# Patient Record
Sex: Female | Born: 1955
Health system: Southern US, Community
[De-identification: ages and names within clinical notes are randomized; demographics above are authoritative.]

## PROBLEM LIST (undated history)

## (undated) DIAGNOSIS — T4145XA Adverse effect of unspecified anesthetic, initial encounter: Secondary | ICD-10-CM

## (undated) DIAGNOSIS — E559 Vitamin D deficiency, unspecified: Secondary | ICD-10-CM

## (undated) DIAGNOSIS — Z9889 Other specified postprocedural states: Secondary | ICD-10-CM

## (undated) DIAGNOSIS — M1990 Unspecified osteoarthritis, unspecified site: Secondary | ICD-10-CM

## (undated) DIAGNOSIS — T8859XA Other complications of anesthesia, initial encounter: Secondary | ICD-10-CM

## (undated) DIAGNOSIS — R112 Nausea with vomiting, unspecified: Secondary | ICD-10-CM

## (undated) DIAGNOSIS — K219 Gastro-esophageal reflux disease without esophagitis: Secondary | ICD-10-CM

## (undated) DIAGNOSIS — D649 Anemia, unspecified: Secondary | ICD-10-CM

## (undated) DIAGNOSIS — B009 Herpesviral infection, unspecified: Secondary | ICD-10-CM

## (undated) DIAGNOSIS — I10 Essential (primary) hypertension: Secondary | ICD-10-CM

## (undated) HISTORY — PX: OVARY SURGERY: SHX727

## (undated) HISTORY — DX: Vitamin D deficiency, unspecified: E55.9

## (undated) HISTORY — PX: ABDOMINAL HYSTERECTOMY: SHX81

## (undated) HISTORY — PX: EYE SURGERY: SHX253

## (undated) HISTORY — PX: REFRACTIVE SURGERY: SHX103

## (undated) HISTORY — DX: Herpesviral infection, unspecified: B00.9

## (undated) HISTORY — DX: Anemia, unspecified: D64.9

---

## 1988-10-16 DIAGNOSIS — E039 Hypothyroidism, unspecified: Secondary | ICD-10-CM

## 1988-10-16 HISTORY — DX: Hypothyroidism, unspecified: E03.9

## 1998-09-23 ENCOUNTER — Other Ambulatory Visit: Admission: RE | Admit: 1998-09-23 | Discharge: 1998-09-23 | Payer: Self-pay | Admitting: Gynecology

## 1998-10-05 ENCOUNTER — Ambulatory Visit (HOSPITAL_COMMUNITY): Admission: RE | Admit: 1998-10-05 | Discharge: 1998-10-05 | Payer: Self-pay | Admitting: *Deleted

## 1998-10-26 ENCOUNTER — Ambulatory Visit (HOSPITAL_COMMUNITY): Admission: RE | Admit: 1998-10-26 | Discharge: 1998-10-26 | Payer: Self-pay | Admitting: *Deleted

## 1999-05-25 ENCOUNTER — Ambulatory Visit (HOSPITAL_COMMUNITY): Admission: RE | Admit: 1999-05-25 | Discharge: 1999-05-25 | Payer: Self-pay | Admitting: *Deleted

## 1999-06-08 ENCOUNTER — Other Ambulatory Visit: Admission: RE | Admit: 1999-06-08 | Discharge: 1999-06-08 | Payer: Self-pay | Admitting: Gynecology

## 1999-12-09 ENCOUNTER — Ambulatory Visit (HOSPITAL_COMMUNITY): Admission: RE | Admit: 1999-12-09 | Discharge: 1999-12-09 | Payer: Self-pay | Admitting: Gynecology

## 1999-12-09 ENCOUNTER — Encounter: Payer: Self-pay | Admitting: Gynecology

## 2000-01-24 ENCOUNTER — Other Ambulatory Visit: Admission: RE | Admit: 2000-01-24 | Discharge: 2000-01-24 | Payer: Self-pay | Admitting: Gynecology

## 2000-02-15 ENCOUNTER — Ambulatory Visit (HOSPITAL_COMMUNITY): Admission: RE | Admit: 2000-02-15 | Discharge: 2000-02-15 | Payer: Self-pay | Admitting: Gynecology

## 2000-02-15 ENCOUNTER — Encounter (INDEPENDENT_AMBULATORY_CARE_PROVIDER_SITE_OTHER): Payer: Self-pay

## 2000-12-12 ENCOUNTER — Ambulatory Visit (HOSPITAL_COMMUNITY): Admission: RE | Admit: 2000-12-12 | Discharge: 2000-12-12 | Payer: Self-pay | Admitting: Obstetrics and Gynecology

## 2000-12-12 ENCOUNTER — Encounter: Payer: Self-pay | Admitting: Obstetrics and Gynecology

## 2001-04-15 ENCOUNTER — Other Ambulatory Visit: Admission: RE | Admit: 2001-04-15 | Discharge: 2001-04-15 | Payer: Self-pay | Admitting: Obstetrics and Gynecology

## 2001-12-17 ENCOUNTER — Ambulatory Visit (HOSPITAL_COMMUNITY): Admission: RE | Admit: 2001-12-17 | Discharge: 2001-12-17 | Payer: Self-pay | Admitting: Obstetrics and Gynecology

## 2001-12-17 ENCOUNTER — Encounter: Payer: Self-pay | Admitting: Obstetrics and Gynecology

## 2002-04-15 ENCOUNTER — Other Ambulatory Visit: Admission: RE | Admit: 2002-04-15 | Discharge: 2002-04-15 | Payer: Self-pay | Admitting: Obstetrics and Gynecology

## 2002-09-08 ENCOUNTER — Ambulatory Visit (HOSPITAL_COMMUNITY): Admission: RE | Admit: 2002-09-08 | Discharge: 2002-09-08 | Payer: Self-pay | Admitting: Internal Medicine

## 2002-12-24 ENCOUNTER — Encounter: Payer: Self-pay | Admitting: Obstetrics and Gynecology

## 2002-12-24 ENCOUNTER — Ambulatory Visit (HOSPITAL_COMMUNITY): Admission: RE | Admit: 2002-12-24 | Discharge: 2002-12-24 | Payer: Self-pay | Admitting: Obstetrics and Gynecology

## 2003-06-16 ENCOUNTER — Other Ambulatory Visit: Admission: RE | Admit: 2003-06-16 | Discharge: 2003-06-16 | Payer: Self-pay | Admitting: Obstetrics and Gynecology

## 2003-11-05 ENCOUNTER — Ambulatory Visit (HOSPITAL_COMMUNITY): Admission: RE | Admit: 2003-11-05 | Discharge: 2003-11-05 | Payer: Self-pay

## 2003-12-25 ENCOUNTER — Ambulatory Visit (HOSPITAL_COMMUNITY): Admission: RE | Admit: 2003-12-25 | Discharge: 2003-12-25 | Payer: Self-pay | Admitting: Obstetrics and Gynecology

## 2004-01-26 ENCOUNTER — Encounter (INDEPENDENT_AMBULATORY_CARE_PROVIDER_SITE_OTHER): Payer: Self-pay | Admitting: *Deleted

## 2004-01-26 ENCOUNTER — Observation Stay (HOSPITAL_COMMUNITY): Admission: RE | Admit: 2004-01-26 | Discharge: 2004-01-27 | Payer: Self-pay | Admitting: Obstetrics and Gynecology

## 2004-09-21 ENCOUNTER — Encounter: Payer: Self-pay | Admitting: Orthopedic Surgery

## 2004-09-24 ENCOUNTER — Ambulatory Visit (HOSPITAL_COMMUNITY): Admission: RE | Admit: 2004-09-24 | Discharge: 2004-09-24 | Payer: Self-pay | Admitting: Orthopedic Surgery

## 2004-12-02 ENCOUNTER — Ambulatory Visit: Payer: Self-pay

## 2004-12-06 ENCOUNTER — Ambulatory Visit: Payer: Self-pay | Admitting: Cardiology

## 2004-12-19 ENCOUNTER — Ambulatory Visit: Payer: Self-pay | Admitting: Cardiology

## 2004-12-29 ENCOUNTER — Ambulatory Visit (HOSPITAL_COMMUNITY): Admission: RE | Admit: 2004-12-29 | Discharge: 2004-12-29 | Payer: Self-pay | Admitting: Obstetrics and Gynecology

## 2004-12-30 ENCOUNTER — Ambulatory Visit: Payer: Self-pay | Admitting: Cardiology

## 2005-01-09 ENCOUNTER — Encounter: Admission: RE | Admit: 2005-01-09 | Discharge: 2005-01-09 | Payer: Self-pay | Admitting: Obstetrics and Gynecology

## 2005-03-21 ENCOUNTER — Ambulatory Visit: Payer: Self-pay | Admitting: Cardiology

## 2006-01-29 ENCOUNTER — Ambulatory Visit (HOSPITAL_COMMUNITY): Admission: RE | Admit: 2006-01-29 | Discharge: 2006-01-29 | Payer: Self-pay | Admitting: Obstetrics and Gynecology

## 2006-02-12 ENCOUNTER — Ambulatory Visit (HOSPITAL_COMMUNITY): Admission: RE | Admit: 2006-02-12 | Discharge: 2006-02-12 | Payer: Self-pay | Admitting: Gastroenterology

## 2006-02-12 LAB — HM COLONOSCOPY

## 2006-12-07 ENCOUNTER — Encounter: Admission: RE | Admit: 2006-12-07 | Discharge: 2006-12-07 | Payer: Self-pay | Admitting: Family Medicine

## 2007-02-15 ENCOUNTER — Ambulatory Visit (HOSPITAL_COMMUNITY): Admission: RE | Admit: 2007-02-15 | Discharge: 2007-02-15 | Payer: Self-pay | Admitting: Obstetrics and Gynecology

## 2008-02-18 ENCOUNTER — Ambulatory Visit (HOSPITAL_COMMUNITY): Admission: RE | Admit: 2008-02-18 | Discharge: 2008-02-18 | Payer: Self-pay | Admitting: Obstetrics and Gynecology

## 2009-12-28 ENCOUNTER — Ambulatory Visit (HOSPITAL_COMMUNITY): Admission: RE | Admit: 2009-12-28 | Discharge: 2009-12-28 | Payer: Self-pay | Admitting: Gastroenterology

## 2009-12-28 LAB — HM COLONOSCOPY

## 2011-01-02 ENCOUNTER — Inpatient Hospital Stay (INDEPENDENT_AMBULATORY_CARE_PROVIDER_SITE_OTHER)
Admission: RE | Admit: 2011-01-02 | Discharge: 2011-01-02 | Disposition: A | Discharge status: Home / Self Care | Payer: Commercial Managed Care - PPO | Source: Ambulatory Visit | Attending: Emergency Medicine | Admitting: Emergency Medicine

## 2011-01-02 DIAGNOSIS — J40 Bronchitis, not specified as acute or chronic: Secondary | ICD-10-CM

## 2011-01-02 DIAGNOSIS — I10 Essential (primary) hypertension: Secondary | ICD-10-CM

## 2011-01-02 DIAGNOSIS — K5289 Other specified noninfective gastroenteritis and colitis: Secondary | ICD-10-CM

## 2011-03-03 NOTE — Op Note (Signed)
Robin Greene, Robin Greene                            ACCOUNT NO.:  [id number]   MEDICAL RECORD NO.:  [id number]                   PATIENT TYPE:  OBV   LOCATION:  9399                                 FACILITY:  [facility]   PHYSICIAN:  Guy Sandifer. Arleta Creek, M.D.           DATE OF BIRTH:  1956-03-10   DATE OF PROCEDURE:  01/26/2004  DATE OF DISCHARGE:                                 OPERATIVE REPORT   PREOPERATIVE DIAGNOSIS:  Stress urinary incontinence.   POSTOPERATIVE DIAGNOSIS:  Stress urinary incontinence.   PROCEDURES:  1. Pelvilace midurethral sling.  2. Cystoscopy.   SURGEON:  Guy Sandifer. Henderson Cloud, M.D.   ASSISTANT:  Juluis Mire, M.D.   ESTIMATED BLOOD LOSS:  Less than 50 mL.   SPECIMENS:  None.   INDICATIONS AND CONSENT:  This patient is a 55 year old white female with  stress urinary incontinence.  Pelvilace midurethral sling has been discussed  with the patient preoperatively.  The potential risks and complications are  reviewed, including but not limited to infection, organ damage, bleeding  requiring transfusion of blood products with possible transfusion reaction,  HIV and hepatitis acquisition, DVT, PE, pneumonia, recurrent stress  incontinence, prolonged postoperative catheterization and/or prolonged  intermittent self-catheterization, and other causes of incontinence.  All  questions have been answered and consent is signed on the chart.   PROCEDURE:  The patient is in the operating room under general anesthesia  via endotracheal intubation, having just undergone laparoscopically-assisted  vaginal hysterectomy with bilateral salpingo-oophorectomy with Dr. Richardean Chimera.  He completed his case.  The patient is in the dorsal lithotomy  position.  Weighted speculum is placed.  The anterior vaginal mucosa is  grasped under the course of the midurethra, which is injected with 0.5%  lidocaine with epinephrine at 1:200,000 concentration.  The vaginal mucosa  is then incised under  the course of the urethra and dissection is carried  out bilaterally to the point of the urogenital diaphragm and the inferior  pubic ramus bilaterally.  The suprapubic midline is marked with a marking  pen and two incisions are made just above the pubis, one fingerbreadth  lateral to the midline bilaterally.  Then using the Bard needle, the  anterior fascia is penetrated about 1 cm superior to the pubic ramus.  The  needle is then rotated at a 90 degree angle and the back of the pubic ramus  is gently palpated with the tip of the needle while pushing the needle  downward toward the floor until the inferior aspect of the pubic ramus is  located and the needle is then passed under the ramus.  Using the vaginal  finger to guide the needle tip, it is exited through the urogenital  diaphragm lateral to the urethra.  It should be noted that a Foley catheter  is in the bladder prior to placement of the needle as to drain the bladder  and  also to mark the course of the urethra.  A similar procedure is carried  out on the right side.  Cystoscopy is then carried out using the 70 degree  angled cystoscope.  A 360 degree inspection reveals no evidence of bladder  perforation.  There are no foreign bodies in the bladder and a puff is seen  from the ureteral orifices bilaterally.  The cystoscope is removed and the  Foley catheter is replaced.  The Pelvilace sling is then placed on the  needles bilaterally and withdrawn through the suprapubic incisions.  The  sling is placed so that a Kelly clamp can be easily placed under the sling  and rotated perpendicular to the floor without undue tension on the sling,  and a small amount of space is noted between the urethra and the sling.  Excess Pelvilace is then trimmed above the level of the skin bilaterally.  The Foley catheter is again removed.  The cystoscope is placed.  A small  suprapubic midline stab incision is made and a Bonnano catheter is placed   under direct visualization without difficulty.  The stylet is removed and  good positioning of the suprapubic is noted.  The bladder is drained with a  Foley while the suprapubic is stitched in place with a permanent suture.  The vaginal incision is closed in a running locking fashion with 2-0 Rapide  suture.  Dermabond is used to close the suprapubic incision.  All counts  correct.  The patient is stable and taken to the recovery room in stable  condition.                                               Guy Sandifer Arleta Creek, M.D.    JET/MEDQ  D:  01/26/2004  T:  01/26/2004  Job:  [id number]

## 2011-03-03 NOTE — Op Note (Signed)
NAMEZILLA, SHARTZER NO.:  [id number]   MEDICAL RECORD NO.:  [id number]          PATIENT TYPE:  OIB   LOCATION:  2899                         FACILITY:  [facility]   PHYSICIAN:  Dionne Ano. Gramig III, M.D.DATE OF BIRTH:  1956-05-19   DATE OF PROCEDURE:  09/24/2004  DATE OF DISCHARGE:  09/24/2004                                 OPERATIVE REPORT   PREOPERATIVE DIAGNOSIS:  Chronic stenosing tenosynovitis, left first dorsal  compartment.   POSTOPERATIVE DIAGNOSIS:  Chronic stenosing tenosynovitis, left first dorsal  compartment.   OPERATION PERFORMED:  1.  Left first dorsal compartment release.  2.  Abductor pollicis longus and extensor pollicis brevis tenosynovectomy.   SURGEON:  Dionne Ano. Amanda Pea, M.D.   ASSISTANT:  None.   ANESTHESIA:  Local with intravenous sedation.   COMPLICATIONS:  None.   TOURNIQUET TIME:  Less than 30 minutes.   INDICATIONS FOR PROCEDURE:  Ms. Yetta Barre is a very pleasant female who presents  with the above mentioned diagnosis.  I have counseled her in regard to the  risks and benefits of surgery including risks of bleeding, infection,  anesthesia, damage to normal structures and failure of surgery to accomplish  its intended goals of relieving symptoms and restoring function.  She has  failed conservative management in the form of injections, splinting, etc.  and desires to proceed.   DESCRIPTION OF PROCEDURE:  The patient was seen by myself and anesthesia,  taken to the operative suite, underwent a smooth induction of intravenous  sedation and following this, a field block was placed.  Once this was done,  the patient then underwent a thorough prep and drape a second time with  Betadine scrub and paint.  Preoperative Ancef was given.  The arm was  elevated.  Tourniquet was insufflated to 250 mmHg.  An incision was made  less than an inch about the area proximal to the radial styloid dorsal  radially.  Dissection was carried down.  The  superficial radial nerve was  protected and the first dorsal compartment was released off of the dorsal  edge to prevent volar subluxation.  She had a separate compartment for the  abductor pollicis longus tendon and this was released as well decompressing  all of the tendons.  Following this, I then performed a tenosynovectomy  meticulously of the extensor pollicis brevis and the abductor pollicis  longus tendon.  She had marked tenosynovitis which was removed to my  satisfaction.  Following this, I then placed the arm through a range of  motion.  She looked excellent.  There was no locking, popping, catching or  volar subluxation of the tendons. I irrigated copiously with the tourniquet  down, obtained, hemostasis and closed the wound with a subcuticular Prolene  suture.  Steri-Strips were applied and a sterile dressing and thumb spica  split were placed.  The patient tolerated the procedure well.  She  was transferred to recovery room in stable condition.  She will be  discharged home on Vicodin and Robaxin and return to see me in six days and  notify me  if any problems occur.  It has been a pleasure to participate in  the patient's care and we look forward to participating in her operative  recovery.       WMG/MEDQ  D:  09/24/2004  T:  09/25/2004  Job:  [id number]

## 2011-03-03 NOTE — Op Note (Signed)
Robin Greene, Robin Greene                            ACCOUNT NO.:  [id number]   MEDICAL RECORD NO.:  [id number]                   PATIENT TYPE:  AMB   LOCATION:  SDC                                  FACILITY:  [facility]   PHYSICIAN:  Juluis Mire, M.D.                DATE OF BIRTH:  1956-09-19   DATE OF PROCEDURE:  11/05/2003  DATE OF DISCHARGE:                                 OPERATIVE REPORT   PREOPERATIVE DIAGNOSIS:  History of complex endometrial hyperplasia with  abnormal bleeding.   POSTOPERATIVE DIAGNOSIS:  History of complex endometrial hyperplasia with  abnormal bleeding, with evidence of extreme cervical stenosis.   OPERATIVE PROCEDURES:  Cervical dilatation with attempted hysteroscopy.   ANESTHESIA:  General.   ESTIMATED BLOOD LOSS:  Minimal.   PACKS AND DRAINS:  None.   INTRAOPERATIVE BLOOD REPLACED:  None.   COMPLICATIONS:  None.   INDICATIONS:  Dictated in history and physical.   The procedure is as follows:  The patient was taken to the OR and placed in  the supine position. After a satisfactory level of general endotracheal  anesthesia obtained, the patient was placed in the dorsal lithotomy position  using the Allen stirrups.  The perineum and vagina were cleansed out with  Betadine and draped in a sterile field.  A speculum was placed in the  vaginal vault. the cervix was grasped with a single-tooth tenaculum.  A  paracervical block was instituted using 1% Xylocaine.  Attempt at sounding  the uterus was unsuccessful due to the stenotic cervix.  We therefore  proceeded to dilate the ectocervical opening and insert a diagnostic  hysteroscope, trying to identify the cavity.  We did palpate the uterus.  It  was mid- to anterior in position.  Despite the hysteroscopic evaluation, we  could not find a passage.  We attempted probing with the smaller dilators  and the sound, eventually identified what was felt to be a passage, but  repeat hysteroscopy revealed a false  passage.  We were unable to enter the  intrauterine cavity safely.  At this point in time we discontinued the  procedure.  The speculum and single-tooth tenaculum were removed.  The  patient was taken out of the dorsal lithotomy position, once alert and  extubated transferred to the recovery room in good condition.  Sponge,  instrument, and needle count reported as correct by the circulating nurse.                                               Juluis Mire, M.D.    JSM/MEDQ  D:  11/05/2003  T:  11/05/2003  Job:  [id number]

## 2011-03-03 NOTE — Discharge Summary (Signed)
Robin Greene, Robin Greene                            ACCOUNT NO.:  [id number]   MEDICAL RECORD NO.:  [id number]                   PATIENT TYPE:  OBV   LOCATION:  9316                                 FACILITY:  [facility]   PHYSICIAN:  Juluis Mire, M.D.                DATE OF BIRTH:  August 30, 1956   DATE OF ADMISSION:  01/26/2004  DATE OF DISCHARGE:                                 DISCHARGE SUMMARY   ADMITTING DIAGNOSES:  1. History of complex endometrial hyperplasia.  2. Abnormal uterine bleeding.  3. Cervical stenosis.  4. Stress urinary incontinence.   POSTOPERATIVE DIAGNOSES:  1. History of complex endometrial hyperplasia.  2. Abnormal uterine bleeding.  3. Cervical stenosis.  4. Stress urinary incontinence.   OPERATIVE PROCEDURE:  1. Laparoscopic-assisted vaginal hysterectomy with bilateral salpingo-     oophorectomy.  2. Midurethral sling.   For her complete history and physical please see dictated note.   COURSE IN THE HOSPITAL:  The patient underwent above-noted surgery,  postoperatively did well.  Postoperative hemoglobin was 12.5.  Discharged  home on postoperative day  #1.  At that time we had done clamping of the suprapubic.  She voided well  with residuals of approximately 100 mL.  The suprapubic was removed.  She  was tolerating a regular diet and ambulating without difficulty, no active  vaginal bleeding.  The abdominal exam was benign and she was afebrile with  stable vital signs.   COMPLICATIONS:  None encountered during her stay in the hospital.  The  patient was discharged home in stable condition.   DISPOSITION:  The patient to watch for signs of infection, nausea and  vomiting, increasing abdominal pain, or active vaginal bleeding.  She is  instructed to call if she has difficulty voiding.  Discharged home on Tylox  as needed for pain.  She is to avoid heavy lifting, vaginal entrance, or  driving a car.  Reassess in the office in 1 week.                           Juluis Mire, M.D.    JSM/MEDQ  D:  01/27/2004  T:  01/27/2004  Job:  [id number]

## 2011-03-03 NOTE — Op Note (Signed)
NAMELEXIA, VANDEVENDER NO.:  [id number]   MEDICAL RECORD NO.:  [id number]          PATIENT TYPE:  AMB   LOCATION:  ENDO                         FACILITY:  [facility]   PHYSICIAN:  Bernette Redbird, M.D.   DATE OF BIRTH:  02/21/1956   DATE OF PROCEDURE:  02/12/2006  DATE OF DISCHARGE:                                 OPERATIVE REPORT   SURGEON:  Bernette Redbird, M.D.   PROCEDURE:  Upper endoscopy with Savary dilatation of the esophagus under  fluoroscopy.   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   FINDINGS:  Esophageal ring above 5 cm hiatal hernia, dilated to 18 mm.   DESCRIPTION OF PROCEDURE:  The nature, purpose, risks of the procedure have  been discussed with the patient who provided written consent.  Sedation was  Fentanyl 100 mcg and Versed 8 mg IV without arrhythmias or desaturation.  The Olympus small-caliber adult video endoscope was passed under direct  vision.  The vocal cords appeared normal.  The esophagus was readily  entered.  The esophageal mucosa was entirely normal, specifically without  evidence of reflux esophagitis, free reflux, Barrett's esophagus, varices,  infection or neoplasia.  At the squamocolumnar junction was a widely patent  esophageal mucosal ring which offered no resistance to passage of the scope.  The ring was at 35 cm and the diaphragmatic hiatus was at 40 cm, showing a 5-  cm hiatal hernia.  The stomach was normal without evidence of gastritis,  erosions, ulcers, polyps or masses; including a retroflexed view of the  cardia and the pylorus, duodenal bulb, and second duodenum looked normal.   Savary dilatation was then performed in the standard fashion.  The spring  tip guidewire was passed through the scope into the antrum of the stomach  and the scope was removed and in an exchange fashion, after which  fluoroscopy was used to confirm appropriate positioning of the wire.   A  single passage was made with an 18 mm Savary dilator, encountering mild  resistance at the level of the ring.  Fluoroscopy was used to confirm  passage; otherwise a portion of the dilator below the level of the  diaphragm.  The patient was then re-endoscoped under direct vision which  showed some fresh hemorrhage right at the ring, consistent with a fracture,  although there was no evidence of any severe mucosal disruption,  perforation, or active bleeding or undue trauma to the esophagus or  hypopharynx during removal the scope.  The patient tolerated the procedure  well and there no apparent complications.   IMPRESSION:  Esophageal ring dilated to 18 mm as described above, for  alleviation of dysphagia symptoms (530.3, 787.2).   PLAN:  Clinical followup of dysphagia symptoms.  Proceed to screening  colonoscopy.           ______________________________  Bernette Redbird, M.D.     RB/MEDQ  D:  02/12/2006  T:  02/12/2006  Job:  [id number]   cc:   Juluis Mire, M.D.  Fax: [phone]  Lavonda Jumbo, M.D.  Fax: [phone]

## 2011-03-03 NOTE — H&P (Signed)
NAME:  Robin Greene, Robin Greene                            ACCOUNT NO.:  [id number]   MEDICAL RECORD NO.:  [id number]                   PATIENT TYPE:  AMB   LOCATION:  SDC                                  FACILITY:  [facility]   PHYSICIAN:  Juluis Mire, M.D.                DATE OF BIRTH:  [birth year]   DATE OF ADMISSION:  11/05/2003  DATE OF DISCHARGE:                                HISTORY & PHYSICAL   HISTORY OF PRESENT ILLNESS:  The patient is a 55 year old gravida 2, para 0,  abortus 2 female who presents for a hysteroscopy.   In relation to the present admission, the patient has a history of  anovulatory cycling.  She had to discontinue birth control pills due to her  hypertension.  She had had a previous hysteroscopic evaluation by Dr. Dimas Aguas  C. Mezer.  They did find focal complex hyperplasia without atypia.  Subsequent ultrasound in 2001 had revealed minimal endometrial stripe and  thickness.  Due to a fibroid in the lower cervical area, endometrial  samplings in the office have been difficult and we have been unable to  perform that.  We went ahead and repeated a saline infusion ultrasound; she  did have a thickened endometrium.  Because of the cervical issues, we were  unable to pass a catheter and the patient now presents for a hysteroscopic  evaluation.   ALLERGIES:  In terms of allergies, she is allergic to ASPIRIN.   MEDICATIONS:  Medications include:  1. Synthroid 125 mcg each day.  2. Zyrtec 10 mg daily.  3. Lipitor 10 mg daily.   PAST MEDICAL HISTORY:  Past medical history does reveal a history of mild  hypertension.  She also has hyperlipidemia for which she is on medication,  as noted, otherwise, usual childhood diseases without any significant  sequelae.   PREVIOUS SURGICAL HISTORY:  In 1990, she had a miscarriage with D&C, in  1994,  she had a miscarriage with D&C and in 2001, she had a hysteroscopy  with D&C.   FAMILY HISTORY:  There is a history of diabetes in maternal  and paternal  grandparents.  Father has a history of hypertension.   SOCIAL HISTORY:  No tobacco or alcohol use.   REVIEW OF SYSTEMS:  Review of systems is noncontributory.   PHYSICAL EXAMINATION:  VITAL SIGNS:  The patient is afebrile with stable  vital signs.  HEENT:  The patient is normocephalic.  Pupils are equal, round and reactive  to light and accommodation.  Extraocular movements were intact.  Sclerae and  conjunctivae were clear.  Oropharynx was clear.  NECK:  Neck without thyromegaly.  BREASTS:  No discrete masses.  LUNGS:  Lungs clear.  CARDIOVASCULAR SYSTEM:  Regular rate with no murmurs or gallops.  ABDOMEN:  Abdominal exam is benign with no mass, organomegaly or tenderness.  PELVIC:  Normal external genitalia.  Vaginal mucosa is  clear.  Cervix  unremarkable.  Uterus normal size, shape and contour.  Adnexa free of mass  or tenderness.  EXTREMITIES:  Trace edema.  NEUROLOGIC:  Exam is grossly within normal limits.   BASIC IMPRESSION:  History of complex hyperplasia and anovulatory cycle,  rule out endometrial pathology.   PLAN:  The patient will undergo hysteroscopic evaluation of the endometrium  and biopsy.  The risks of surgery have been discussed including the risks of  infection, the risk of hemorrhage that could necessitate transfusion, the  risk of injury to adjacent organs including bladder or bowel that could  require further exploratory surgery, risks of deep venous thrombosis and  pulmonary embolus; patient expressed understanding of indications and risks.                                               Juluis Mire, M.D.    JSM/MEDQ  D:  11/05/2003  T:  11/05/2003  Job:  [id number]

## 2011-03-03 NOTE — Op Note (Signed)
Robin Greene, Robin Greene                            ACCOUNT NO.:  [id number]   MEDICAL RECORD NO.:  [id number]                   PATIENT TYPE:  OBV   LOCATION:  9316                                 FACILITY:  [facility]   PHYSICIAN:  Juluis Mire, M.D.                DATE OF BIRTH:  November 07, 1955   DATE OF PROCEDURE:  01/26/2004  DATE OF DISCHARGE:                                 OPERATIVE REPORT   PREOPERATIVE DIAGNOSES:  1. Abnormal uterine bleeding.  2. Complex cystic hyperplasia.  3. Cervical stenosis with inability to sample the uterine lining.  4. Stress urinary incontinence.   OPERATIVE PROCEDURE:  Laparoscopically-assisted vaginal hysterectomy with  bilateral salpingo-oophorectomy .   SURGEON:  Juluis Mire, M.D.   ASSISTANT:  Guy Sandifer. Henderson Cloud, M.D.   ANESTHESIA:  General endotracheal.   ESTIMATED BLOOD LOSS:  300-400 mL.   PACKS AND DRAINS:  None.   INTRAOPERATIVE BLOOD REPLACED:  None.   COMPLICATIONS:  None.   INDICATIONS:  Dictated in the history and physical.   The procedure was as follows:  The patient was taken to the OR and placed in  the supine position.  After a satisfactory level of general endotracheal  anesthesia obtained, the patient was placed in the dorsal lithotomy position  using Allen stirrups.  The abdomen, perineum, and vagina were prepped out  with Betadine.  The bladder was emptied by in-and-out catheterization.  A  Hulka tenaculum was put in place.  The patient then draped out for a  laparoscopy.  A subumbilical incision made with a knife.  The fascia was  identified and entered sharply.  The incision in the fascia extended  laterally.  The rectus muscles were separated in the midline.  The  peritoneum was entered sharply.  Palpation revealed no adhesions around the  umbilical opening.  The open laparoscopic trocar was put in place and  secured.  The laparoscope was introduced.  There was no evidence of injury  to adjacent organs.  The appendix was  normal.  Upper abdomen, including  liver and tip of the gallbladder, were normal.  Five millimeter trocars put  in place under direct visualization.  The uterus was elevated.  She did have  a small subserosal fibroid.  Tubes and ovaries were otherwise unremarkable  with no active pelvic processes noticed.  She did have one little implant of  endometriosis below the right uterosacral ligament.  First the right ovary  and tube were elevated.  The ovarian vasculature was identified.  The ureter  was identified and well below the ovarian vasculature.  Using the Gyrus  bipolar, the ovarian vasculature was cauterized and incised.  The mesenteric  attachments of the ovary were cauterized and incised.  The right round  ligament was cauterized and incised and part of the right broad ligament was  cauterized and incised.  We had good freeing of the  right side of the  uterus.  We then went to the left side, where the left tube and ovary were  elevated.  Again the ureter was identified.  The left ovarian vasculature  was isolated above the ureter, cauterized, and incised.  The mesenteric  attachments of the ovary were cauterized and incised.  The left round  ligament was cauterized and incised.  The upper part of the broad ligament  was cauterized and incised.  We had good separation bilaterally with no  active bleeding.   The abdomen was deflated of its carbon dioxide.  The laparoscope was  removed.  The patient's legs were repositioned.  The Hulka tenaculum was  then removed.  A weighted speculum was placed in the vaginal vault.  The  cervix was grasped with a Christella Hartigan tenaculum.  The cul-de-sac was entered  sharply.  Both uterosacral ligaments were clamped, cut, and suture ligated  with 0 Vicryl.  The reflection of the vaginal mucosa anteriorly was incised  using the bipolar.  The bladder was dissected superiorly.  Paracervical  tissue was clamped, cut, and suture ligated with 0 Vicryl.  The   vesicouterine space was identified and entered sharply.  The retractor was  put in place to retract the bladder superiorly.  Using the clamp, cut, and  tie technique with suture ligature of 0 Vicryl, the parametrium was serially  separated from the sides of the uterus.  The uterus was then flipped and  remaining pedicles were clamped and cut and the uterus, tubes, and ovaries  were passed off the operative field.  The held pedicles were secured with  free ties of 0 Vicryl.  We had some fairly active bleeding coming from the  left side of the vaginal cuff.  The vessel was isolated and brought under  control with two figure-of-eights of 0 Vicryl.  Similar bleeding was noted  on the posterior cuff, brought under control with a figure-of-eight of 0  Vicryl.  With this we had good hemostasis.  The vaginal mucosa was  reapproximated in the midline with interrupted figure-of-eights of 0 Vicryl.  We had good approximation.  All held sutures were cut.  The bladder was  emptied by straight drainage with retrieval of an adequate amount of clear  urine.   A sponge on a sponge stick was placed in the vaginal vault.  The patient's  legs were repositioned.  The abdomen was reinflated with carbon dioxide.  Visualization revealed excellent hemostasis.  There was no bleeding at the  cuff or either ovarian pedicle.  We irrigated the pelvis, irrigation  removed.  The abdomen was deflated of its carbon dioxide, all trocars  removed.  The subumbilical fascia was closed with two figure-of-eights of 0  Vicryl, skin with interrupted subcuticulars of 4-0 Vicryl.  The suprapubic  incision was closed with Dermabond.  Sponge, instrument, and needle count  reported as correct by the circulating nurse x2.  At this point in time Dr.  Henderson Cloud came in to complete the midurethral sling.                                               Juluis Mire, M.D.   JSM/MEDQ  D:  01/26/2004  T:  01/26/2004  Job:  [id number]

## 2011-03-03 NOTE — Op Note (Signed)
NAMEKAYSEA, RAYA NO.:  [id number]   MEDICAL RECORD NO.:  [id number]          PATIENT TYPE:  AMB   LOCATION:  ENDO                         FACILITY:  [facility]   PHYSICIAN:  Bernette Redbird, M.D.   DATE OF BIRTH:  [birth year]   DATE OF PROCEDURE:  02/12/2006  DATE OF DISCHARGE:                                 OPERATIVE REPORT   PROCEDURE:  Colonoscopy.   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   FINDINGS:  Normal exam to the terminal ileum.   PROCEDURE:  The nature, purpose, and risks of the procedure had been  discussed with the patient who provided written consent.  Sedation for this  procedure and the upper endoscopy which preceded it totaled fentanyl 125 mcg  and Versed 10 mg IV without arrhythmias or desaturation.  The Olympus adult  adjustable tension video colonoscope was advanced without significant  difficulty to the terminal ileum and pullback was then performed.  There was  some adherent stool in the proximal colon but this was able to be rinsed off  with complete cleaning of the mucosa so it is felt that, essentially, all  areas were well seen.   This was a normal examination.  No polyps, cancer, colitis, vascular  malformations, or diverticulosis were noted and retroflexion of the rectum;  and reinspection of the rectum were normal.  No biopsies were obtained.  The  patient tolerated the procedure well and there no apparent complications.   IMPRESSION:  Family history of colon polyps, without worrisome findings on  current examination.   PLAN:  Repeat colonoscopy in 5 years.           ______________________________  Bernette Redbird, M.D.     RB/MEDQ  D:  02/12/2006  T:  02/12/2006  Job:  [id number]   cc:   Juluis Mire, M.D.  Fax: [phone]   Lavonda Jumbo, M.D.  Fax: [phone]

## 2011-03-03 NOTE — H&P (Signed)
NAME:  Robin Greene, Robin Greene                            ACCOUNT NO.:  [id number]   MEDICAL RECORD NO.:  [id number]                   PATIENT TYPE:  OBV   LOCATION:  NA                                   FACILITY:  [facility]   PHYSICIAN:  Juluis Mire, M.D.                DATE OF BIRTH:  09/20/1956   DATE OF ADMISSION:  01/26/2004  DATE OF DISCHARGE:                                HISTORY & PHYSICAL   Patient is a 55 year old gravida 2, para 0, abortus 2 female presents for  laparoscopic assisted vaginal hysterectomy with bilateral salpingo-  oophorectomy as well as midurethral sling.   IN RELATION TO PRESENT ADMISSION:  Patient has a long-standing history of  anovulatory cycling.  She had discontinued birth control pills due to  hypertension.  She had her previous hysteroscopic evaluation by Dr. Chevis Pretty.  They did find complex hyperplasia but no atypia.  Subsequent ultrasound 2001  had revealed minimal endometrial stripe and thickness.  Due to a fibroid in  the lower cervical area endometrial sampling in the office has been  difficult.  We did do a saline infusion ultrasound that revealed thickened  endometrium.  Subsequently we had attempted hysteroscopy on November 05, 2003.  Despite numerous attempts we could not obtain cervical dilatation and  passage of the hysteroscope.  In view of her past history of complex  hyperplasia and inability to sample endometrium at the present time the  patient now presents for laparoscopic assisted vaginal hysterectomy with  bilateral salpingo-oophorectomy.   The patient also has urinary incontinence.  Workup in the office was  consistent with stress urinary incontinence.  She is going to have a  midurethral sling performed by Dr. Henderson Cloud.   ALLERGIES:  She is allergic to ASPIRIN.   MEDICATIONS:  1. Synthroid 125 mcg each day.  2. Zyrtec 10 mg a day.  3. Lipitor 10 mg daily.   PAST MEDICAL HISTORY:  Does reveal a history of mild hypertension.  She has  also hyperlipidemia for which she is on medication.  Also hypothyroidism.   PAST SURGICAL HISTORY:  In 1990 she had a miscarriage with D&C, in 1994 she  had a miscarriage with D&C, in 2001 she had a hysteroscopy and D&C and as  noted earlier this year attempted hysteroscopy.   FAMILY HISTORY:  She has a family history of a diabetes in maternal and  paternal grandparents.  Father has a history of hypertension.   SOCIAL HISTORY:  No tobacco or alcohol use.   REVIEW OF SYSTEMS:  Noncontributory.   PHYSICAL EXAMINATION:  VITALS:  Patient is afebrile with stable vital signs.  HEENT:  Patient normocephalic; pupils equal, round, and reactive to light  and accommodation; extraocular movements are intact; sclerae and  conjunctivae clear; oropharynx clear.  NECK:  Without thyromegaly.  BREASTS:  No discrete masses.  LUNGS:  Clear.  CARDIOVASCULAR SYSTEM:  Regular  rhythm and rate.  No murmurs or gallop.  ABDOMEN:  Benign.  No masses, organomegaly, or tenderness.  PELVIC:  Normal external genitalia; vaginal mucosa is clear; cervix  unremarkable; uterus feels to be of normal size, shape, and contour; adnexa  free of masses or tenderness; rectovaginal exam is clear.  EXTREMITIES:  Trace edema.  NEUROLOGIC:  Grossly within normal limits.   IMPRESSION:  1. Anovulatory bleeding with a history of complex hyperplasia with     associated cervical stenosis and unable to sample endometrium.     Associated continued abnormal bleeding.  2. Stress urinary incontinence.   PLAN:  The patient will undergo laparoscopic assisted vaginal hysterectomy  with bilateral salpingo-oophorectomy and midurethral sling.  The risks of  surgery have been discussed including the risk of infection.  The risk of  hemorrhage that could require transfusion.  The risk of AIDS or hepatitis.  Risk of injury to adjacent organs including bladder, bowel, ureters that  could require further exploratory surgery.  Risk of deep venous  thrombosis  and pulmonary embolus.  We are going to remove the ovaries, potential need  for hormone replacement therapy and its risks and benefits have been  discussed.  In terms of the midurethral sling success rates of 85-90% have  been discussed.  Possible need for long-term catheterizations has been  discussed.  There may be the need for taking down the sling somewhat to  provide ability to void.  Risks of sling include also possible bladder  perforation with associated fistula formation.                                               Juluis Mire, M.D.    JSM/MEDQ  D:  01/26/2004  T:  01/26/2004  Job:  [id number]

## 2011-03-16 ENCOUNTER — Other Ambulatory Visit (HOSPITAL_COMMUNITY): Payer: Self-pay | Admitting: Obstetrics and Gynecology

## 2011-03-16 DIAGNOSIS — Z1231 Encounter for screening mammogram for malignant neoplasm of breast: Secondary | ICD-10-CM

## 2011-03-23 ENCOUNTER — Encounter: Payer: 59 | Attending: Internal Medicine | Admitting: Dietician

## 2011-03-23 DIAGNOSIS — E119 Type 2 diabetes mellitus without complications: Secondary | ICD-10-CM | POA: Insufficient documentation

## 2011-03-23 DIAGNOSIS — Z713 Dietary counseling and surveillance: Secondary | ICD-10-CM | POA: Insufficient documentation

## 2011-03-23 DIAGNOSIS — I10 Essential (primary) hypertension: Secondary | ICD-10-CM | POA: Insufficient documentation

## 2011-03-23 DIAGNOSIS — E039 Hypothyroidism, unspecified: Secondary | ICD-10-CM | POA: Insufficient documentation

## 2011-03-27 ENCOUNTER — Ambulatory Visit (HOSPITAL_COMMUNITY)
Admission: RE | Admit: 2011-03-27 | Discharge: 2011-03-27 | Disposition: A | Discharge status: Home / Self Care | Payer: Commercial Managed Care - PPO | Source: Ambulatory Visit | Attending: Obstetrics and Gynecology | Admitting: Obstetrics and Gynecology

## 2011-03-27 DIAGNOSIS — Z1231 Encounter for screening mammogram for malignant neoplasm of breast: Secondary | ICD-10-CM | POA: Insufficient documentation

## 2011-12-03 ENCOUNTER — Emergency Department (HOSPITAL_COMMUNITY)
Admission: EM | Admit: 2011-12-03 | Discharge: 2011-12-03 | Disposition: A | Discharge status: Home / Self Care | Payer: 59 | Source: Home / Self Care | Attending: Family Medicine | Admitting: Family Medicine

## 2011-12-03 ENCOUNTER — Encounter (HOSPITAL_COMMUNITY): Payer: Self-pay

## 2011-12-03 DIAGNOSIS — R197 Diarrhea, unspecified: Secondary | ICD-10-CM

## 2011-12-03 HISTORY — DX: Essential (primary) hypertension: I10

## 2011-12-03 LAB — POCT I-STAT, CHEM 8
BUN: 15 mg/dL (ref 6–23)
HCT: 48 % — ABNORMAL HIGH (ref 36.0–46.0)
Potassium: 4.6 mEq/L (ref 3.5–5.1)
Sodium: 137 mEq/L (ref 135–145)
TCO2: 22 mmol/L (ref 0–100)

## 2011-12-03 MED ORDER — LOPERAMIDE HCL 2 MG PO CAPS: 2.0000 mg | ORAL_CAPSULE | Freq: Four times a day (QID) | ORAL | Status: AC | PRN

## 2011-12-03 MED ORDER — ONDANSETRON HCL 4 MG PO TABS: 4.0000 mg | ORAL_TABLET | Freq: Three times a day (TID) | ORAL | Status: AC | PRN

## 2011-12-03 MED ORDER — CIPROFLOXACIN HCL 500 MG PO TABS: 500.0000 mg | ORAL_TABLET | Freq: Two times a day (BID) | ORAL | Status: AC

## 2011-12-03 NOTE — Discharge Instructions (Signed)
Keep well hydrated using hydration salts mixed with water.  Take the prescribed medications as instructed. Advance your diet as tolerated as per instructions in handout Check your blood sugar daily. Can also take over-the-counter Prilosec 20 mg daily to protect her stomach until you get back to normal diet  Return to the emergency department if new symptoms like fever or worsening diarrhea associated with vomiting, abdominal pain or not keeping fluids down.

## 2011-12-03 NOTE — ED Notes (Signed)
5 day hx of diarrhea.  Right before she has diarrhea, pt. will have severe abdominal cramps.  Some nausea. Has 58 year old grandson that has had same symtoms.  Pt. Stated that when she burps it smells very bad.

## 2011-12-04 NOTE — ED Provider Notes (Signed)
History     CSN: [id number]  Arrival date & time 12/03/11  1054   First MD Initiated Contact with Patient 12/03/11 1221      Chief Complaint  Patient presents with  . Diarrhea    5 day hx of diarrhea.  Right before she has diarrhea, pt. will have severe abdominal cramps.  Some nausea. Has 58 year old grandson that has had same symtoms.      (Consider location/radiation/quality/duration/timing/severity/associated sxs/prior treatment) HPI Comments: 56 y/o female with h/o DM here c/o liquid diarrhea for 5 days about 10 per day liquid with no blood or pus, associated with cramps just before the bowel movements. Denies fever or rash, has felt nausea but no vomiting. 3 y/o grandson with similar symptoms. Had cramps in legs yesterday. Decreased solid intake drinking fluid well. No abdominal pain here. No dizziness.      Past Medical History  Diagnosis Date  . Hypertension   . Diabetes mellitus     Past Surgical History  Procedure Date  . Ovary surgery   . Abdominal hysterectomy     History reviewed. No pertinent family history.  History  Substance Use Topics  . Smoking status: Never Smoker   . Smokeless tobacco: Never Used  . Alcohol Use: Yes     occational wine    OB History    Grav Para Term Preterm Abortions TAB SAB Ect Mult Living                  Review of Systems  Constitutional: Negative for fever and chills.  HENT: Positive for congestion.   Respiratory: Positive for cough. Negative for chest tightness and shortness of breath.   Cardiovascular: Negative for chest pain, palpitations and leg swelling.  Gastrointestinal: Positive for vomiting and diarrhea. Negative for abdominal pain, constipation, blood in stool and anal bleeding.  Genitourinary: Negative for dysuria, frequency, hematuria, flank pain, vaginal bleeding, vaginal discharge and pelvic pain.  Musculoskeletal: Negative for arthralgias.  Skin: Negative for rash.  Neurological: Negative for dizziness  and headaches.    Allergies  Review of patient's allergies indicates no known allergies.  Home Medications   Current Outpatient Rx  Name Route Sig Dispense Refill  . ATORVASTATIN CALCIUM 20 MG PO TABS Oral Take 20 mg by mouth daily.    . OMEGA-3 FATTY ACIDS 1000 MG PO CAPS Oral Take 2 g by mouth daily.    Marland Kitchen GLUCOSE BLOOD VI STRP Subcutaneous Inject 1 each into the skin as needed. Use as instructed    . LEVOTHYROXINE SODIUM 200 MCG PO TABS Oral Take 200 mcg by mouth daily.    Marland Kitchen LISINOPRIL-HYDROCHLOROTHIAZIDE 20-25 MG PO TABS Oral Take 1 tablet by mouth daily.    Marland Kitchen METFORMIN HCL 500 MG PO TABS Oral Take 500 mg by mouth 2 (two) times daily with a meal.    . CIPROFLOXACIN HCL 500 MG PO TABS Oral Take 1 tablet (500 mg total) by mouth every 12 (twelve) hours. 14 tablet 0  . LOPERAMIDE HCL 2 MG PO CAPS Oral Take 1 capsule (2 mg total) by mouth 4 (four) times daily as needed for diarrhea or loose stools. 12 capsule 0  . ONDANSETRON HCL 4 MG PO TABS Oral Take 1 tablet (4 mg total) by mouth every 8 (eight) hours as needed for nausea. 10 tablet 0    BP 157/92  Pulse 105  Temp(Src) 97.6 F (36.4 C) (Oral)  Resp 18  SpO2 98%  Physical Exam  Nursing note  and vitals reviewed. Constitutional: She is oriented to person, place, and time. She appears well-developed and well-nourished. No distress.  HENT:  Head: Normocephalic and atraumatic.  Mouth/Throat: Oropharynx is clear and moist. No oropharyngeal exudate.       Dry lips  Eyes: Conjunctivae and EOM are normal. Pupils are equal, round, and reactive to light. No scleral icterus.  Neck: Neck supple.  Cardiovascular: Normal heart sounds and intact distal pulses.   Pulmonary/Chest: Breath sounds normal.  Abdominal: Soft. Bowel sounds are normal. She exhibits no distension and no mass. There is no rebound and no guarding.       diffused tenderness with palpation.   Lymphadenopathy:    She has no cervical adenopathy.  Neurological: She is alert  and oriented to person, place, and time.  Skin: Skin is warm. No rash noted.    ED Course  Procedures (including critical care time)  Labs Reviewed  POCT I-STAT, CHEM 8 - Abnormal; Notable for the following:    Glucose, Bld 162 (*)    Hemoglobin 16.3 (*)    HCT 48.0 (*)    All other components within normal limits  LAB REPORT - SCANNED   No results found.   1. Diarrhea       MDM  Diabetic with secretory type diarrhea for 5 days. Risk for dehydration. Likely viral although decided to treat with cipro, loperamide, ondansetron and encouraged hydration.        Sharin Grave, MD 12/05/11 1429

## 2012-04-04 ENCOUNTER — Other Ambulatory Visit (HOSPITAL_COMMUNITY): Payer: Self-pay | Admitting: Obstetrics and Gynecology

## 2012-04-04 DIAGNOSIS — Z1231 Encounter for screening mammogram for malignant neoplasm of breast: Secondary | ICD-10-CM

## 2012-04-29 ENCOUNTER — Ambulatory Visit (HOSPITAL_COMMUNITY)
Admission: RE | Admit: 2012-04-29 | Discharge: 2012-04-29 | Disposition: A | Discharge status: Home / Self Care | Payer: 59 | Source: Ambulatory Visit | Attending: Obstetrics and Gynecology | Admitting: Obstetrics and Gynecology

## 2012-04-29 DIAGNOSIS — Z1231 Encounter for screening mammogram for malignant neoplasm of breast: Secondary | ICD-10-CM | POA: Insufficient documentation

## 2012-11-22 ENCOUNTER — Ambulatory Visit (INDEPENDENT_AMBULATORY_CARE_PROVIDER_SITE_OTHER): Payer: Self-pay | Admitting: Family Medicine

## 2012-11-22 DIAGNOSIS — E119 Type 2 diabetes mellitus without complications: Secondary | ICD-10-CM

## 2012-11-22 NOTE — Progress Notes (Signed)
Patient presents today for 3 month DM follow-up as part of the employer-sponsored Link to Wellness program. Medications and glucose readings have been reviewed. I have also discussed with patient lifestyle interventions such as healthy eating choices and physical activity. Details of this visit can be found in Care Tracker documenting program available through [facility] ([facility]). Patient has set a series of personal goals and will follow-up in 3 months for further review of DM.  

## 2012-12-09 DIAGNOSIS — E119 Type 2 diabetes mellitus without complications: Secondary | ICD-10-CM | POA: Insufficient documentation

## 2012-12-09 NOTE — Progress Notes (Signed)
ATTENDING PHYSICIAN NOTE: I have reviewed the chart and agree with the plan as detailed above. Prestyn Stanco MD Pager [phone]  

## 2013-02-07 ENCOUNTER — Ambulatory Visit (INDEPENDENT_AMBULATORY_CARE_PROVIDER_SITE_OTHER): Payer: Self-pay | Admitting: Family Medicine

## 2013-02-07 VITALS — BP 151/86 | HR 79 | Ht 65.0 in | Wt 193.0 lb

## 2013-02-07 DIAGNOSIS — E1169 Type 2 diabetes mellitus with other specified complication: Secondary | ICD-10-CM | POA: Insufficient documentation

## 2013-02-07 DIAGNOSIS — E119 Type 2 diabetes mellitus without complications: Secondary | ICD-10-CM
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
## 2013-02-17 NOTE — Progress Notes (Signed)
Patient ID: Robin Greene, female   DOB: 26-Aug-1956, 57 y.o.   MRN: [id number] ATTENDING PHYSICIAN NOTE: I have reviewed the chart and agree with the plan as detailed above. Denny Levy MD Pager [phone]

## 2013-04-30 ENCOUNTER — Other Ambulatory Visit (HOSPITAL_COMMUNITY): Payer: Self-pay | Admitting: Obstetrics and Gynecology

## 2013-04-30 DIAGNOSIS — Z1231 Encounter for screening mammogram for malignant neoplasm of breast: Secondary | ICD-10-CM

## 2013-05-06 ENCOUNTER — Ambulatory Visit (HOSPITAL_COMMUNITY)
Admission: RE | Admit: 2013-05-06 | Discharge: 2013-05-06 | Disposition: A | Discharge status: Home / Self Care | Payer: 59 | Source: Ambulatory Visit | Attending: Obstetrics and Gynecology | Admitting: Obstetrics and Gynecology

## 2013-05-06 DIAGNOSIS — Z1231 Encounter for screening mammogram for malignant neoplasm of breast: Secondary | ICD-10-CM | POA: Insufficient documentation

## 2013-06-06 ENCOUNTER — Ambulatory Visit (INDEPENDENT_AMBULATORY_CARE_PROVIDER_SITE_OTHER): Payer: Self-pay | Admitting: Family Medicine

## 2013-06-06 VITALS — BP 130/76 | HR 83 | Ht 65.0 in | Wt 190.8 lb

## 2013-06-06 DIAGNOSIS — E119 Type 2 diabetes mellitus without complications: Secondary | ICD-10-CM

## 2013-06-06 NOTE — Progress Notes (Signed)
Subjective:  Patient presents today for 3 month diabetes follow-up as part of the employer-sponsored Link to Wellness program. Current diabetes regimen includes Lantus 40 units once daily, metformin 1000 mg ER twice daily, Victoza 1.8 mg SQ once daily. Patient also continues on daily ACEi, and statin. Most recent MD follow-up was in June with Dr. Sharl Ma. Patient has a pending appt for with Dr. Sharl Ma in 2 weeks. No med changes or major health changes at this time.   Eye appointment September 8th pending.    Disease Assessments:  Diabetes:  Type of Diabetes: Type 2; Year of diagnosis 2008; Sees Diabetes provider 4 or more times per year; Diabetes Education [employer] DM classes; MD managing Diabetes Talmage Coin; checks feet daily; uses glucometer; takes medications as prescribed; does not take an aspirin a day; hypoglycemia frequency never; checks blood glucose once daily;   Lowest CBG 106; Highest CBG 179; 7 day CBG average 127; 14 day CBG average 132; 30 day CBG average 147;   Other Diabetes History: Patient has increased and is now checking once daily fasting in the morning.   She is on a titration for Lantus- increasing 2 units every 2 day for FBG>120. For the past 10 days she has leveled off at 40 units of Lantus. FBG have been around 130-140 for the past week. She has an appointment to see Dr. Sharl Ma in 2 weeks. She asked me if I thought she needed to continue increasing her Lantus. I told her that based on her FBG she is approaching her goal of FBG<120 but isn't quite there yet. I told her that when she has her A1C checked in 2 weeks she will have a better picture on whether or not she needs to be more aggressive with Lantus dosing. For the time being she agreed to go to up 42 units Lantus until she goes back to Dr. Sharl Ma.     Physical Activity-  Walking on the track at her church (8 laps = 1 mile). She states that she is walking 1.5-2 miles. She is spending 30-40 minutes to walk. She walks every day that  it is not raining- she is getting at least 4-5 days a week, sometimes on the weekend.   Nutrition-   She is on weight watchers and currently has 26 points daily. She states that she is staying within her weekly points. She reports that she has given up bread. She estimates she has lost about 5 pounds since she started.   She reports that she isn't as hungry as she used to be. But she does get grumpy when it's time to eat.    Bone Density: 05/06/2013    Dilated Eye Exam: 05/10/2012  Flexible Sigmoidoscopy: 12/08/2010  Last mammography: 05/06/2013  Last Endocrinologist Visit 03/31/2013       Vital Signs:  06/06/2013 9:30 AM (EST) Blood Pressure 130 / 76 mm/HgBMI 31.3; Height 5 ft 5.5 in; Pulse Rate 83 bpm; Weight 190.8 lbs    Testing:  Blood Sugar Tests:  Hemoglobin A1c: 7.8    Assessment/Plan: Patient is a 57 year old female with DM2. Most recent A1C was elevated at 7.8%. Because of her pending appointment with Dr. Sharl Ma I will defer A1C testing to his office. CBG readings seem to be improved since last visit and addition of Lantus in June. Patient has also managed to lose 3 pounds since her last appointment with me. I congratulated patient on her success and told her that especially with starting insulin it  was a great accomplishment that she could lose weight.   Patient attributes weight loss to increased physical activity (she is now walking almost daily for 1-2 miles at a local track). She has also started weight watchers and is sticking to her daily point total. She has limited carbohydrate intake.   Lantus dose may need to be increased, depending on A1C results in 2 weeks time. Fasting BG still are slightly elevated and are running between 130-140.   Follow up with patient in 3 months.   .    Goals for Weight Loss-  1. Continue following weight watchers to aid in weight loss.  2. Continue physical activity.   Next appointment to see me is Friday November 21st at 8:30 AM.

## 2013-09-05 ENCOUNTER — Ambulatory Visit (INDEPENDENT_AMBULATORY_CARE_PROVIDER_SITE_OTHER): Payer: Self-pay | Admitting: Family Medicine

## 2013-09-05 VITALS — BP 134/70 | HR 80 | Ht 65.5 in | Wt 193.2 lb

## 2013-09-05 DIAGNOSIS — E119 Type 2 diabetes mellitus without complications: Secondary | ICD-10-CM

## 2013-09-05 NOTE — Progress Notes (Signed)
Subjective:  Patient presents today for 3 month diabetes follow-up as part of the employer-sponsored Link to Wellness program. Current diabetes regimen includes Lantus 48 units once daily, Victoza 1.8 mg SQ once daily and metformin 1000 mg BID. Patient also continues on daily ACEi, and statin.   She has a pending appointment with Dr. Daune Perch office in January. She was supposed to see Dr. Sharl Ma in the fall but her appointment was cancelled when he office closed and moved into another [place] office.  She reports things have been going so-so. She states that she needs to make changes in her house because her husband also has diabetes. He has a CDL license and if he can't get his A1C less than 8% by his next appointment in 6 weeks he will lose his CDL license.    Disease Assessments:  Diabetes:  Type of Diabetes: Type 2; Year of diagnosis 2008; Sees Diabetes provider 4 or more times per year; Diabetes Education [employer] DM classes; MD managing Diabetes Talmage Coin; checks feet daily; uses glucometer; takes medications as prescribed; does not take an aspirin a day; hypoglycemia frequency never; checks blood glucose once daily; Lowest CBG 106; Highest CBG 179; 7 day CBG average 127; 14 day CBG average 132; 30 day CBG average 147; Other Diabetes History: Patient states that she is checking her CBG once daily fasting in the morning.   She is still somewhat in the middle of a Lantus titration. She recently 3 weeks ago increased her dose to Lantus to 48 units once daily.   She denies hypoglycemia.     Physical Activity-  She states that since it has gotten colder she hasn't been walking. She states that she wants to move the treadmill into the den so that she can watch TV while she is walking.   Nutrition-   She reports that she is still enrolled in Weight Watchers but hasn't gone to the meetings. She is getting a new phone and she thinks that she will be able to use the online version. It seems likes she is  still somewhat following the diet plan but isn't very diligent with tracking things.    Dilated Eye Exam: 07/16/2013  Flexible Sigmoidoscopy: 12/08/2010  Flu vaccine: 06/16/2013  Last mammography: 05/06/2013  Other Preventive Care Notes:  Dental Visit- July 2014  Last Endocrinologist Visit 03/31/2013       Overall Health Assessments:  Vision:  Dilated Eye Exam: 07/16/2013      Vital Signs:  09/05/2013 11:28 AM (EST) Blood Pressure 134 / 70 mm/HgBMI 31.7; Height 5 ft 5.5 in; Pulse Rate 80 bpm; Weight 193.2 lbs    Testing:  Blood Sugar Tests:  Hemoglobin A1c: 6.8 resulted on 09/05/2013    Objective:  Musculoskeletal: feet and toes normal.    Assessment/Plan: Patient is a 57 year old female with DM2. A1C today is 6.8% and is meeting goal of less than 7%. Patient has been following a Lantus titration and she is currently taking 48 units once daily. She is also taking Victoza 1.8 mg SQ daily and Metformin 1000 mg BID. She reports that she is checking CBG once daily, but when I downloaded patient's meter she is only checking 2-3 times a week. I encouraged her to check daily so that she can continue Lantus titration if it is needed.   Patient reports that she has slackened off on following the weight watchers plan and also hasn't been as diligent with physical activity. We discussed ways for her to  make using weight watchers easier. She is getting a new phone in the next couple of weeks and I talked to her about using the weight watchers app and the weight watchers kitchen app. She also states that she has a friend that is going to sign up for weight watchers with her and this will help her to be motivated.    Patient has an appointment pending with Dr. Sharl Ma in January. I will fax results to his office. Follow up with patient in 3 months..    Goals for Next Visit-  1. Weight Watchers- be more diligent with tracking your food. Goal points- 26. Once you get your new phone, download the  weight watchers app. Also look for the weight watchers kitchen app.  2. Get your husband to move the treadmill into the den so that you can watch TV while you are walking. Aim for 3 days a week for 30 minutes.  3. Check your blood sugar every morning.   Next appointment to see me is Friday February 27th at 8:30 AM.

## 2013-10-27 NOTE — Progress Notes (Signed)
Patient ID: Robin Greene, female   DOB: [birth year], 58 y.o.   MRN: [id number] ATTENDING PHYSICIAN NOTE: I have reviewed the chart and agree with the plan as detailed above. Dorcas Mcmurray MD Pager [phone]

## 2013-10-27 NOTE — Progress Notes (Signed)
Patient ID: Robin Greene, female   DOB: 11/01/1955, 57 y.o.   MRN: [id number] ATTENDING PHYSICIAN NOTE: I have reviewed the chart and agree with the plan as detailed above. Tondra Reierson MD Pager [phone]  

## 2013-11-29 ENCOUNTER — Emergency Department (HOSPITAL_COMMUNITY)
Admission: EM | Admit: 2013-11-29 | Discharge: 2013-11-29 | Disposition: A | Discharge status: Home / Self Care | Payer: 59 | Source: Home / Self Care | Attending: Family Medicine | Admitting: Family Medicine

## 2013-11-29 ENCOUNTER — Encounter (HOSPITAL_COMMUNITY): Payer: Self-pay | Admitting: Emergency Medicine

## 2013-11-29 DIAGNOSIS — J069 Acute upper respiratory infection, unspecified: Secondary | ICD-10-CM

## 2013-11-29 MED ORDER — MINOCYCLINE HCL 100 MG PO CAPS: 100.0000 mg | ORAL_CAPSULE | Freq: Two times a day (BID) | ORAL | Status: DC

## 2013-11-29 MED ORDER — IPRATROPIUM BROMIDE 0.06 % NA SOLN: 2.0000 | Freq: Four times a day (QID) | NASAL | Status: DC

## 2013-11-29 NOTE — ED Provider Notes (Signed)
CSN: [id number]     Arrival date & time 11/29/13  1802 History   First MD Initiated Contact with Patient 11/29/13 1817     Chief Complaint  Patient presents with  . URI     (Consider location/radiation/quality/duration/timing/severity/associated sxs/prior Treatment) Patient is a 58 y.o. female presenting with URI. The history is provided by the patient.  URI Presenting symptoms: congestion, cough, ear pain, facial pain and rhinorrhea   Presenting symptoms: no fever   Severity:  Moderate Onset quality:  Gradual Duration:  5 days Progression:  Unchanged Chronicity:  New Risk factors: recent travel and sick contacts     Past Medical History  Diagnosis Date  . Hypertension   . Diabetes mellitus    Past Surgical History  Procedure Laterality Date  . Ovary surgery    . Abdominal hysterectomy     History reviewed. No pertinent family history. History  Substance Use Topics  . Smoking status: Never Smoker   . Smokeless tobacco: Never Used  . Alcohol Use: Yes     Comment: occational wine   OB History   Grav Para Term Preterm Abortions TAB SAB Ect Mult Living                 Review of Systems  Constitutional: Negative.  Negative for fever.  HENT: Positive for congestion, ear pain, postnasal drip and rhinorrhea.   Respiratory: Positive for cough. Negative for shortness of breath.       Allergies  Review of patient's allergies indicates no known allergies.  Home Medications   Current Outpatient Rx  Name  Route  Sig  Dispense  Refill  . ALPRAZolam (XANAX) 0.25 MG tablet   Oral   Take 0.25 mg by mouth 3 (three) times daily as needed.         . cetirizine (ZYRTEC) 10 MG tablet   Oral   Take 10 mg by mouth daily.         . cholecalciferol (VITAMIN D) 1000 UNITS tablet   Oral   Take 1,000 Units by mouth daily.         . clobetasol cream (TEMOVATE) 0.05 %   Topical   Apply 1 application topically daily.         Marland Kitchen estradiol (ESTRACE) 0.1 MG/GM vaginal  cream   Vaginal   Place 1-2 g vaginally 2 (two) times a week.         . ferrous sulfate 325 (65 FE) MG tablet   Oral   Take 325 mg by mouth every other day.         Marland Kitchen glucose blood test strip   Subcutaneous   Inject 1 each into the skin as needed. Use as instructed         . insulin glargine (LANTUS) 100 UNIT/ML injection   Subcutaneous   Inject 40 Units into the skin daily.         Marland Kitchen ipratropium (ATROVENT) 0.06 % nasal spray   Each Nare   Place 2 sprays into both nostrils 4 (four) times daily.   15 mL   1   . KRILL OIL 1000 MG CAPS   Oral   Take 1,000 mg by mouth daily.         Marland Kitchen levothyroxine (SYNTHROID, LEVOTHROID) 175 MCG tablet   Oral   Take 175 mcg by mouth daily.         . Liraglutide (VICTOZA) 18 MG/3ML SOLN injection   Subcutaneous   Inject 1.8 mg  into the skin daily.         Marland Kitchen lisinopril-hydrochlorothiazide (PRINZIDE,ZESTORETIC) 20-12.5 MG per tablet   Oral   Take 2 tablets by mouth daily.         . metFORMIN (GLUCOPHAGE) 1000 MG tablet   Oral   Take 1,000 mg by mouth 2 (two) times daily with a meal.         . metroNIDAZOLE (METROGEL) 1 % gel   Topical   Apply 1 application topically daily.         . minocycline (MINOCIN,DYNACIN) 100 MG capsule   Oral   Take 1 capsule (100 mg total) by mouth 2 (two) times daily.   20 capsule   0   . Probiotic Product (PROBIOTIC DAILY) CAPS   Oral   Take 1 tablet by mouth daily.         . simvastatin (ZOCOR) 20 MG tablet   Oral   Take 20 mg by mouth every evening.         . valACYclovir (VALTREX) 1000 MG tablet   Oral   Take 500 mg by mouth 2 (two) times daily. 1/2 tablet twice daily for 5 days as needed for flares          BP 153/82  Pulse 95  Temp(Src) 98.5 F (36.9 C) (Oral)  Resp 20  SpO2 100% Physical Exam  Nursing note and vitals reviewed. Constitutional: She is oriented to person, place, and time. She appears well-developed and well-nourished. No distress.  HENT:   Right Ear: Ear canal normal. Tympanic membrane is retracted. Tympanic membrane mobility is abnormal. A middle ear effusion is present.  Left Ear: Ear canal normal. Tympanic membrane is retracted. Tympanic membrane mobility is abnormal. A middle ear effusion is present.  Mouth/Throat: Oropharynx is clear and moist.  Neck: Normal range of motion. Neck supple.  Lymphadenopathy:    She has no cervical adenopathy.  Neurological: She is alert and oriented to person, place, and time.  Skin: Skin is warm and dry.    ED Course  Procedures (including critical care time) Labs Review Labs Reviewed - No data to display Imaging Review No results found.    MDM   Final diagnoses:  URI (upper respiratory infection)       Billy Fischer, MD 11/29/13 [phone]

## 2013-11-29 NOTE — Discharge Instructions (Signed)
Drink plenty of fluids as discussed, use medicine as prescribed, and mucinex or delsym for cough. Return or see your doctor if further problems °

## 2013-11-29 NOTE — ED Notes (Signed)
C/o has not felt well since her flight to a training seminar a few days ago. C/o yellow secretions, congestion, ear pain

## 2013-12-26 ENCOUNTER — Ambulatory Visit (INDEPENDENT_AMBULATORY_CARE_PROVIDER_SITE_OTHER): Payer: Self-pay | Admitting: Family Medicine

## 2013-12-26 VITALS — BP 141/84 | HR 86 | Ht 65.5 in | Wt 197.4 lb

## 2013-12-26 DIAGNOSIS — E119 Type 2 diabetes mellitus without complications: Secondary | ICD-10-CM

## 2013-12-26 NOTE — Progress Notes (Signed)
Subjective:  Patient presents today for 3 month diabetes follow-up as part of the employer-sponsored Link to Wellness program. Current diabetes regimen includes Lantus 50 units once daily in the morning, metformin 1000 mg BID. Patient also continues on daily ACEi, and statin.   She has stopped taking krill oil and has returned to taking fish oil. Simvastatin dose was also increased to 40 mg once daily. Patient states that her last lipid panel showed that her LDL had gone up and HDL went down. She does not have a copy of those results.   She states that since she was travelling to [place] for [employer] training her schedule has been off. She gained 7 pounds while she was traveling because she wasn't able to exercise and she was not on her normal eating schedule. She states that since she has been home she has started back on her routine of eating and exercising and has lost 5 of those pounds.   Last visit with Dr. Buddy Duty was in February. patient requested that we check A1C today.    Disease Assessments:  Diabetes:  Type of Diabetes: Type 2; Year of diagnosis 2008; Sees Diabetes provider 4 or more times per year; Diabetes Education NDC DM classes; MD managing Diabetes Robin Greene; checks feet daily; uses glucometer; takes medications as prescribed; does not take an aspirin a day; checks blood glucose once daily; What is target A1c? %; Highest CBG 156; Lowest CBG 99; hypoglycemia frequency never; Other Diabetes History: Patient states that she is checking her CBG once daily fasting in the morning. She did not bring her meter with her to this appointment. She wasn't checking her CBG when she was in [place] for training, but has restarted checking since she has been home.   Per patient report fasting CBG are running between 100-150s. Highs/Lows are patient reported.   Lantus titration stopped at 50 units once daily in the morning.  A1C today was 7.3%.        Physical Activity-  She states that she is  using the treadmill again. This past week she was on the treadmill 3 times last week for about 20 minutes. She, her husband and step grandson are having a competition to see who can lose the most weight and get on the treadmill first.   Nutrition-   She states that she is going to go back onto Weight Watchers. She tried just using the online version but states that she wasn't accountable for things and didn't do well. She is going to start going to the meetings again.       Hemoglobin A1c: 12/26/2013 via POCT 7.3  Bone Density: 05/06/2013    Dilated Eye Exam: 07/16/2013  Flexible Sigmoidoscopy: 12/08/2010  Flu vaccine: 06/16/2013  Last mammography: 05/06/2013  Other Preventive Care Notes:  Dental Visit- July 2014  Last Endocrinologist Visit 03/31/2013       Vital Signs:  12/26/2013 9:04 AM (EST) Blood Pressure 141 / 84 mm/HgBMI 32.3; Height 5 ft 5.5 in; Pulse Rate 86 bpm; Weight 197.4 lbs    Testing:  Blood Sugar Tests:  Hemoglobin A1c: 7.3 via POCT resulted on 12/26/2013    Assessment/Plan: Patient is a 58 year old female with DM2. A1C today was 7.3% and is slightly above goal of less than 7%. This has increased from her last visit with me when A1C was 6.8%. This is likely secondary to weight gain, cessation of physical activity and patient stopping weight watchers. Weight is about 5 pounds increased since  her last visit with me. Patient continues to take Lantus 50 units once daily and BID metformin.   We discussed today getting back into her previous routines of physical activity and healthy eating habits. She states that she wants to restart weight watchers and she knows how to sign up for the program. I encouraged her to do it today and start attending the meetings this week. She also has started this past week walking on the treadmill and is looking forward to walking outside as the weather improves.   I will fax A1C results to Dr. Cindra Eves office. I will follow up with patient  when I return from maternity leave..    Goals for Next Visit-  1. Start attending the [employer] meetings again. You can go tomorrow :-)  2. Physical Activity- goal of 5 days a week. Ideas- treadmill, walking outside, using the employee gym.  3. Make it to 1 class over at the gym off of [facility].   Next appointment to see me is Friday July 31st at 9 AM.

## 2014-04-08 ENCOUNTER — Other Ambulatory Visit (HOSPITAL_COMMUNITY): Payer: Self-pay | Admitting: Obstetrics and Gynecology

## 2014-04-08 DIAGNOSIS — Z1231 Encounter for screening mammogram for malignant neoplasm of breast: Secondary | ICD-10-CM

## 2014-05-07 ENCOUNTER — Ambulatory Visit (HOSPITAL_COMMUNITY)
Admission: RE | Admit: 2014-05-07 | Discharge: 2014-05-07 | Disposition: A | Discharge status: Home / Self Care | Payer: 59 | Source: Ambulatory Visit | Attending: Obstetrics and Gynecology | Admitting: Obstetrics and Gynecology

## 2014-05-07 DIAGNOSIS — Z1231 Encounter for screening mammogram for malignant neoplasm of breast: Secondary | ICD-10-CM | POA: Insufficient documentation

## 2014-05-11 ENCOUNTER — Other Ambulatory Visit: Payer: Self-pay | Admitting: Obstetrics and Gynecology

## 2014-05-11 DIAGNOSIS — R928 Other abnormal and inconclusive findings on diagnostic imaging of breast: Secondary | ICD-10-CM

## 2014-05-15 ENCOUNTER — Ambulatory Visit
Admission: RE | Admit: 2014-05-15 | Discharge: 2014-05-15 | Disposition: A | Discharge status: Home / Self Care | Payer: 59 | Source: Ambulatory Visit | Attending: Obstetrics and Gynecology | Admitting: Obstetrics and Gynecology

## 2014-05-15 DIAGNOSIS — R928 Other abnormal and inconclusive findings on diagnostic imaging of breast: Secondary | ICD-10-CM

## 2014-06-05 ENCOUNTER — Ambulatory Visit (INDEPENDENT_AMBULATORY_CARE_PROVIDER_SITE_OTHER): Payer: Self-pay | Admitting: Family Medicine

## 2014-06-05 VITALS — BP 135/79 | HR 79 | Ht 65.5 in | Wt 188.4 lb

## 2014-06-05 DIAGNOSIS — E119 Type 2 diabetes mellitus without complications: Secondary | ICD-10-CM

## 2014-06-05 NOTE — Progress Notes (Signed)
Subjective:  Patient presents today for 3 month diabetes follow-up as part of the employer-sponsored Link to Wellness program. Current diabetes regimen includes Lanuts 36 units SQ once daily, metformin 1000 mg BID, Victoza 1.8 mg SQ once daily. Patient also continues on daily ACE-i. No major health changes at this time. Simvastatin dose was increased to 40 mg daily earlier this year. She experienced severe muscle pain when the dosage was increased. So she stopped taking simvastatin. Livalo was prescribed and she has picked it up but has never taken it. She wanted to wait to take the Livalo until all of her muscle and joint pain symptoms were resolved. She is willing to start taking it now. She has lost 9 pounds since her last appointment. She started Weight Watchers in July and is attending the meetings and tracking her food on paper. Last appointment with Buddy Duty was 3 weeks ago and A1C was 6.9%. Lantus dose is down to 36 units once daily in the morning. Also started Contrave. She thinks this has really helped her to lose weight.   Disease Assessments:  Diabetes: Type of Diabetes: Type 2; Year of diagnosis 2008; Sees Diabetes provider 4 or more times per year; Diabetes Education [employer]DC DM classes; MD managing Diabetes Delrae Rend; checks feet daily; uses glucometer; takes medications as prescribed; does not take an aspirin a day; checks blood glucose once daily; What is target A1c? %; hypoglycemia frequency never;   Highest CBG 171; Lowest CBG 86; 7 day CBG average 113; 14 day CBG average 119; 30 day CBG average 112; Other Diabetes History:  She is checking her blood sugar once daily in the morning. She sometimes will check it again in the evening. She has the One Touch Mini Meter. CBGs are averaging 100-130s in the morning. For the past several days she has been less than 100. If this continues, advised patient to drop Lantus by 2-4 units in the morning. Since she started weight loss she has dropped Lantus  dose from 50 units once daily down to 36 units once daily. Patient denies hypoglycemia.          Tobacco Assessment: Smoking Status: Never smoker; Last Reviewed: 12/26/2013   Social History:  Exercise habits: 90 minutes; Caffeine use: tea; No drug use; Medication adherence adherent; Patient can afford medications; Patient knows the purpose/use of medications; Pharmacy assist consult was not needed; Pharmacist consult was not done;   Exercise adherence 3-4 days a week; Alcohol use: 1 - 2 drinks per week; Wine; 90 minutes of exercise per week; Diet adherence Greater than 75% of the time.    Physical Activity- She is using the treadmill three times a week for 30 minutes each time. Nothing in addition. She is trying to get 10,000 steps in each day.  Nutrition-  She is back on Weight Watchers and tracking her food daily. She has 26 daily points in addition to 49 weekly points. She states that she has been doing very well with it and has lost 9 pounds. She is sticking to her points every day.     Vital Signs:  06/05/2014 11:29 AM (EST)Blood Pressure 135 / 79 mm/HgBMI 30.9; Height 5 ft 5.5 in; Pulse Rate 79 bpm; Weight 188.4 lbs   Testing:  Blood Sugar Tests: Hemoglobin A1c: 6.9 via dr. Buddy Duty  resulted on 05/15/2014    Assessment/Plan:  Patient is a 58year old female with DM2. Most recent A1C was 6.9% and is meeting goal of less than 7%. She has made  several changes to her lifestyle lately. She restarted weight watchers and has decreased her overall portion sizes. She is also eating more vegetables. Since her last visit with me in March she has lost 9 pounds. She continues to exercise and is getting 90 minutes of physical activity each week. I encouraged her to increase her treadmill use to 4 times a week for 120 minutes each week. I congratulated patient on weight loss and encouraged her to continue making healthy eating choices. Follow up with patient in 3 months.      Goals for Next  Visit- 1. Start taking Livalo. 2. Increase physical activity to four days a week. Try adding Thursday or Saturday as your extra day on the treadmill. 3. Continue following the weight watchers plan and attending meetings. Keep planning ahead for parties/special events and snacking. Next appointment to see me is Friday December 4th at 8:30 AM.

## 2014-06-15 ENCOUNTER — Encounter: Payer: Self-pay | Admitting: Family Medicine

## 2014-06-15 NOTE — Progress Notes (Signed)
Patient ID: Robin Greene, female   DOB: 10/10/1956, 58 y.o.   MRN: [id number] Reviewed: Agree with the management and documentation of our [facility] pharmacologist.

## 2014-08-21 ENCOUNTER — Encounter: Payer: Self-pay | Admitting: Family Medicine

## 2014-08-21 NOTE — Progress Notes (Signed)
Patient ID: Robin Greene, female   DOB: 11/21/1955, 58 y.o.   MRN: [id number] Reviewed: Agree with the documentation and management of our [place] Pharmacologist. 

## 2014-09-25 ENCOUNTER — Ambulatory Visit (INDEPENDENT_AMBULATORY_CARE_PROVIDER_SITE_OTHER): Payer: Self-pay | Admitting: Family Medicine

## 2014-09-25 VITALS — BP 108/70 | Wt 174.4 lb

## 2014-09-25 DIAGNOSIS — E119 Type 2 diabetes mellitus without complications: Secondary | ICD-10-CM

## 2014-09-25 NOTE — Assessment & Plan Note (Signed)
Subjective:  Patient presents today for 3 month diabetes follow-up as part of the employer-sponsored Link to Wellness program. Current diabetes regimen includes Lantus 10 units once daily, metformin 1000 mg BID and Victoza 1.8 mg SQ daily. Patient also continues on daily ACEi, and statin.  She reports that things have been going well since she saw me last. She has lost 16 pounds since her last visit with me. Lantus dose has decreased from 36 units to 10 units once daily. She is now on the Contrave three times daily, but she thinks that she will come off of it next month. She thinks that she is eating less than she was before.  Last visit with Dr. Buddy Duty was in August. She thinks that her A1C was around 7. She has an appointment pending with him next month.   Disease Assessments:  Diabetes: Type of Diabetes: Type 2; Year of diagnosis 2008; Sees Diabetes provider 4 or more times per year; Diabetes Education [employer] DM classes; MD managing Diabetes Delrae Rend; checks feet daily; uses glucometer; takes medications as prescribed; does not take an aspirin a day; checks blood glucose once daily; What is target A1c? %; hypoglycemia frequency never;  Highest CBG 132; Lowest CBG 89;   Other Diabetes History:  She is checking blood sugar once daily fasting. Sometimes she is also checking fasting. Patient forgot to bring meter in today. highs/lows are patient reported.  She has been decreasing Lantus dose by 2 units every several days as she has lost weight.  She previously was using 36 units once daily at last visit and today she is down to 10 units of Lantus. In the last week CBG has been 89-112.  She denies hypoglycemia, but states that when CBG is in the 80s she gets grumpy and feels like she needs to eat.  A1C today was 6.1%.          Tobacco Assessment: Smoking Status: Never smoker; Last Reviewed: 09/25/2014   Social History:  Exercise habits: 90 minutes; Caffeine use: tea; No drug use; Medication  adherence adherent; Patient can afford medications; Patient knows the purpose/use of medications; Pharmacy assist consult was not needed; Pharmacist consult was not done; Exercise adherence 3-4 days a week; Alcohol use: 1 - 2 drinks per week; Wine; Diet adherence Greater than 75% of the time; 120 minutes of exercise per week.  Occupation: Lawyer- [employer]  Physical Activity- She is using the treadmill for a mile and a half 5 days a week. This takes about 20-25 minutes. She is also using the ab cruncher daily.  Nutrition-  She still uses weight watchers and is tracking food daily.                             Overall Health Assessments:  Vision:  Dilated Eye Exam: 10/16/2014   Vital Signs:  09/25/2014 11:35 AM (EST)Blood Pressure 108 / 70 mm/HgBMI 28.6; Height 5 ft 5.5 in; Weight 174.4 lbs   Testing:  Blood Sugar Tests: Hemoglobin A1c: 6.1 Via POCT resulted on 09/25/2014   Care Planning:  Learning Preference Assessment:  Learner: Patient  Readiness to Learn Barriers: None  Teaching Method: Explanation  Evaluation of Learning: Can function independently and verbalize knowledge  Readiness to Change:  How important is your health to you? 10  How confident are you in working to improve your health? 10  How ready are you to change to improve your health? 10  Total Score: 10  Care Plan:  09/25/2014 11:35 AM (EST) (1)  Problem: BMI >25  Role: Clinical Pharmacist  Long Term Goal Continue following weight watchers plan and physical activity 5 days a week.  Date Started: 09/25/2014   Teaching Goals:  Assessment/Plan: Patient is a 58year old female with DM2. A1C today was 6.1% and is meeting goal of less than 7%. This is also an improvement over her last A1C of 6.9%. Patient denies hypoglycemia. She has been slowly decreasing Lantus dose over the past several months as she continues to lose weight. I recommended to patient that she continue to decrease Lantus dose and  that eventually she may not need the Lantus at all.  Patient continues to make healthy eating choices. She is following Weight Watchers and has lost 14 pounds since her last appointment with me. She has increased physical activity and is now walking on the treadmill 5 days a week. I congratulated patient on her success and encouraged her to continue. I will fax A1C results to Dr. Cindra Eves office. Follow up with patient in 3 months.  Goals for Next Visit- 1. Continue working to decrease Lantus dose the way you have been - 2 units every 5-7 days. Continue checking blood sugars daily in the morning. 2. Weight loss- continue physical activity. Continue following your points for Weight Watchers. Next Appointment to see me is Friday March 18th at 8:30 AM.

## 2014-10-29 NOTE — Progress Notes (Signed)
Patient ID: Robin Greene, female   DOB: December 18, 1955, 59 y.o.   MRN: [id number] Reviewed: Agree with the documentation and management of our [place].

## 2014-12-22 ENCOUNTER — Other Ambulatory Visit: Payer: Self-pay | Admitting: Obstetrics and Gynecology

## 2014-12-22 DIAGNOSIS — R921 Mammographic calcification found on diagnostic imaging of breast: Secondary | ICD-10-CM

## 2014-12-30 ENCOUNTER — Ambulatory Visit
Admission: RE | Admit: 2014-12-30 | Discharge: 2014-12-30 | Disposition: A | Discharge status: Home / Self Care | Payer: 59 | Source: Ambulatory Visit | Attending: Obstetrics and Gynecology | Admitting: Obstetrics and Gynecology

## 2014-12-30 DIAGNOSIS — R921 Mammographic calcification found on diagnostic imaging of breast: Secondary | ICD-10-CM

## 2015-01-15 ENCOUNTER — Ambulatory Visit (INDEPENDENT_AMBULATORY_CARE_PROVIDER_SITE_OTHER): Payer: Self-pay | Admitting: Family Medicine

## 2015-01-15 ENCOUNTER — Ambulatory Visit: Payer: 59 | Admitting: Pharmacist

## 2015-01-15 ENCOUNTER — Encounter: Payer: Self-pay | Admitting: Pharmacist

## 2015-01-15 VITALS — BP 126/70 | Ht 65.0 in | Wt 177.2 lb

## 2015-01-15 DIAGNOSIS — E119 Type 2 diabetes mellitus without complications: Secondary | ICD-10-CM

## 2015-01-15 NOTE — Progress Notes (Signed)
Subjective:  Patient presents today for 3 month diabetes follow-up as part of the employer-sponsored Link to Wellness program.  Current diabetes regimen includes Victoza 1.8 mg SQ once daily & metformin 1000 mg BID.   Patient also continues on daily ACE Inhibitor and statin.  Most recent MD follow-up was in February.  Patient has a pending appt for July with Dr. Buddy Duty.  No major health changes at this time.   Last visit with Dr. Buddy Duty was in February. A1C at that time was 6.7%. She stopped taking Lantus in January because of hypoglycemia. She had decreased her dose down to 10 units daily and was having lows in the 60-70s. Since d/c Lantus she is no longer having hpypoglycemia.   Medication changes- Lantus was d/c a few months ago;  synthroid dose dropped from 175 mcg to 150 mcg and then this week to 137 mcg;  lisinopril hctz was decreased to 20/12.5 mg once daily; contrave ER was discontinued in January because patient was having issues with hand shaking (difficult to discern if this was because of medication or if Synthroid dose was too high);  patient no longer taking Estrace cream pending ongoing Josph Macho vaginal procedure.   Assessment/Plan:  Patient is a 59  yo female with DM 2. Most recent A1C was   6.7% which is at goal/exceeding goal of less than 7%. Weight is increased by 3 pounds from last visit with me.   Lifestyle improvements:  Physical Activity-  She is walking a few times a week for 30 minutes. She states that she could be doing more.   Nutrition- Since stopping Contrave patient states that she is snacking more often. Sometimes she can eat another meal after dinner. Sometimes it is celery and carrots, other times it is weight watchers chips. She is still attending weight watchers meetings. She has been struggling with the new Weight Watchers smart points system.    Follow up with me in 3 months.    Bracey Plan        Office Visit from 01/15/2015 in [place] Problem One  BMI>25   Care Plan for Problem One  Active   Interventions for Problem One Long Term Goal  Encouraged patient to continue with Weight Watchers   [facility] Long Term Goal (31-90 days)  More strictly adhere to weight watchers plan (attending meetings, tracking points every day)   [facility] Long Term Goal Start Date  01/15/15   Care Plan Problem Two  Physical Activity   Care Plan for Problem Two  Active   [facility]N Long Term Goal (31-90) days  Increase physical activity to four days a week for 30 minutes.        Goals for Next Visit:  1. More strictly adhere to weight watchers plan (attending meetings, tracking points every day).  2. Increase physical activity to four days a week for 30 minutes.  3. Start taking Synthroid 137 mcg daily.     Next appointment to see me is: Friday July 29th at [place] D. Donneta Romberg, PharmD, BCPS, CDE Norm Parcel to [place] Coordinator [phone]

## 2015-01-15 NOTE — Patient Instructions (Signed)
Goals for Next Visit:  1. More strictly adhere to weight watchers plan (attending meetings, tracking points every day).  2. Increase physical activity to four days a week for 30 minutes.  3. Start taking Synthroid 137 mcg daily.     Next appointment to see me is: Friday July 29th at 11 AM.

## 2015-01-26 NOTE — Progress Notes (Signed)
Patient ID: Robin Greene, female   DOB: [birth year], 60 y.o.   MRN: [id number] ATTENDING PHYSICIAN NOTE: I have reviewed the chart and agree with the plan as detailed above. Dorcas Mcmurray MD Pager [phone]

## 2015-03-12 ENCOUNTER — Encounter: Payer: Self-pay | Admitting: Pharmacist

## 2015-05-14 ENCOUNTER — Ambulatory Visit: Payer: Self-pay | Admitting: Pharmacist

## 2015-05-14 ENCOUNTER — Ambulatory Visit (INDEPENDENT_AMBULATORY_CARE_PROVIDER_SITE_OTHER): Payer: Self-pay | Admitting: Family Medicine

## 2015-05-14 VITALS — BP 118/68 | Ht 65.0 in | Wt 182.2 lb

## 2015-05-14 DIAGNOSIS — E119 Type 2 diabetes mellitus without complications: Secondary | ICD-10-CM

## 2015-05-14 NOTE — Progress Notes (Signed)
Subjective:  Patient presents today for 3 month diabetes follow-up as part of the employer-sponsored Link to Wellness program.  Current diabetes regimen includes Victoza 1.8 mg SQ daily, metformin 1000 mg BID.  Patient also continues on daily ACE Inhibitor and statin.    Since her last appointment with me she was started on Cytomel 5 mg BID and Synthroid was decreased to 100 mg daily. She states that she thinks the dose is not where it needs to be- she is not shaking anymore and her hair is no longer falling out, but patient reports that she has a lot of energy during the day and is having trouble sleeping at night.   She has an pending appointment with Dr. Buddy Duty next week so I will defer A1C testing to that appointment. A1C in April was 6.7%.     Assessment/Plan:  Patient is a 59 y.o. female with DM 2. Most recent A1C was   6.7% which is at goal of less than 7%. However, based on patient's CBG averages from her meter I expect that A1C will be elevated above goal when it is checked next week with Dr. Buddy Duty.   Weight is increased from last visit with me by 4 pounds.   CBG Review: She is checking a few times a week, fasting. CBG averages are 177 for the past 30 days. Low/High- 119/201. Denies hyopglycemia.   Lifestyle improvements:  Physical Activity-  Patient has increased physical activity since her last visit with me. She is now walking 30 minutes during her work day (in 10 minute increments) five days a week. She is sometimes walking at home on the treadmill. Her husband also has diabetes and she wants to start walking with him on the weekends.    Nutrition-  Patient reports that eating habits have not changed. She is grilling more with the hotter weather; eating fresh vegetables.   She is still tracking food and is following her points for weight watchers. She states that she hasn't been as good about attending meetings but that she is still following her points for the most part.    Follow up with me in 3 months.    Goals for Next Visit:  1. Weight Loss- Increase physical activity to add weekends- walk for 20 minutes Saturday and Sunday. Continue walking during the workweek as well.  2. Continue following weight watchers and start attending meetings.  3. Log into the [employer] Website and start tracking your physical activity.     Next appointment to see me is: Friday October 28th at 8:30 AM.   Truett Mainland. Donneta Romberg, PharmD, BCPS, CDE Norm Parcel to [place] Coordinator [phone]

## 2015-05-24 ENCOUNTER — Other Ambulatory Visit: Payer: Self-pay | Admitting: Obstetrics and Gynecology

## 2015-05-24 DIAGNOSIS — R921 Mammographic calcification found on diagnostic imaging of breast: Secondary | ICD-10-CM

## 2015-05-31 NOTE — Progress Notes (Signed)
Patient ID: Robin Greene, female   DOB: 03-26-1956, 59 y.o.   MRN: [id number] ATTENDING PHYSICIAN NOTE: I have reviewed the chart and agree with the plan as detailed above. Dorcas Mcmurray MD Pager [phone]

## 2015-06-08 ENCOUNTER — Ambulatory Visit
Admission: RE | Admit: 2015-06-08 | Discharge: 2015-06-08 | Disposition: A | Discharge status: Home / Self Care | Payer: 59 | Source: Ambulatory Visit | Attending: Obstetrics and Gynecology | Admitting: Obstetrics and Gynecology

## 2015-06-08 DIAGNOSIS — R921 Mammographic calcification found on diagnostic imaging of breast: Secondary | ICD-10-CM

## 2015-08-13 ENCOUNTER — Ambulatory Visit (INDEPENDENT_AMBULATORY_CARE_PROVIDER_SITE_OTHER): Payer: Self-pay | Admitting: Family Medicine

## 2015-08-13 ENCOUNTER — Encounter: Payer: Self-pay | Admitting: Pharmacist

## 2015-08-13 VITALS — BP 144/82 | Ht 65.5 in | Wt 187.4 lb

## 2015-08-13 DIAGNOSIS — E119 Type 2 diabetes mellitus without complications: Secondary | ICD-10-CM

## 2015-08-13 NOTE — Progress Notes (Signed)
Subjective:  Patient presents today for 3 month diabetes follow-up as part of the employer-sponsored Link to Wellness program.  Current diabetes regimen includes Victoza 1.8 mg Sq daily, metformin 1000 mg BID. Patient also continues on daily ACE-I & statin.  No major health changes at this time.   Patient states that she has restarted the Contrave. She initially had issues with shaking, but then she dropped the dose and titrated up to target dose and she has been OK.   Last appointment with Dr. Buddy Duty was in July. A1C at that visit was 7.6%. At that visit Dr. Buddy Duty requested that she restart Contrave in order to lose weight. She has an appointment pending to see Buddy Duty in 3 weeks.   Assessment:  Diabetes: Most recent A1C was 7.6  % which is exceeding goal of less than 7%. Weight is increased from last visit with me.    CBG Review: Patient states she has been angry with her diabetes and hasn't been checking her blood sugar at all. She did not bring her meter with her.    Lifestyle improvements:  Physical Activity-  She is walking three times a day during the work day. She is getting closer to 10,000 steps.   She has a treadmill but hasn't been using it.    Nutrition-  No major changes. She is still paying for Weight Watchers but has not been tracking food or going to meetings.   Follow up with me in 3 months.    Plan/Goals for Next Visit:  1. Start checking your blood sugar again, once daily in the morning. Can check again after a meal.  2. Start going to the [employer] meeting again.  3. Start tracking your food intake in the weight watchers app.  4. Continue walking during the day.     Next appointment to see me is:    Jinny Blossom D. Donneta Romberg, PharmD, BCPS, CDE Norm Parcel to [place] Coordinator [phone]

## 2015-08-24 NOTE — Progress Notes (Signed)
I have reviewed this pharmacist's note and agree  

## 2015-10-19 MED FILL — SYNTHROID 100 MCG TABLET: 100 | 90 days supply | Qty: 90 | Fill #3

## 2015-10-19 MED FILL — LIOTHYRONINE SOD 5 MCG TAB: 5 | 90 days supply | Qty: 180 | Fill #0

## 2015-11-12 ENCOUNTER — Ambulatory Visit: Payer: 59 | Admitting: Pharmacist

## 2015-11-16 DIAGNOSIS — Z7984 Long term (current) use of oral hypoglycemic drugs: Secondary | ICD-10-CM | POA: Diagnosis not present

## 2015-11-16 DIAGNOSIS — E1165 Type 2 diabetes mellitus with hyperglycemia: Secondary | ICD-10-CM | POA: Diagnosis not present

## 2015-12-13 ENCOUNTER — Encounter: Payer: Self-pay | Admitting: Pharmacist

## 2015-12-13 ENCOUNTER — Ambulatory Visit (INDEPENDENT_AMBULATORY_CARE_PROVIDER_SITE_OTHER): Payer: Self-pay | Admitting: Family Medicine

## 2015-12-13 VITALS — BP 124/74 | Ht 65.5 in | Wt 185.0 lb

## 2015-12-13 DIAGNOSIS — E119 Type 2 diabetes mellitus without complications: Secondary | ICD-10-CM

## 2015-12-13 MED FILL — LIVALO 1 MG TABLET: 1 | 30 days supply | Qty: 30 | Fill #2

## 2015-12-13 NOTE — Progress Notes (Signed)
Subjective:  Patient presents today for 3 month diabetes follow-up as part of the employer-sponsored Link to Wellness program.  Current diabetes regimen includes...  Patient also continues on daily ASA, ACE Inhibitor and statin.  Most recent MD follow-up was in October with Dr. Buddy Duty. She had lab work done with Dr. Cindra Eves office in January and A1C was 9.1%. However she is not scheduled to see Dr. Buddy Duty until May, so no changes were made to diabetes regimen after the A1C was elevated.  No med changes or major health changes at this time.   Patient has been out of Contrave for 6 weeks because she is awaiting a prior authorization with her insurance company.   Patient admits that she has been under a considerable amount of stress lately. She thinks this may be why CBGs are elevated.   Assessment:  Diabetes: Most recent A1C was 9.1 % which is exceeding goal of less than 7%. Weight is stable from last visit with me.    CBG Review: Patient is checking 3-4 times a week, fasting in the morning.   Majority of fasting readings are in the mid 200s. CBG averages are significantly higher than at last visit. She does not have any reading in the past month less than 200 mg/dL.   Patient denies hypoglycemia.   Given the fact that patient has an elevated A1C and cannot get in to Dr. Feliciana Rossetti for 3 more months I will contact his office to see if patient can restart Lantus 10 units SQ once daily. Patient states she still has some in date pen insulin in her fridge from when she was taking Lantus last year.    Lifestyle improvements:  Physical Activity-  She is walking 20 minutes during that days that she works (2-10 minute walk breaks). In addition she is using the elliptical machine a few days a week for 10-15 minutes. She wants to increase exercise along with her husband so they can lose weight together.   Nutrition-  She states that she was not doing great with her diet while she was sick last month, but  lately she is doing better. She is avoiding concentrated sweets (an improvement for her). She thinks that she could be doing better with eating more vegetables and reducing portion sizes.   She is not doing weight watchers anymore. She states that she is thinking about restarting but is not sure.   Follow up with me in 3 months.    Plan/Goals for Next Visit:  1. Call Dr. Cindra Eves office and check on the prior authorization. See if you can get an earlier appointment than May.  2. Increase physical activity to 20 minutes 3 days a week. Get your husband to exercise with you so that you both have someone to be accountable to.  3. Increase vegetable intake and salad intake to 3-4 vegetable servings daily.   Next appointment to see me is: Friday April 28th at [place] D. Donneta Romberg, PharmD, BCPS, CDE Norm Parcel to [place] Coordinator [phone]

## 2015-12-26 NOTE — Progress Notes (Signed)
I have reviewed this pharmacist's note and agree  

## 2015-12-28 DIAGNOSIS — Z6830 Body mass index (BMI) 30.0-30.9, adult: Secondary | ICD-10-CM | POA: Diagnosis not present

## 2015-12-28 DIAGNOSIS — Z794 Long term (current) use of insulin: Secondary | ICD-10-CM | POA: Diagnosis not present

## 2015-12-28 DIAGNOSIS — E1165 Type 2 diabetes mellitus with hyperglycemia: Secondary | ICD-10-CM | POA: Diagnosis not present

## 2015-12-28 DIAGNOSIS — E039 Hypothyroidism, unspecified: Secondary | ICD-10-CM | POA: Diagnosis not present

## 2015-12-28 DIAGNOSIS — E669 Obesity, unspecified: Secondary | ICD-10-CM | POA: Diagnosis not present

## 2015-12-28 MED FILL — JARDIANCE 10 MG TABLET: 10 | 30 days supply | Qty: 30 | Fill #0

## 2015-12-28 MED FILL — LISINOPRIL 40 MG TABLET: 40 | 30 days supply | Qty: 30 | Fill #0

## 2016-01-05 MED FILL — metroNIDAZOLE 1 % GEL: 1 | 30 days supply | Qty: 60 | Fill #0

## 2016-01-17 MED FILL — LIOTHYRONINE SOD 5 MCG TAB: 5 | 90 days supply | Qty: 180 | Fill #0

## 2016-01-19 MED FILL — metFORMIN HCL 1000 MG TABS: 1000 | 90 days supply | Qty: 180 | Fill #0

## 2016-01-20 MED FILL — JARDIANCE 10 MG TABLET: 10 | 30 days supply | Qty: 30 | Fill #0

## 2016-01-20 MED FILL — LIVALO 1 MG TABLET: 1 | 30 days supply | Qty: 30 | Fill #0

## 2016-02-01 MED FILL — SYNTHROID 100 MCG TABLET: 100 | 90 days supply | Qty: 90 | Fill #0

## 2016-02-01 MED FILL — CLOBETASOL 0.05% CREAM: 0.05 | 30 days supply | Qty: 60 | Fill #1

## 2016-02-01 MED FILL — LISINOPRIL 40 MG TABLET: 40 | 90 days supply | Qty: 90 | Fill #1

## 2016-02-08 MED FILL — FLUCONAZOLE 150 MG TABLET: 150 | 1 days supply | Qty: 1 | Fill #0

## 2016-02-08 MED FILL — JARDIANCE 25 MG TABLET: 25 | 90 days supply | Qty: 90 | Fill #0

## 2016-02-08 MED FILL — CLOTRIMAZOLE-BETAMETHASONE: 1-0.05 | 20 days supply | Qty: 45 | Fill #0

## 2016-02-09 MED FILL — LIVALO 1 MG TABLET: 1 | 90 days supply | Qty: 90 | Fill #0

## 2016-02-11 ENCOUNTER — Other Ambulatory Visit: Payer: Self-pay | Admitting: Pharmacist

## 2016-02-11 VITALS — BP 148/80 | Ht 65.0 in | Wt 189.6 lb

## 2016-02-11 DIAGNOSIS — E1165 Type 2 diabetes mellitus with hyperglycemia: Secondary | ICD-10-CM

## 2016-02-11 DIAGNOSIS — Z794 Long term (current) use of insulin: Secondary | ICD-10-CM

## 2016-02-11 NOTE — Patient Outreach (Signed)
Subjective:  Patient presents today for 3 month diabetes follow-up as part of the employer-sponsored Link to Wellness program.  Current diabetes regimen includes Lantus 20 units SQ once daily, Jardiance 25 mg daily, metformin 1000 mg BID.  Patient also continues on daily ACE Inhibitor and statin.  Most recent MD follow-up was with Dr. Buddy Duty in January. A1C at that visit was 9.1% Patient has a pending appt for next month to see Dr. Buddy Duty. No major health changes at this time.   Medication changes- Dr. Buddy Duty d/c Victoza at last visit and started Jardiance 10 mg. This has been increased to 25 mg daily. Because of the change, lisinopril/HCTZ was changed to plain lisinopril 40 mg daily. She has also restarted Lantus 20 units SQ every morning.   Patient states that stopped HCTZ 2 weeks ago and this week she has been experiencing swelling in her feet and ankles. She has slight edema in her feet and ankles, minor pitting. Denies SOB. Patient states that she is going to call Dr. Cindra Eves office today to talk to him about the swelling.   Of note patient tried Invokana about 1-2 years ago and was unable to continue taking it because of recurrent yeast infections. She states that she has a yeast infection now and is taking fluconazole to treat it.   Clobetasol cream changed to clotrimazole/betamethasone cream BID.   Assessment:  Diabetes: Most recent A1C was 9.1  % which is exceeding goal of less than 7%. Weight is stable from last visit with me.    CBG Review: Patient states she is checking once daily fasting in the morning. CBG averages have improved since last visit by about 70 points compared to last visit. Fasting averages are still above where we would like them, but much improved. Patient is also checking more frequently- checking almost every day (previously she was checking a couple of times a week).   Denies hypoglycemia.   Lifestyle improvements:  Physical Activity-  She hasn't been using the  elliptical due to discomfort sitting on a bike seat. She is walking though. She is walking 3- 10 minute stretches at work 5 days a week. Encouraged patient to continue walking and to try and increase physical activity by using the elliptical machine. Also suggested to patient to start using hand weights while walking to try and improve muscle tone.    Nutrition-  Patient states that she has cut back on eating desserts. She also reports that she has cut back on carbohydrate intake. She has been trying to follow the weight watchers diet but isn't currently attending the meetings or strictly following the diet. Encouraged patient to restart going to the meetings (she is already paying for it) and to follow the eating plan.    Follow up with me in 3 months. I will send a fax to Dr. Cindra Eves office to let him know of the swelling.    Plan/Goals for Next Visit:  1. Call Dr. Cindra Eves office today about the swelling and see what he wants you to do in regards to your BP medication.  2. Continue walking the days that you work. Increased physical activity to 20 minutes on the elliptical machine twice a week. 3. Start attending weight watchers meetings again.    Next appointment to see me is: Friday August 11th at 3:30 PM.    Jinny Blossom D. Donneta Romberg, PharmD, BCPS, CDE Norm Parcel to [place] Coordinator [phone]

## 2016-02-14 MED FILL — HYDROCHLOROTHIAZIDE 25 MG T: 25 | 30 days supply | Qty: 30 | Fill #0

## 2016-02-22 DIAGNOSIS — E669 Obesity, unspecified: Secondary | ICD-10-CM | POA: Diagnosis not present

## 2016-02-22 DIAGNOSIS — Z6831 Body mass index (BMI) 31.0-31.9, adult: Secondary | ICD-10-CM | POA: Diagnosis not present

## 2016-02-22 DIAGNOSIS — E039 Hypothyroidism, unspecified: Secondary | ICD-10-CM | POA: Diagnosis not present

## 2016-02-22 DIAGNOSIS — Z794 Long term (current) use of insulin: Secondary | ICD-10-CM | POA: Diagnosis not present

## 2016-02-22 DIAGNOSIS — E1165 Type 2 diabetes mellitus with hyperglycemia: Secondary | ICD-10-CM | POA: Diagnosis not present

## 2016-02-24 MED FILL — LANTUS SOLOSTAR 100 UNITS/M: 100 | 90 days supply | Qty: 18 | Fill #0

## 2016-03-21 MED FILL — HYDROCHLOROTHIAZIDE 25 MG T: 25 | 30 days supply | Qty: 30 | Fill #1

## 2016-03-30 MED FILL — UNIFINE PENTIPS 32GX5/32: 32G X 4 MM | 30 days supply | Qty: 200 | Fill #0

## 2016-04-14 MED FILL — metFORMIN HCL 1000 MG TABS: 1000 | 90 days supply | Qty: 180 | Fill #1

## 2016-04-14 MED FILL — LIOTHYRONINE SOD 5 MCG TAB: 5 | 90 days supply | Qty: 180 | Fill #1

## 2016-04-25 MED FILL — SYNTHROID 100 MCG TABLET: 100 | 90 days supply | Qty: 90 | Fill #1

## 2016-04-25 MED FILL — HYDROCHLOROTHIAZIDE 25 MG T: 25 | 30 days supply | Qty: 30 | Fill #0

## 2016-04-28 MED FILL — valACYclovir HCL 1 GM TABS: 1 | 30 days supply | Qty: 30 | Fill #1

## 2016-05-04 MED FILL — LANTUS SOLOSTAR 100 UNITS/M: 100 | 90 days supply | Qty: 30 | Fill #0

## 2016-05-08 MED FILL — LISINOPRIL 40 MG TABLET: 40 | 90 days supply | Qty: 90 | Fill #2

## 2016-05-08 MED FILL — JARDIANCE 25 MG TABLET: 25 | 90 days supply | Qty: 90 | Fill #1

## 2016-05-17 MED FILL — CLOTRIMAZOLE-BETAMETHASONE: 1-0.05 | 20 days supply | Qty: 45 | Fill #0

## 2016-05-26 ENCOUNTER — Ambulatory Visit: Payer: Self-pay | Admitting: Pharmacist

## 2016-06-02 ENCOUNTER — Ambulatory Visit: Payer: Self-pay | Admitting: Pharmacist

## 2016-06-02 MED FILL — HYDROCHLOROTHIAZIDE 25 MG T: 25 | 30 days supply | Qty: 30 | Fill #1

## 2016-06-09 ENCOUNTER — Encounter: Payer: Self-pay | Admitting: Pharmacist

## 2016-06-09 ENCOUNTER — Other Ambulatory Visit: Payer: Self-pay | Admitting: Pharmacist

## 2016-06-09 VITALS — BP 138/72 | Wt 188.6 lb

## 2016-06-09 DIAGNOSIS — E1165 Type 2 diabetes mellitus with hyperglycemia: Secondary | ICD-10-CM

## 2016-06-09 DIAGNOSIS — Z85820 Personal history of malignant melanoma of skin: Secondary | ICD-10-CM | POA: Diagnosis not present

## 2016-06-09 DIAGNOSIS — L814 Other melanin hyperpigmentation: Secondary | ICD-10-CM | POA: Diagnosis not present

## 2016-06-09 DIAGNOSIS — Z794 Long term (current) use of insulin: Secondary | ICD-10-CM

## 2016-06-09 DIAGNOSIS — D235 Other benign neoplasm of skin of trunk: Secondary | ICD-10-CM | POA: Diagnosis not present

## 2016-06-09 MED FILL — CLOTRIMAZOLE-BETAMETHASONE: 1-0.05 | 15 days supply | Qty: 45 | Fill #0

## 2016-06-09 NOTE — Patient Outreach (Signed)
Subjective:  Patient presents today for 3 month diabetes follow-up as part of the employer-sponsored Link to Wellness program.  Current diabetes regimen includes metformin 1000 mg BID, Jardiance 25 units daily, Lantus 20 units SQ once daily. Patient also continues on daily ASA, ACE Inhibitor and statin.  Most recent MD follow-up was with Dr. Buddy Duty in May. Patient was supposed to see Dr. Buddy Duty this week but due to a death in the family patient had to reschedule. She has been rescheduled to October. Patient declined A1C test today. No med changes or major health changes at this time.   Patient's mother in law died a week ago today and she states that things have been very stressful lately.   Assessment:  Diabetes: Most recent A1C was 9.1  % which is exceeding goal of less than 7%. Weight is stable from last visit with me.    CBG Review: Patient did not bring meter with her to the appointment. She states that she is checking once daily in the morning and then sometimes again in the evening, 2 hours after dinner.   Patient recently changed administration times for Jardiance tablet- now taking at lunch and she reports that AM fasting CBGS have improved. They were running in the 190s and since the change she is is running in the 130-160s.   Denies low blood sugars.   Lifestyle improvements:  Physical Activity-  She is doing the elliptical three times a week for 20 minutes. She is trying to make sure she is exercising with her spouse.    Nutrition-  She states that she has been trying to eat proteins and salads. She reports no major eating changes. She was following weight watchers until the craziness of the last few weeks. She was attending meetings (one of her goals from last visit). She did think these meetings were helpful and would like to start attending again.    Follow up with me in 3 months.    Plan/Goals for Next Visit:  1. Increase physical activity. Aim for 25 minutes on the  elliptical machine three days a week. Try to incorporate some strength training exercises with hand weights and also with body weight training (squats).  2. Start attending weight watchers meetings again and start following the meal plan.     Next appointment to see me is: Friday February 2nd at 8:30 AM.    Truett Mainland. Donneta Romberg, PharmD, BCPS, CDE Norm Parcel to [place] Coordinator [phone]

## 2016-06-20 DIAGNOSIS — B373 Candidiasis of vulva and vagina: Secondary | ICD-10-CM | POA: Diagnosis not present

## 2016-06-20 DIAGNOSIS — Z6831 Body mass index (BMI) 31.0-31.9, adult: Secondary | ICD-10-CM | POA: Diagnosis not present

## 2016-06-20 DIAGNOSIS — Z01419 Encounter for gynecological examination (general) (routine) without abnormal findings: Secondary | ICD-10-CM | POA: Diagnosis not present

## 2016-06-20 MED FILL — TERCONAZOLE 0.4% VAG CREAM: 0.4 | 7 days supply | Qty: 45 | Fill #0

## 2016-07-05 MED FILL — HYDROCHLOROTHIAZIDE 25 MG T: 25 | 30 days supply | Qty: 30 | Fill #2

## 2016-07-12 MED FILL — TERCONAZOLE 0.4% VAG CREAM: 0.4 | 7 days supply | Qty: 45 | Fill #1

## 2016-07-12 MED FILL — LIOTHYRONINE SOD 5 MCG TAB: 5 | 90 days supply | Qty: 180 | Fill #2

## 2016-07-12 MED FILL — metFORMIN HCL 1000 MG TABS: 1000 | 90 days supply | Qty: 180 | Fill #2

## 2016-07-25 MED FILL — SYNTHROID 100 MCG TABLET: 100 | 90 days supply | Qty: 90 | Fill #2

## 2016-07-31 MED FILL — UNIFINE PENTIPS 32GX5/32": 32G X 4 MM | 30 days supply | Qty: 200 | Fill #1

## 2016-07-31 MED FILL — LANTUS SOLOSTAR 100 UNITS/M: 100 | 90 days supply | Qty: 30 | Fill #1

## 2016-07-31 MED FILL — JARDIANCE 25 MG TABLET: 25 | 90 days supply | Qty: 90 | Fill #2

## 2016-07-31 MED FILL — LISINOPRIL 40 MG TABLET: 40 | 90 days supply | Qty: 90 | Fill #3

## 2016-07-31 MED FILL — UNIFINE PENTIPS 32GX5/32: 32G X 4 MM | 30 days supply | Qty: 200 | Fill #1

## 2016-08-03 MED FILL — HYDROCHLOROTHIAZIDE 25 MG T: 25 | 30 days supply | Qty: 30 | Fill #0

## 2016-08-17 DIAGNOSIS — H6980 Other specified disorders of Eustachian tube, unspecified ear: Secondary | ICD-10-CM | POA: Diagnosis not present

## 2016-08-17 DIAGNOSIS — J069 Acute upper respiratory infection, unspecified: Secondary | ICD-10-CM | POA: Diagnosis not present

## 2016-08-18 DIAGNOSIS — Z794 Long term (current) use of insulin: Secondary | ICD-10-CM | POA: Diagnosis not present

## 2016-08-18 DIAGNOSIS — E1165 Type 2 diabetes mellitus with hyperglycemia: Secondary | ICD-10-CM | POA: Diagnosis not present

## 2016-08-18 DIAGNOSIS — E039 Hypothyroidism, unspecified: Secondary | ICD-10-CM | POA: Diagnosis not present

## 2016-08-18 DIAGNOSIS — E669 Obesity, unspecified: Secondary | ICD-10-CM | POA: Diagnosis not present

## 2016-08-18 DIAGNOSIS — B373 Candidiasis of vulva and vagina: Secondary | ICD-10-CM | POA: Diagnosis not present

## 2016-08-18 DIAGNOSIS — Z5181 Encounter for therapeutic drug level monitoring: Secondary | ICD-10-CM | POA: Diagnosis not present

## 2016-08-18 DIAGNOSIS — Z6831 Body mass index (BMI) 31.0-31.9, adult: Secondary | ICD-10-CM | POA: Diagnosis not present

## 2016-08-18 DIAGNOSIS — Z79899 Other long term (current) drug therapy: Secondary | ICD-10-CM | POA: Diagnosis not present

## 2016-08-18 LAB — BASIC METABOLIC PANEL
BUN: 18 mg/dL (ref 4–21)
Creatinine: 0.8 mg/dL (ref <=1.1)
GLUCOSE: 139 mg/dL
Potassium: 4.4 mmol/L (ref 3.4–5.3)
Sodium: 136 mmol/L — AB (ref 137–147)

## 2016-08-18 LAB — HEPATIC FUNCTION PANEL
ALT: 44 U/L — AB (ref 7–35)
AST: 32 U/L (ref 13–35)
Alkaline Phosphatase: 84 U/L (ref 25–125)
Bilirubin, Total: 0.6 mg/dL

## 2016-08-18 LAB — LIPID PANEL
Cholesterol: 278 mg/dL — AB (ref 0–200)
HDL: 52 mg/dL (ref 35–70)
LDL Cholesterol: 167 mg/dL
Triglycerides: 295 mg/dL — AB (ref 40–160)

## 2016-08-18 LAB — TSH: TSH: 14.47 u[IU]/mL — AB (ref <=5.90)

## 2016-08-18 LAB — MICROALBUMIN, URINE

## 2016-08-18 LAB — VITAMIN B12: VITAMIN B12: 429

## 2016-08-18 LAB — HEMOGLOBIN A1C: Hemoglobin A1C: 8.4

## 2016-08-22 MED FILL — SYNTHROID 112 MCG TABLET: 112 | 30 days supply | Qty: 30 | Fill #0

## 2016-08-22 MED FILL — LIVALO 2 MG TABLET: 2 | 90 days supply | Qty: 180 | Fill #0

## 2016-08-24 MED FILL — TERCONAZOLE 0.4% VAG CREAM: 0.4 | 7 days supply | Qty: 45 | Fill #2

## 2016-08-28 ENCOUNTER — Other Ambulatory Visit: Payer: Self-pay | Admitting: Obstetrics and Gynecology

## 2016-08-28 DIAGNOSIS — R921 Mammographic calcification found on diagnostic imaging of breast: Secondary | ICD-10-CM

## 2016-09-05 ENCOUNTER — Ambulatory Visit
Admission: RE | Admit: 2016-09-05 | Discharge: 2016-09-05 | Disposition: A | Discharge status: Home / Self Care | Payer: 59 | Source: Ambulatory Visit | Attending: Obstetrics and Gynecology | Admitting: Obstetrics and Gynecology

## 2016-09-05 DIAGNOSIS — R921 Mammographic calcification found on diagnostic imaging of breast: Secondary | ICD-10-CM

## 2016-09-06 MED FILL — HYDROCHLOROTHIAZIDE 25 MG T: 25 | 30 days supply | Qty: 30 | Fill #1

## 2016-09-06 MED FILL — ALPRAZolam 0.25 MG TABS: 0.25 | 10 days supply | Qty: 30 | Fill #0

## 2016-09-19 ENCOUNTER — Ambulatory Visit
Admission: RE | Admit: 2016-09-19 | Discharge: 2016-09-19 | Disposition: A | Discharge status: Home / Self Care | Payer: 59 | Source: Ambulatory Visit | Attending: Obstetrics and Gynecology | Admitting: Obstetrics and Gynecology

## 2016-09-19 DIAGNOSIS — R921 Mammographic calcification found on diagnostic imaging of breast: Secondary | ICD-10-CM | POA: Diagnosis not present

## 2016-09-25 MED FILL — SYNTHROID 112 MCG TABLET: 112 | 30 days supply | Qty: 30 | Fill #1

## 2016-09-25 MED FILL — MICROLET LANCETS: 50 days supply | Qty: 100 | Fill #0

## 2016-09-26 MED FILL — CONTOUR NEXT STRIPS: 50 days supply | Qty: 100 | Fill #0

## 2016-09-27 ENCOUNTER — Ambulatory Visit (INDEPENDENT_AMBULATORY_CARE_PROVIDER_SITE_OTHER): Payer: 59 | Admitting: Family Medicine

## 2016-09-27 ENCOUNTER — Encounter: Payer: Self-pay | Admitting: Family Medicine

## 2016-09-27 VITALS — BP 129/83 | HR 79 | Ht 65.5 in | Wt 191.7 lb

## 2016-09-27 DIAGNOSIS — I10 Essential (primary) hypertension: Secondary | ICD-10-CM

## 2016-09-27 DIAGNOSIS — E1169 Type 2 diabetes mellitus with other specified complication: Secondary | ICD-10-CM | POA: Insufficient documentation

## 2016-09-27 DIAGNOSIS — J3089 Other allergic rhinitis: Secondary | ICD-10-CM | POA: Insufficient documentation

## 2016-09-27 DIAGNOSIS — E118 Type 2 diabetes mellitus with unspecified complications: Secondary | ICD-10-CM | POA: Diagnosis not present

## 2016-09-27 DIAGNOSIS — E669 Obesity, unspecified: Secondary | ICD-10-CM

## 2016-09-27 DIAGNOSIS — E782 Mixed hyperlipidemia: Secondary | ICD-10-CM

## 2016-09-27 DIAGNOSIS — D649 Anemia, unspecified: Secondary | ICD-10-CM

## 2016-09-27 DIAGNOSIS — E559 Vitamin D deficiency, unspecified: Secondary | ICD-10-CM

## 2016-09-27 DIAGNOSIS — E785 Hyperlipidemia, unspecified: Secondary | ICD-10-CM

## 2016-09-27 DIAGNOSIS — E039 Hypothyroidism, unspecified: Secondary | ICD-10-CM | POA: Diagnosis not present

## 2016-09-27 DIAGNOSIS — I152 Hypertension secondary to endocrine disorders: Secondary | ICD-10-CM | POA: Insufficient documentation

## 2016-09-27 DIAGNOSIS — E663 Overweight: Secondary | ICD-10-CM | POA: Insufficient documentation

## 2016-09-27 DIAGNOSIS — B009 Herpesviral infection, unspecified: Secondary | ICD-10-CM | POA: Insufficient documentation

## 2016-09-27 DIAGNOSIS — E1159 Type 2 diabetes mellitus with other circulatory complications: Secondary | ICD-10-CM | POA: Insufficient documentation

## 2016-09-27 NOTE — Assessment & Plan Note (Addendum)
Is supposed to be on fe supp- not used regularly due to s-e constipation.  - We discussed preventative strategies - We'll check CBC near future.

## 2016-09-27 NOTE — Patient Instructions (Addendum)
Will loook at labs and let you know what else I recommend. F/up Mid-jan   Please realize, EXERCISE IS MEDICINE!  -  American Heart Association [facility]) guidelines for exercise : If you are in good health, without any medical conditions, you should engage in 150 minutes of moderate intensity aerobic activity per week.  This means you should be huffing and puffing throughout your workout.   Engaging in regular exercise will improve brain function and memory, as well as improve mood, boost immune system and help with weight management.  As well as the other, more well-known effects of exercise such as decreasing blood sugar levels, decreasing blood pressure,  and decreasing bad cholesterol levels/ increasing good cholesterol levels.     -  The AHA strongly endorses consumption of a diet that contains a variety of foods from all the food categories with an emphasis on fruits and vegetables; fat-free and low-fat dairy products; cereal and grain products; legumes and nuts; and fish, poultry, and/or extra lean meats.    Excessive food intake, especially of foods high in saturated and trans fats, sugar, and salt, should be avoided.    Adequate water intake of roughly 1/2 of your weight in pounds, should equal the ounces of water per day you should drink.  So for instance, if you're 200 pounds, that would be 100 ounces of water per day.         Mediterranean Diet  Why follow it? Research shows. . Those who follow the Mediterranean diet have a reduced risk of heart disease  . The diet is associated with a reduced incidence of Parkinson's and Alzheimer's diseases . People following the diet may have longer life expectancies and lower rates of chronic diseases  . The Dietary Guidelines for Americans recommends the Mediterranean diet as an eating plan to promote health and prevent disease  What Is the Mediterranean Diet?  . Healthy eating plan based on typical foods and recipes of Mediterranean-style  cooking . The diet is primarily a plant based diet; these foods should make up a majority of meals   Starches - Plant based foods should make up a majority of meals - They are an important sources of vitamins, minerals, energy, antioxidants, and fiber - Choose whole grains, foods high in fiber and minimally processed items  - Typical grain sources include wheat, oats, barley, corn, brown rice, bulgar, farro, millet, polenta, couscous  - Various types of beans include chickpeas, lentils, fava beans, black beans, white beans   Fruits  Veggies - Large quantities of antioxidant rich fruits & veggies; 6 or more servings  - Vegetables can be eaten raw or lightly drizzled with oil and cooked  - Vegetables common to the traditional Mediterranean Diet include: artichokes, arugula, beets, broccoli, brussel sprouts, cabbage, carrots, celery, collard greens, cucumbers, eggplant, kale, leeks, lemons, lettuce, mushrooms, okra, onions, peas, peppers, potatoes, pumpkin, radishes, rutabaga, shallots, spinach, sweet potatoes, turnips, zucchini - Fruits common to the Mediterranean Diet include: apples, apricots, avocados, cherries, clementines, dates, figs, grapefruits, grapes, melons, nectarines, oranges, peaches, pears, pomegranates, strawberries, tangerines  Fats - Replace butter and margarine with healthy oils, such as olive oil, canola oil, and tahini  - Limit nuts to no more than a handful a day  - Nuts include walnuts, almonds, pecans, pistachios, pine nuts  - Limit or avoid candied, honey roasted or heavily salted nuts - Olives are central to the Mediterranean diet - can be eaten whole or used in a variety of dishes  Meats Protein - Limiting red meat: no more than a few times a month - When eating red meat: choose lean cuts and keep the portion to the size of deck of cards - Eggs: approx. 0 to 4 times a week  - Fish and lean poultry: at least 2 a week  - Healthy protein sources include, chicken, [place],  lean beef, lamb - Increase intake of seafood such as tuna, salmon, trout, mackerel, shrimp, scallops - Avoid or limit high fat processed meats such as sausage and bacon  Dairy - Include moderate amounts of low fat dairy products  - Focus on healthy dairy such as fat free yogurt, skim milk, low or reduced fat cheese - Limit dairy products higher in fat such as whole or 2% milk, cheese, ice cream  Alcohol - Moderate amounts of red wine is ok  - No more than 5 oz daily for women (all ages) and men older than age 34  - No more than 10 oz of wine daily for men younger than 86  Other - Limit sweets and other desserts  - Use herbs and spices instead of salt to flavor foods  - Herbs and spices common to the traditional Mediterranean Diet include: basil, bay leaves, chives, cloves, cumin, fennel, garlic, lavender, marjoram, mint, oregano, parsley, pepper, rosemary, sage, savory, sumac, tarragon, thyme   It's not just a diet, it's a lifestyle:  . The Mediterranean diet includes lifestyle factors typical of those in the region  . Foods, drinks and meals are best eaten with others and savored . Daily physical activity is important for overall good health . This could be strenuous exercise like running and aerobics . This could also be more leisurely activities such as walking, housework, yard-work, or taking the stairs . Moderation is the key; a balanced and healthy diet accommodates most foods and drinks . Consider portion sizes and frequency of consumption of certain foods   Meal Ideas & Options:  . Breakfast:  o Whole wheat toast or whole wheat English muffins with peanut butter & hard boiled egg o Steel cut oats topped with apples & cinnamon and skim milk  o Fresh fruit: banana, strawberries, melon, berries, peaches  o Smoothies: strawberries, bananas, greek yogurt, peanut butter o Low fat greek yogurt with blueberries and granola  o Egg white omelet with spinach and mushrooms o Breakfast  couscous: whole wheat couscous, apricots, skim milk, cranberries  . Sandwiches:  o Hummus and grilled vegetables (peppers, zucchini, squash) on whole wheat bread   o Grilled chicken on whole wheat pita with lettuce, tomatoes, cucumbers or tzatziki  o Tuna salad on whole wheat bread: tuna salad made with greek yogurt, olives, red peppers, capers, green onions o Garlic rosemary lamb pita: lamb sauted with garlic, rosemary, salt & pepper; add lettuce, cucumber, greek yogurt to pita - flavor with lemon juice and black pepper  . Seafood:  o Mediterranean grilled salmon, seasoned with garlic, basil, parsley, lemon juice and black pepper o Shrimp, lemon, and spinach whole-grain pasta salad made with low fat greek yogurt  o Seared scallops with lemon orzo  o Seared tuna steaks seasoned salt, pepper, coriander topped with tomato mixture of olives, tomatoes, olive oil, minced garlic, parsley, green onions and cappers  . Meats:  o Herbed greek chicken salad with kalamata olives, cucumber, feta  o Red bell peppers stuffed with spinach, bulgur, lean ground beef (or lentils) & topped with feta   o Kebabs: skewers of chicken, tomatoes, onions,  zucchini, squash  o [place] burgers: made with red onions, mint, dill, lemon juice, feta cheese topped with roasted red peppers . Vegetarian o Cucumber salad: cucumbers, artichoke hearts, celery, red onion, feta cheese, tossed in olive oil & lemon juice  o Hummus and whole grain pita points with a greek salad (lettuce, tomato, feta, olives, cucumbers, red onion) o Lentil soup with celery, carrots made with vegetable broth, garlic, salt and pepper  o Tabouli salad: parsley, bulgur, mint, scallions, cucumbers, tomato, radishes, lemon juice, olive oil, salt and pepper.

## 2016-09-27 NOTE — Assessment & Plan Note (Addendum)
  FBS- running about 160's lately and I reviewed goal fasting blood sugar and two-hour postprandials with patient. Follow-up with Dr. Buddy Duty regarding possible changes in treatment. Reminded patient of yearly foot and eye exams-which she gets every January. - Prudent diet discussed

## 2016-09-27 NOTE — Assessment & Plan Note (Deleted)
Bone density--> 2016- WNl's- q 2 yrs.

## 2016-09-27 NOTE — Assessment & Plan Note (Addendum)
Patient understands blood pressure not at goal - Goal blood pressure less than 130/80 - Advised to monitor at home and if still remains above goal once no longer ill, return to clinic sooner than planned - Obtain CMP, TSH near future. - No red flag symptoms-= if any develop, to urgent care or ER.

## 2016-09-27 NOTE — Assessment & Plan Note (Addendum)
(  Dr Buddy Duty has her on Pitavastatin.  Can't take lipitor etc due to severe myalgias.) - Low saturated and transplant diet discussed with patient. - We'll check fasting lipid profile 6 months from when last checked.

## 2016-09-27 NOTE — Progress Notes (Signed)
New patient office visit note:  Impression and Recommendations:    1. Type 2 diabetes mellitus with complication, unspecified long term insulin use status ([place])   2. Essential hypertension   3. Mixed hyperlipidemia   4. Hypothyroidism, unspecified type   5. Vitamin D deficiency   6. Anemia, unspecified type   7. Obesity, Class I, BMI 30-34.9     Diabetes mellitus ([facility]) FBS- running about 160's lately and I reviewed goal fasting blood sugar and two-hour postprandials with patient. Follow-up with Dr. Buddy Duty regarding possible changes in treatment. Reminded patient of yearly foot and eye exams-which she gets every January. - Prudent diet discussed   Hypertension Patient understands blood pressure not at goal - Goal blood pressure less than 130/80 - Advised to monitor at home and if still remains above goal once no longer ill, return to clinic sooner than planned - Obtain CMP, TSH near future. - No red flag symptoms-= if any develop, to urgent care or ER.   Anemia Is supposed to be on fe supp- not used regularly due to s-e constipation.  - We discussed preventative strategies - We'll check CBC near future.   Mixed hyperlipidemia (Dr Buddy Duty has her on Pitavastatin.  Can't take lipitor etc due to severe myalgias.) - Low saturated and transplant diet discussed with patient. - We'll check fasting lipid profile 6 months from when last checked.   Vitamin D deficiency We'll check vitamin D levels with future labs.   Hypothyroidism We'll check TSH near future. - Managed by Dr. Buddy Duty of endocrinology along with her diabetes.   Obesity, Class I, BMI 30-34.9 Weight loss advised.  Discussed prudent diet and exercise  Handouts given   New Prescriptions   ALBUTEROL (PROVENTIL HFA;VENTOLIN HFA) 108 (90 BASE) MCG/ACT INHALER    Inhale 2 puffs into the lungs every 6 (six) hours as needed for wheezing.   AMOXICILLIN-CLAVULANATE (AUGMENTIN) 875-125 MG TABLET    Take 1  tablet by mouth 2 (two) times daily.   CHLORPHENIRAMINE-HYDROCODONE (TUSSIONEX) 10-8 MG/5ML SUER    Take 5 mLs by mouth every 12 (twelve) hours as needed for cough (cough, will cause drowsiness.).   PREDNISONE (DELTASONE) 20 MG TABLET    Take 3 tabs po * 2 days, then 2 tabs for 2 d, then 1 tab 2 d, then 1/2 tab 2 days.    Modified Medications   No medications on file    Discontinued Medications   ALPRAZOLAM (XANAX) 0.25 MG TABLET    Take 0.125 mg by mouth at bedtime as needed for anxiety. Reported on 02/11/2016   CHOLECALCIFEROL (VITAMIN D) 1000 UNITS TABLET    Take 1,000 Units by mouth daily.   CLOTRIMAZOLE-BETAMETHASONE (LOTRISONE) CREAM    Apply 1 application topically 2 (two) times daily.   CYANOCOBALAMIN (B-12) 5000 MCG SUBL    Place 1 tablet under the tongue daily.   EMPAGLIFLOZIN (JARDIANCE) 25 MG TABS TABLET    Take 25 mg by mouth daily.   LEVOTHYROXINE (SYNTHROID, LEVOTHROID) 100 MCG TABLET    Take 100 mcg by mouth daily before breakfast.   MELOXICAM (MOBIC) 7.5 MG TABLET    Take 7.5 mg by mouth daily as needed for pain.   SYNTHROID 112 MCG TABLET    Take 1 tablet by mouth daily.    Return in about 4 weeks (around 10/25/2016) for Interested in talking about diet and weight loss in the future..  The patient was counseled, risk factors were discussed, anticipatory  guidance given.  Gross side effects, risk and benefits, and alternatives of medications discussed with patient.  Patient is aware that all medications have potential side effects and we are unable to predict every side effect or drug-drug interaction that may occur.  Expresses verbal understanding and consents to current therapy plan and treatment regimen.  Please see AVS handed out to patient at the end of our visit for further patient instructions/ counseling done pertaining to today's office visit.    Note: This document was prepared using Dragon voice recognition software and may include unintentional dictation  errors.  ----------------------------------------------------------------------------------------------------------------------    Subjective:    Chief Complaint  Patient presents with  . Establish Care    HPI: Robin Greene is a pleasant 60 y.o. female who presents to [place] [facility] today to review their medical history with me and establish care.   I asked the patient to review their chronic problem list with me to ensure everything was updated and accurate.     Kelton Pillar prior PCP.    Patient is a Therapist, sports at [employer]. Originally from [place]. Married to her husband Elta Guadeloupe. No kids, 2 miscarriages.  No exercise, poor diet, not satisfied with weight.  Never smoked, drinks socially, no drugs. Never had an STD and is not interested in testing for it.  Lost 30lbs  1 yr ago using contrave -->  Interested in talking about diet and weight loss in the future.  Patient has had diabetes, hypertension and hyperlipidemia on meds for approximately 8 years now.   Not checking BS, BP's at home.  Tells me has been well controlled- but goes up as she gains weight which she has recently.  Dr. Buddy Duty of endocrinology in the past has treated her; and likely will continue to treat hypothyroidism and diabetes.   Patient Care Team    Relationship Specialty Notifications Start End  Mellody Dance, DO PCP - General Family Medicine  09/27/16   Joretta Bachelor, [facility] Care Management Pharmacist  01/15/15   Ronald Lobo, MD Consulting Physician Gastroenterology  09/27/16   Delrae Rend, MD Consulting Physician Endocrinology  09/27/16   Druscilla Brownie, MD Consulting Physician Dermatology  09/27/16   Arvella Nigh, MD Consulting Physician Obstetrics and Gynecology  09/27/16   Vevelyn Royals, MD Consulting Physician Ophthalmology  09/27/16      Wt Readings from Last 3 Encounters:  10/25/16 194 lb (88 kg)  10/18/16 196 lb 8 oz (89.1 kg)  09/27/16 191 lb  11.2 oz (87 kg)   BP Readings from Last 3 Encounters:  10/25/16 (!) 177/89  10/18/16 (!) 165/83  09/27/16 129/83   Pulse Readings from Last 3 Encounters:  10/25/16 82  10/18/16 85  09/27/16 79   BMI Readings from Last 3 Encounters:  10/25/16 31.79 kg/m  10/18/16 32.20 kg/m  09/27/16 31.42 kg/m   Lab Results  Component Value Date   HGBA1C 8.4 08/18/2016    Patient Active Problem List   Diagnosis Date Noted  . Hypertension 09/27/2016    Priority: High  . Mixed hyperlipidemia 09/27/2016    Priority: High  . Diabetes mellitus ([place]) 02/07/2013    Priority: High  . Hypothyroidism 09/27/2016    Priority: Medium  . Obesity, Class I, BMI 30-34.9 09/27/2016    Priority: Medium  . Vitamin D deficiency 09/27/2016    Priority: Low  . HSV-1 infection 09/27/2016    Priority: Low  . Anemia 09/27/2016    Priority:  Low  . Environmental and seasonal allergies 09/27/2016    Priority: Low  . Noncompliance with treatment regimen 10/18/2016     Past Medical History:  Diagnosis Date  . Anemia   . Diabetes mellitus   . HSV-1 infection   . Hypertension   . Vitamin D deficiency      Past Surgical History:  Procedure Laterality Date  . ABDOMINAL HYSTERECTOMY    . OVARY SURGERY    . REFRACTIVE SURGERY       Family History  Problem Relation Age of Onset  . Sjogren's syndrome Mother   . Hypertension Father   . Hyperlipidemia Father   . Healthy Sister      History  Drug Use No    History  Alcohol Use  . Yes    Comment: occasionally    History  Smoking Status  . Never Smoker  Smokeless Tobacco  . Never Used    Patient's Medications  New Prescriptions   ALBUTEROL (PROVENTIL HFA;VENTOLIN HFA) 108 (90 BASE) MCG/ACT INHALER    Inhale 2 puffs into the lungs every 6 (six) hours as needed for wheezing.   AMOXICILLIN-CLAVULANATE (AUGMENTIN) 875-125 MG TABLET    Take 1 tablet by mouth 2 (two) times daily.   CHLORPHENIRAMINE-HYDROCODONE (TUSSIONEX) 10-8 MG/5ML  SUER    Take 5 mLs by mouth every 12 (twelve) hours as needed for cough (cough, will cause drowsiness.).   PREDNISONE (DELTASONE) 20 MG TABLET    Take 3 tabs po * 2 days, then 2 tabs for 2 d, then 1 tab 2 d, then 1/2 tab 2 days.  Previous Medications   CETIRIZINE (ZYRTEC) 10 MG TABLET    Take 10 mg by mouth daily.   FERROUS SULFATE 325 (65 FE) MG TABLET    Take 325 mg by mouth every other day.   GLUCOSE BLOOD TEST STRIP    Inject 1 each into the skin as needed. Use as instructed   HYDROCHLOROTHIAZIDE (HYDRODIURIL) 25 MG TABLET    Take 1 tablet by mouth daily.   INSULIN GLARGINE (LANTUS) 100 UNIT/ML INJECTION    Inject 42 Units into the skin at bedtime.    LIOTHYRONINE (CYTOMEL) 5 MCG TABLET    Take 5 mcg by mouth 2 (two) times daily.   LISINOPRIL (PRINIVIL,ZESTRIL) 40 MG TABLET    Take 40 mg by mouth daily.   METFORMIN (GLUCOPHAGE) 1000 MG TABLET    Take 1,000 mg by mouth 2 (two) times daily with a meal.   METRONIDAZOLE (METROGEL) 1 % GEL    Apply 1 application topically daily as needed.    OMEGA-3 FATTY ACIDS (FISH OIL) 1000 MG CAPS    Take 1,000 mg by mouth 2 (two) times daily.   PITAVASTATIN CALCIUM (LIVALO) 1 MG TABS    Take 1 tablet by mouth daily.    PROBIOTIC PRODUCT (PROBIOTIC DAILY) CAPS    Take 1 tablet by mouth daily.   SYNTHROID 137 MCG TABLET    Take 1 tablet by mouth daily.   VALACYCLOVIR (VALTREX) 1000 MG TABLET    Take 500 mg by mouth 2 (two) times daily. 1/2 tablet twice daily for 5 days as needed for flares   VITAMIN D, CHOLECALCIFEROL, 1000 UNITS CAPS    Take 2,000 capsules by mouth daily.  Modified Medications   No medications on file  Discontinued Medications   ALPRAZOLAM (XANAX) 0.25 MG TABLET    Take 0.125 mg by mouth at bedtime as needed for anxiety. Reported on 02/11/2016  CHOLECALCIFEROL (VITAMIN D) 1000 UNITS TABLET    Take 1,000 Units by mouth daily.   CLOTRIMAZOLE-BETAMETHASONE (LOTRISONE) CREAM    Apply 1 application topically 2 (two) times daily.    CYANOCOBALAMIN (B-12) 5000 MCG SUBL    Place 1 tablet under the tongue daily.   EMPAGLIFLOZIN (JARDIANCE) 25 MG TABS TABLET    Take 25 mg by mouth daily.   LEVOTHYROXINE (SYNTHROID, LEVOTHROID) 100 MCG TABLET    Take 100 mcg by mouth daily before breakfast.   MELOXICAM (MOBIC) 7.5 MG TABLET    Take 7.5 mg by mouth daily as needed for pain.   SYNTHROID 112 MCG TABLET    Take 1 tablet by mouth daily.    Allergies: Asa [aspirin]; Claritin [loratadine]; Crestor [rosuvastatin calcium]; Invokana [canagliflozin]; Jardiance [empagliflozin]; Lipitor [atorvastatin]; and Simvastatin  Review of Systems  Constitutional: Negative for chills, diaphoresis, fever, malaise/fatigue and weight loss.  HENT: Negative for congestion, sore throat and tinnitus.   Eyes: Negative for blurred vision, double vision and photophobia.  Respiratory: Negative for cough and wheezing.   Cardiovascular: Negative for chest pain and palpitations.  Gastrointestinal: Negative for blood in stool, diarrhea, nausea and vomiting.  Genitourinary: Negative for dysuria, frequency and urgency.  Musculoskeletal: Negative for joint pain and myalgias.  Skin: Negative for itching and rash.  Neurological: Negative for dizziness, focal weakness, weakness and headaches.  Endo/Heme/Allergies: Positive for environmental allergies. Negative for polydipsia. Does not bruise/bleed easily.  Psychiatric/Behavioral: Negative for depression and memory loss. The patient is not nervous/anxious and does not have insomnia.      Objective:   Blood pressure 129/83, pulse 79, height 5' 5.5" (1.664 m), weight 191 lb 11.2 oz (87 kg). Body mass index is 31.42 kg/m. General: Well Developed, well nourished, and in no acute distress.  Neuro: Alert and oriented x3, extra-ocular muscles intact, sensation grossly intact.  HEENT: Normocephalic, atraumatic, pupils equal round reactive to light, neck supple;   Cardiac: Regular rate and rhythm, no murmurs rubs or  gallops.  Respiratory: Essentially clear to auscultation bilaterally,  Not using accessory muscles, speaking in full sentences.  Abdominal: Soft, not grossly distended  Musculoskeletal: Ambulates w/o diff, FROM * 4 ext.  Vasc: less 2 sec cap RF, warm and pink  Psych:  No HI/SI, judgement and insight good, Euthymic mood. Full Affect.

## 2016-09-28 DIAGNOSIS — E039 Hypothyroidism, unspecified: Secondary | ICD-10-CM | POA: Diagnosis not present

## 2016-09-29 MED FILL — SYNTHROID 137 MCG TABLET: 137 | 30 days supply | Qty: 30 | Fill #0

## 2016-10-11 MED FILL — LIOTHYRONINE SOD 5 MCG TAB: 5 | 90 days supply | Qty: 180 | Fill #3

## 2016-10-11 MED FILL — HYDROCHLOROTHIAZIDE 25 MG T: 25 | 30 days supply | Qty: 30 | Fill #2

## 2016-10-11 MED FILL — metFORMIN HCL 1000 MG TABS: 1000 | 90 days supply | Qty: 180 | Fill #3

## 2016-10-12 MED FILL — LANTUS SOLOSTAR 100 UNITS/M: 100 | 85 days supply | Qty: 36 | Fill #0

## 2016-10-18 ENCOUNTER — Ambulatory Visit (INDEPENDENT_AMBULATORY_CARE_PROVIDER_SITE_OTHER): Payer: 59 | Admitting: Family Medicine

## 2016-10-18 ENCOUNTER — Encounter: Payer: Self-pay | Admitting: Family Medicine

## 2016-10-18 VITALS — BP 165/83 | HR 85 | Temp 97.9°F | Ht 65.5 in | Wt 196.5 lb

## 2016-10-18 DIAGNOSIS — B3731 Acute candidiasis of vulva and vagina: Secondary | ICD-10-CM

## 2016-10-18 DIAGNOSIS — R062 Wheezing: Secondary | ICD-10-CM | POA: Diagnosis not present

## 2016-10-18 DIAGNOSIS — B373 Candidiasis of vulva and vagina: Secondary | ICD-10-CM

## 2016-10-18 DIAGNOSIS — Z91199 Patient's noncompliance with other medical treatment and regimen due to unspecified reason: Secondary | ICD-10-CM | POA: Insufficient documentation

## 2016-10-18 DIAGNOSIS — J069 Acute upper respiratory infection, unspecified: Secondary | ICD-10-CM | POA: Diagnosis not present

## 2016-10-18 DIAGNOSIS — J0120 Acute ethmoidal sinusitis, unspecified: Secondary | ICD-10-CM

## 2016-10-18 DIAGNOSIS — I10 Essential (primary) hypertension: Secondary | ICD-10-CM

## 2016-10-18 DIAGNOSIS — Z9119 Patient's noncompliance with other medical treatment and regimen: Secondary | ICD-10-CM

## 2016-10-18 MED ORDER — ALBUTEROL SULFATE HFA 108 (90 BASE) MCG/ACT IN AERS: 2.0000 | INHALATION_SPRAY | Freq: Four times a day (QID) | RESPIRATORY_TRACT | 1 refills | Status: DC | PRN

## 2016-10-18 MED ORDER — AMOXICILLIN-POT CLAVULANATE 875-125 MG PO TABS: 1.0000 | ORAL_TABLET | Freq: Two times a day (BID) | ORAL | 0 refills | Status: DC

## 2016-10-18 MED ORDER — FLUCONAZOLE 150 MG PO TABS: 150.0000 mg | ORAL_TABLET | Freq: Once | ORAL | 1 refills | Status: AC

## 2016-10-18 MED ORDER — METHYLPREDNISOLONE ACETATE 40 MG/ML IJ SUSP
40.0000 mg | Freq: Once | INTRAMUSCULAR | Status: AC
  Administered 2016-10-18: 40 mg via INTRAMUSCULAR

## 2016-10-18 MED ORDER — HYDROCOD POLST-CPM POLST ER 10-8 MG/5ML PO SUER: 5.0000 mL | Freq: Two times a day (BID) | ORAL | 0 refills | Status: DC | PRN

## 2016-10-18 MED ORDER — PREDNISONE 20 MG PO TABS: ORAL_TABLET | ORAL | 0 refills | Status: DC

## 2016-10-18 MED FILL — AMOX-CLAV 875-125 MG TABLET: 875-125 | 10 days supply | Qty: 20 | Fill #0

## 2016-10-18 MED FILL — VENTOLIN HFA 90 MCG INHALER: 108 (90 BAS | 25 days supply | Qty: 18 | Fill #0

## 2016-10-18 MED FILL — HYDROCODONE-CHLORPHENIRAM S: 10-8 | 20 days supply | Qty: 200 | Fill #0

## 2016-10-18 MED FILL — predniSONE 20 MG TABS: 20 | 8 days supply | Qty: 15 | Fill #0

## 2016-10-18 MED FILL — FLUCONAZOLE 150 MG TABLET: 150 | 7 days supply | Qty: 2 | Fill #0

## 2016-10-18 NOTE — Patient Instructions (Addendum)
Robin Greene please give her a note to be out of work for the rest of the weekend and can return on Monday as long as she is fever free for 24 hours and off antipyretics   Off work til Monday-  Please give work note

## 2016-10-18 NOTE — Progress Notes (Signed)
Acute Care Office visit  Assessment and plan:  1. Acute ethmoidal sinusitis, recurrence not specified   2. Candidiasis of vagina   3. URI with cough and congestion   4. Wheezing   5. Essential hypertension   6. Noncompliance with treatment regimen    URI with cough and congestion - Supportive care with over-the-counter medicines in addition to prescribed ones discussed with patient.  - Precautions with Tussionex discussed with patient.  Acute ethmoidal sinusitis - Use Augmentin if no improvement or worse over the next 3-5 days   Wheezing Albuterol when necessary-2 puffs as needed for shortness of breath or wheeze  Prednisone taper  Supportive care discussed with patient.  If develops any red flag symptoms which we discussed, call us or to urgent care as she will need a chest x-ray and possibly other testing.  Hypertension Patient understands blood pressure not at goal- likely secondary to over-the-counter decongestants-Sudafed she's been taking. - Goal blood pressure less than 130/80 - Advised to monitor at home and if still remains above goal once no longer ill, return to clinic sooner than planned - Recommend Coricidin HBP if she feels necessary - No red flag symptoms.  Candidiasis of vagina Prescription for Diflucan sent for patient per her request since she always gets vaginal yeast infections with antibiotic use.  Noncompliance with treatment regimen Patient going through a difficult personal time right now with recent deaths in her extended family.  - She declines any medications to help her through this emotional distress at this time.  Reminded patient of importance of compliance with her chronic medications.  Anticipatory guidance and routine counseling done re: condition, txmnt options and need for follow up. All questions of patient's were answered.  - Viral vs Allergic vs Bacterial causes for pt's symptoms reveiwed.    - Supportive care and  various OTC medications discussed in addition to any prescribed. - Call or RTC if new symptoms, or if no improvement or worse over next couple days.    New Prescriptions   ALBUTEROL (PROVENTIL HFA;VENTOLIN HFA) 108 (90 BASE) MCG/ACT INHALER    Inhale 2 puffs into the lungs every 6 (six) hours as needed for wheezing.   AMOXICILLIN-CLAVULANATE (AUGMENTIN) 875-125 MG TABLET    Take 1 tablet by mouth 2 (two) times daily.   CHLORPHENIRAMINE-HYDROCODONE (TUSSIONEX) 10-8 MG/5ML SUER    Take 5 mLs by mouth every 12 (twelve) hours as needed for cough (cough, will cause drowsiness.).   PREDNISONE (DELTASONE) 20 MG TABLET    Take 3 tabs po * 2 days, then 2 tabs for 2 d, then 1 tab 2 d, then 1/2 tab 2 days.   Gross side effects, risk and benefits, and alternatives of medications discussed with patient.  Patient is aware that all medications have potential side effects and we are unable to predict every sideeffect or drug-drug interaction that may occur.  Expresses verbal understanding and consents to current therapy plan and treatment regiment.  Return if symptoms worsen or fail to improve, for Also: f-up BPrecheck 1 mo -but pt prefers sooner- she will make f/up .  Please see AVS handed out to patient at the end of our visit for additional patient instructions/ counseling done pertaining to today's office visit.  Note: This document was prepared using Dragon voice recognition software and may include unintentional dictation errors.    Subjective:    Chief Complaint  Patient presents with  . URI    HPI:  Pt presents with URI  sx for 2 wks before thanksgiving- viral infection - Mucinex DM- little help.  Ever since dry hacking cough and pain in both ears- deep in there/ into throat. Stopped mucinex.    --->  Lost father in law x-mas ever and aunt.  Pt under a lot of stress.   Took a lot sudafed. Has not been real consistent with taking her medicines.  - felt tight in chest, maybe wheezing, a lot head  congestion, RN nose, L side dface hurts more than R.     Denies objective F/  + chills , No face pain, vomiting.     Sudafed and Mucinex DM taken anything for sx.    Overall getting worse.     Patient Active Problem List   Diagnosis Date Noted  . Hypertension 09/27/2016    Priority: High  . Mixed hyperlipidemia 09/27/2016    Priority: High  . Diabetes mellitus ([place]) 02/07/2013    Priority: High  . Hypothyroidism 09/27/2016    Priority: Medium  . Obesity, Class I, BMI 30-34.9 09/27/2016    Priority: Medium  . Vitamin D deficiency 09/27/2016    Priority: Low  . HSV-1 infection 09/27/2016    Priority: Low  . Anemia 09/27/2016    Priority: Low  . Environmental and seasonal allergies 09/27/2016    Priority: Low  . Noncompliance with treatment regimen 10/18/2016    Past medical history, Surgical history, Family history reviewed and noted below, Social history, Allergies, and Medications have been entered into the medical record, reviewed and changed as needed.   Allergies  Allergen Reactions  . Asa [Aspirin] Anaphylaxis  . Claritin [Loratadine] Other (See Comments)    Fainted twice   . Crestor [Rosuvastatin Calcium] Other (See Comments)    Abnormal LFTs  . Invokana [Canagliflozin] Other (See Comments)    Genital yeast infections  . Jardiance [Empagliflozin] Other (See Comments)    Genital yeast infection  . Lipitor [Atorvastatin] Other (See Comments)    Muscle aches   . Simvastatin Other (See Comments)    Muscle aches    Review of Systems  Constitutional: Positive for chills, diaphoresis, fever and malaise/fatigue.  HENT: Positive for congestion, ear pain, sinus pain and sore throat. Negative for ear discharge.   Eyes: Positive for pain. Negative for discharge.  Respiratory: Positive for cough, sputum production and wheezing.        Short of breath with tightness in chest  Cardiovascular: Negative for chest pain and palpitations.       On with breathing    Gastrointestinal: Positive for diarrhea and nausea. Negative for vomiting.  Genitourinary: Negative for dysuria, frequency and urgency.  Musculoskeletal: Positive for myalgias.       Generalized Achiness  Neurological: Positive for headaches. Negative for dizziness.  Endo/Heme/Allergies: Negative for environmental allergies.  Psychiatric/Behavioral: The patient has insomnia.        Due to coughing    Objective:   Blood pressure (!) 165/83, pulse 85, temperature 97.9 F (36.6 C), temperature source Oral, height 5' 5.5" (1.664 m), weight 196 lb 8 oz (89.1 kg). Body mass index is 32.2 kg/m. General: Well Developed, well nourished, appropriate for stated age.  Neuro: Alert and oriented x3, extra-ocular muscles intact, sensation grossly intact.  HEENT: Normocephalic, atraumatic, pupils equal round reactive to light, neck supple, no masses, no painful lymphadenopathy, TM's intact B/L, no acute findings. Nares- patent, clear d/c, OP- clear, mild erythema, + TTP sinuses- globally Skin: Warm and dry, no gross rash. Cardiac: RRR, S1  S2,  no murmurs rubs or gallops.  Respiratory: ECTA B/L and A/P- occasional scattered wheezes throughout-not localized, Not using accessory muscles, speaking in full sentences- unlabored. Vascular:  No gross lower ext edema, cap RF less 2 sec. Psych: No HI/SI, judgement and insight good, Euthymic mood. Full Affect.   Patient Care Team    Relationship Specialty Notifications Start End  Mellody Dance, DO PCP - General Family Medicine  09/27/16   Joretta Bachelor, [facility] Management Pharmacist  01/15/15   Ronald Lobo, MD Consulting Physician Gastroenterology  09/27/16   Delrae Rend, MD Consulting Physician Endocrinology  09/27/16   Druscilla Brownie, MD Consulting Physician Dermatology  09/27/16   Arvella Nigh, MD Consulting Physician Obstetrics and Gynecology  09/27/16   Vevelyn Royals, MD Consulting Physician Ophthalmology  09/27/16

## 2016-10-25 ENCOUNTER — Ambulatory Visit (INDEPENDENT_AMBULATORY_CARE_PROVIDER_SITE_OTHER): Payer: 59 | Admitting: Family Medicine

## 2016-10-25 ENCOUNTER — Encounter: Payer: Self-pay | Admitting: Family Medicine

## 2016-10-25 VITALS — BP 177/89 | HR 82 | Resp 18 | Wt 194.0 lb

## 2016-10-25 DIAGNOSIS — Z9119 Patient's noncompliance with other medical treatment and regimen: Secondary | ICD-10-CM

## 2016-10-25 DIAGNOSIS — E669 Obesity, unspecified: Secondary | ICD-10-CM

## 2016-10-25 DIAGNOSIS — Z91199 Patient's noncompliance with other medical treatment and regimen due to unspecified reason: Secondary | ICD-10-CM

## 2016-10-25 DIAGNOSIS — F430 Acute stress reaction: Secondary | ICD-10-CM | POA: Diagnosis not present

## 2016-10-25 DIAGNOSIS — I10 Essential (primary) hypertension: Secondary | ICD-10-CM

## 2016-10-25 NOTE — Progress Notes (Signed)
Impression and Recommendations:    1. Essential hypertension   2. Obesity, Class I, BMI 30-34.9   3. Acute reaction to situational stress   4. Noncompliance with treatment regimen     Hypertension - Blood pressure still elevated-more so than prior even, no red flag symptoms or symptoms at all - Noncompliance with medications is a issue- not taking as prescribed - Patient will take all meds as prescribed - DASH diet; weight loss; exercise - Home BP monitoring-keep a log and write it down. Bring in next office visit.  Obesity, Class I, BMI 30-34.9 Patient will consider Weight Watchers - I highly recommend it -Patient states that when her weight comes down all of her values improved. - Dietary modifications discussed with patient- says she knows what to do but just difficult to do. - Declines nutrition referral  Acute reaction to situational stress Encouraged patient to go to a counselor for support - Patient declines medications. - Follow up if she changes her mind, or emotionally she worsens  Noncompliance with treatment regimen Risks associated with noncompliant behavior with my treatment regimen or treatment plan reviewed with patient.   Pt denies mood disorder, denies significant barriers of education, money etc.  Explained many significant risks associated with these disease processes that if they remain poorly controlled, can even lead to death.    New Prescriptions   No medications on file    Modified Medications   No medications on file    Discontinued Medications   No medications on file    The patient was counseled, risk factors were discussed, anticipatory guidance given.  Gross side effects, risk and benefits, and alternatives of medications and treatment plan in general discussed with patient.  Patient is aware that all medications have potential side effects and we are unable to predict every side effect or drug-drug interaction that may occur.    Patient will call with any questions prior to using medication if they have concerns.  Expresses verbal understanding and consents to current therapy and treatment regimen.  No barriers to understanding were identified.  Red flag symptoms and signs discussed in detail.  Patient expressed understanding regarding what to do in case of emergency\urgent symptoms  Return in about 4 months (around 02/22/2017), or Patient will follow up sooner than planned if remains poorly controlled:  , for Hypertension follow up, weight loss follow up.  Please see AVS handed out to patient at the end of our visit for further patient instructions/ counseling done pertaining to today's office visit.    Note: This document was prepared using Dragon voice recognition software and may include unintentional dictation errors.   --------------------------------------------------------------------------------------------------------------------------------------------------------------------------------------------------------------------------------------------    Subjective:    CC:  Chief Complaint  Patient presents with  . Hypertension    HPI: Robin Greene is a 61 y.o. female who presents to [place] at [facility] today for follow-up of her elevated blood pressure, .  1) HTN: - Patient reports poor compliance with treatment regimen- has not been taking her medicines as prescribed. Has missed several days of hydrochlorothiazide and occasionally misses her lisinopril. - Denies medication S-E  - She denies new onset of: chest pain, exercise intolerance, shortness of breath, dizziness, visual changes, headache, lower extremity swelling or claudication.  - Patient does not want to change her medicines today Today their BP is BP: (!) 177/89   Last 3 blood pressure readings in our office are as follows: BP Readings from  Last 3 Encounters:  10/25/16 (!) 177/89  10/18/16 (!) 165/83  09/27/16 129/83      2) Obesity: - BMI above 30 - Patient admits to very poor diet since the holidays began back in November - No exercise.  Wt Readings from Last 3 Encounters:  10/25/16 194 lb (88 kg)  10/18/16 196 lb 8 oz (89.1 kg)  09/27/16 191 lb 11.2 oz (87 kg)    3) Emotional distress: - Patient has had difficult time dealing with her husband and her husband losing his father this Christmas, also patient lost her own aunt.  Not sleeping that great.  Has depressed mood but denies any suicidal homicidal ideations. Denies any major interference with her life. She does not have a counselor and does not want to get one. She declines need for medicines.    Pulse Readings from Last 3 Encounters:  10/25/16 82  10/18/16 85  09/27/16 79   BMI Readings from Last 3 Encounters:  10/25/16 31.79 kg/m  10/18/16 32.20 kg/m  09/27/16 31.42 kg/m     Patient Care Team    Relationship Specialty Notifications Start End  Mellody Dance, DO PCP - General Family Medicine  09/27/16   Joretta Bachelor, [facility] Care Management Pharmacist  01/15/15   Ronald Lobo, MD Consulting Physician Gastroenterology  09/27/16   Delrae Rend, MD Consulting Physician Endocrinology  09/27/16   Druscilla Brownie, MD Consulting Physician Dermatology  09/27/16   Arvella Nigh, MD Consulting Physician Obstetrics and Gynecology  09/27/16   Vevelyn Royals, MD Consulting Physician Ophthalmology  09/27/16     Patient Active Problem List   Diagnosis Date Noted  . Hypertension 09/27/2016    Priority: High  . Mixed hyperlipidemia 09/27/2016    Priority: High  . Diabetes mellitus ([place]) 02/07/2013    Priority: High  . Hypothyroidism 09/27/2016    Priority: Medium  . Obesity, Class I, BMI 30-34.9 09/27/2016    Priority: Medium  . Vitamin D deficiency 09/27/2016    Priority: Low  . HSV-1 infection 09/27/2016    Priority: Low  . Anemia 09/27/2016    Priority: Low  . Environmental and seasonal allergies  09/27/2016    Priority: Low  . Acute reaction to situational stress 10/30/2016  . Noncompliance with treatment regimen 10/18/2016    Past Medical history, Surgical history, Family history, Social history, Allergies and Medications have been entered into the medical record, reviewed and changed as needed.   Allergies:  Allergies  Allergen Reactions  . Asa [Aspirin] Anaphylaxis  . Claritin [Loratadine] Other (See Comments)    Fainted twice   . Crestor [Rosuvastatin Calcium] Other (See Comments)    Abnormal LFTs  . Invokana [Canagliflozin] Other (See Comments)    Genital yeast infections  . Jardiance [Empagliflozin] Other (See Comments)    Genital yeast infection  . Lipitor [Atorvastatin] Other (See Comments)    Muscle aches   . Simvastatin Other (See Comments)    Muscle aches    Review of Systems  Constitutional: Negative for diaphoresis and weight loss.  HENT: Negative for nosebleeds.   Eyes: Negative for blurred vision and double vision.  Respiratory: Negative for shortness of breath and wheezing.   Cardiovascular: Negative for chest pain, palpitations, orthopnea and claudication.  Gastrointestinal: Negative for diarrhea, nausea and vomiting.  Musculoskeletal: Negative for falls and myalgias.  Skin: Negative for rash.  Neurological: Negative for dizziness and focal weakness.  Endo/Heme/Allergies: Negative for polydipsia.  Psychiatric/Behavioral: Negative for memory loss.  Objective:   Blood pressure (!) 177/89, pulse 82, resp. rate 18, weight 194 lb (88 kg), SpO2 96 %. Body mass index is 31.79 kg/m. General: Well Developed, well nourished, appropriate for stated age.  Neuro: Alert and oriented x3, extra-ocular muscles intact, sensation grossly intact.  HEENT: Normocephalic, atraumatic, neck supple, no carotid bruits appreciated  Skin: no gross rash. Cardiac: RRR, S1 S2 Respiratory: ECTA B/L, Not using accessory muscles, speaking in full  sentences-unlabored. Vascular:  Ext warm, dry, pink; cap RF less 2 sec. Psych: No HI/SI, judgement and insight good, Euthymic mood. Full Affect.

## 2016-10-27 ENCOUNTER — Encounter: Payer: Self-pay | Admitting: Family Medicine

## 2016-10-30 DIAGNOSIS — F430 Acute stress reaction: Secondary | ICD-10-CM | POA: Insufficient documentation

## 2016-10-30 NOTE — Assessment & Plan Note (Signed)
Prescription for Diflucan sent for patient per her request since she always gets vaginal yeast infections with antibiotic use.

## 2016-10-30 NOTE — Assessment & Plan Note (Signed)
-   Use Augmentin if no improvement or worse over the next 3-5 days

## 2016-10-30 NOTE — Assessment & Plan Note (Signed)
We'll check vitamin D levels with future labs.

## 2016-10-30 NOTE — Assessment & Plan Note (Signed)
Patient understands blood pressure not at goal- likely secondary to over-the-counter decongestants-Sudafed she's been taking. - Goal blood pressure less than 130/80 - Advised to monitor at home and if still remains above goal once no longer ill, return to clinic sooner than planned - Recommend Coricidin HBP if she feels necessary - No red flag symptoms.

## 2016-10-30 NOTE — Assessment & Plan Note (Addendum)
Weight loss advised.  Discussed prudent diet and exercise  Handouts given

## 2016-10-30 NOTE — Assessment & Plan Note (Signed)
We'll check TSH near future. - Managed by Dr. Buddy Duty of endocrinology along with her diabetes.

## 2016-10-30 NOTE — Assessment & Plan Note (Addendum)
Patient will consider Weight Watchers - I highly recommend it -Patient states that when her weight comes down all of her values improved. - Dietary modifications discussed with patient- says she knows what to do but just difficult to do. - Declines nutrition referral

## 2016-10-30 NOTE — Assessment & Plan Note (Addendum)
-   Blood pressure still elevated-more so than prior even, no red flag symptoms or symptoms at all - Noncompliance with medications is a issue- not taking as prescribed - Patient will take all meds as prescribed - DASH diet; weight loss; exercise - Home BP monitoring-keep a log and write it down. Bring in next office visit.

## 2016-10-30 NOTE — Assessment & Plan Note (Addendum)
Albuterol when necessary-2 puffs as needed for shortness of breath or wheeze  Prednisone taper  Supportive care discussed with patient.  If develops any red flag symptoms which we discussed, call us or to urgent care as she will need a chest x-ray and possibly other testing.

## 2016-10-30 NOTE — Assessment & Plan Note (Signed)
Risks associated with noncompliant behavior with my treatment regimen or treatment plan reviewed with patient.   Pt denies mood disorder, denies significant barriers of education, money etc.  Explained many significant risks associated with these disease processes that if they remain poorly controlled, can even lead to death.

## 2016-10-30 NOTE — Assessment & Plan Note (Addendum)
-   Supportive care with over-the-counter medicines in addition to prescribed ones discussed with patient.  - Precautions with Tussionex discussed with patient.

## 2016-10-30 NOTE — Assessment & Plan Note (Signed)
Patient going through a difficult personal time right now with recent deaths in her extended family.  - She declines any medications to help her through this emotional distress at this time.  Reminded patient of importance of compliance with her chronic medications.

## 2016-10-30 NOTE — Assessment & Plan Note (Signed)
Encouraged patient to go to a counselor for support - Patient declines medications. - Follow up if she changes her mind, or emotionally she worsens

## 2016-10-30 NOTE — Assessment & Plan Note (Signed)
Albuterol when necessary-2 puffs as needed for shortness of breath or wheeze  Prednisone taper  Supportive care discussed with patient.  If develops any red flag symptoms which we discussed, call us or to urgent care.

## 2016-11-03 MED FILL — LISINOPRIL 40 MG TABLET: 40 | 60 days supply | Qty: 60 | Fill #4

## 2016-11-03 MED FILL — JARDIANCE 25 MG TABLET: 25 | 90 days supply | Qty: 90 | Fill #3

## 2016-11-03 MED FILL — SYNTHROID 137 MCG TABLET: 137 | 30 days supply | Qty: 30 | Fill #1

## 2016-11-17 ENCOUNTER — Ambulatory Visit: Payer: Self-pay | Admitting: Pharmacist

## 2016-11-24 DIAGNOSIS — E039 Hypothyroidism, unspecified: Secondary | ICD-10-CM | POA: Diagnosis not present

## 2016-11-29 ENCOUNTER — Other Ambulatory Visit: Payer: Self-pay | Admitting: Adult Health

## 2016-11-29 ENCOUNTER — Telehealth: Payer: Self-pay | Admitting: Family Medicine

## 2016-11-29 MED ORDER — AZITHROMYCIN 250 MG PO TABS: ORAL_TABLET | ORAL | 0 refills | Status: DC

## 2016-11-29 MED FILL — AZITHROMYCIN 250 MG TABLET: 250 | 5 days supply | Qty: 6 | Fill #0

## 2016-11-29 NOTE — Telephone Encounter (Signed)
Pt states that she was in the attic 5 days and has now developed a "bad cough" with white/yellow sputum production, is having some shortness of breath, and white/yellow nasal discharge.  Pt denies fever, chills, ear pain.  Pt states that she just remembered that she has an inhaler that she will start on.  She has tried OTC Mucinex.  Pt would like an RX for an antibiotic called in.  Please advise.

## 2016-11-29 NOTE — Telephone Encounter (Signed)
Patient saw Dr. Jenetta Downer last month for bronchitis and still is suffering from some symptoms (bad cough, raspiness) and wants to speak to someone clinical about what she should do, or if she can have something called in for it

## 2016-11-29 NOTE — Telephone Encounter (Signed)
Afternoon Kenney Houseman, I see where Ms. Grissett was seen/treated 10/18/2016- 6 weeks ago and given Augmentin, Prednisone, and ProAir. I would advise her to increase fluids/rest/vit c and continue with ProAir as directed. Will send Azithromycin Rx, take as directed. If sx's persist after ABX completed, then she needs to be seen in clinic. Thanks! Valetta Fuller

## 2016-11-29 NOTE — Progress Notes (Signed)
ABX called in for acute bronchitis. Continue Albuterol. Increase water/rest/vit c. If sx's persist after ABX then come into clinic to be seen.

## 2016-11-29 NOTE — Telephone Encounter (Signed)
Pt informed.  Pt expressed understanding and is agreeable.  T. Nelson, CMA  

## 2016-12-04 MED FILL — HYDROCHLOROTHIAZIDE 25 MG T: 25 | 30 days supply | Qty: 30 | Fill #0

## 2016-12-04 MED FILL — SYNTHROID 137 MCG TABLET: 137 | 30 days supply | Qty: 30 | Fill #2

## 2016-12-04 MED FILL — TERCONAZOLE 0.4% VAG CREAM: 0.4 | 7 days supply | Qty: 45 | Fill #3

## 2016-12-04 NOTE — Telephone Encounter (Signed)
Robin Greene called back and was concernered of heart racing after taking the Proventil. I did advise patient that this is a come side effect to this medication but should resolve within minutes. She states she is still sick with productive cough with white to clear sputum, chest tightness, wheezing, post nasal drip and some shortness of breath. She is slightly better. She has increased her fluids and started vitamin C. She is at work today. She had fever, chills and sweats which resolved on Sunday. There are open appointments on Wednesday. Please advise.

## 2016-12-04 NOTE — Telephone Encounter (Signed)
Afternoon Levada Dy, Please have her schedule an appt on Wed.  I agree to keep pushing fluids and Vit c. Thanks! Valetta Fuller

## 2016-12-04 NOTE — Telephone Encounter (Signed)
Patient advised and scheduled.  

## 2016-12-06 ENCOUNTER — Ambulatory Visit: Payer: 59 | Admitting: Adult Health

## 2016-12-29 MED FILL — SYNTHROID 137 MCG TABLET: 137 | 90 days supply | Qty: 90 | Fill #0

## 2016-12-29 MED FILL — LANTUS SOLOSTAR 100 UNITS/M: 100 | 87 days supply | Qty: 45 | Fill #0

## 2017-01-02 DIAGNOSIS — J342 Deviated nasal septum: Secondary | ICD-10-CM | POA: Diagnosis not present

## 2017-01-02 DIAGNOSIS — J343 Hypertrophy of nasal turbinates: Secondary | ICD-10-CM | POA: Diagnosis not present

## 2017-01-02 DIAGNOSIS — J310 Chronic rhinitis: Secondary | ICD-10-CM | POA: Diagnosis not present

## 2017-01-02 MED FILL — FLUTICASONE PROP 50 MCG SPR: 50 | 30 days supply | Qty: 16 | Fill #0

## 2017-01-02 MED FILL — predniSONE 10 MG TABS: 10 | 6 days supply | Qty: 21 | Fill #0

## 2017-01-03 ENCOUNTER — Telehealth: Payer: Self-pay | Admitting: Family Medicine

## 2017-01-03 MED FILL — HYDROCHLOROTHIAZIDE 25 MG T: 25 | 90 days supply | Qty: 90 | Fill #1

## 2017-01-03 MED FILL — LISINOPRIL 40 MG TABLET: 40 | 90 days supply | Qty: 90 | Fill #0

## 2017-01-03 NOTE — Telephone Encounter (Signed)
Called pt to inform her that we could not refill her prescription for lisinopril until she came in for an office visit. Dr. Raliegh Scarlet has not previously prescribed the medication before. Pt stated she was going to try and get her previous Doctor to refill it for her.

## 2017-01-03 NOTE — Telephone Encounter (Signed)
Pt called to request BP medication refill-----Lisinopril/ Prinivil, Zestril 40 MG tablets (taken daily)-- uses [facility] pharmacy/ she is a Furniture conservator/restorer. --glh

## 2017-01-04 ENCOUNTER — Encounter (INDEPENDENT_AMBULATORY_CARE_PROVIDER_SITE_OTHER): Payer: Self-pay | Admitting: Family Medicine

## 2017-01-08 DIAGNOSIS — H2513 Age-related nuclear cataract, bilateral: Secondary | ICD-10-CM | POA: Diagnosis not present

## 2017-01-08 DIAGNOSIS — B300 Keratoconjunctivitis due to adenovirus: Secondary | ICD-10-CM | POA: Diagnosis not present

## 2017-01-10 MED FILL — metFORMIN HCL 1000 MG TABS: 1000 | 90 days supply | Qty: 180 | Fill #0

## 2017-01-10 MED FILL — LIOTHYRONINE SODIUM 5 MCG T: 5 | 30 days supply | Qty: 60 | Fill #0

## 2017-01-23 DIAGNOSIS — Z794 Long term (current) use of insulin: Secondary | ICD-10-CM | POA: Diagnosis not present

## 2017-01-23 DIAGNOSIS — E039 Hypothyroidism, unspecified: Secondary | ICD-10-CM | POA: Diagnosis not present

## 2017-01-23 DIAGNOSIS — E669 Obesity, unspecified: Secondary | ICD-10-CM | POA: Diagnosis not present

## 2017-01-23 DIAGNOSIS — Z92241 Personal history of systemic steroid therapy: Secondary | ICD-10-CM | POA: Diagnosis not present

## 2017-01-23 DIAGNOSIS — Z5181 Encounter for therapeutic drug level monitoring: Secondary | ICD-10-CM | POA: Diagnosis not present

## 2017-01-23 DIAGNOSIS — E1165 Type 2 diabetes mellitus with hyperglycemia: Secondary | ICD-10-CM | POA: Diagnosis not present

## 2017-01-23 DIAGNOSIS — Z6831 Body mass index (BMI) 31.0-31.9, adult: Secondary | ICD-10-CM | POA: Diagnosis not present

## 2017-01-23 MED FILL — VICTOZA 18 MG/3 ML INJECT P: 18 | 90 days supply | Qty: 27 | Fill #0

## 2017-01-30 DIAGNOSIS — J310 Chronic rhinitis: Secondary | ICD-10-CM | POA: Diagnosis not present

## 2017-01-30 DIAGNOSIS — J343 Hypertrophy of nasal turbinates: Secondary | ICD-10-CM | POA: Diagnosis not present

## 2017-01-31 MED FILL — FLUTICASONE PROP 50 MCG SPR: 50 | 30 days supply | Qty: 16 | Fill #1

## 2017-01-31 MED FILL — IPRATROPIUM 0.06% SPRAY: 0.06 | 30 days supply | Qty: 15 | Fill #0

## 2017-02-01 ENCOUNTER — Encounter (INDEPENDENT_AMBULATORY_CARE_PROVIDER_SITE_OTHER): Payer: Self-pay | Admitting: Family Medicine

## 2017-02-15 MED FILL — LIOTHYRONINE SODIUM 5 MCG T: 5 | 30 days supply | Qty: 60 | Fill #0

## 2017-03-06 MED FILL — FLUTICASONE PROP 50 MCG SPR: 50 | 30 days supply | Qty: 16 | Fill #2

## 2017-03-06 MED FILL — UNIFINE PENTIPS 32GX5/32": 32G X 4 MM | 30 days supply | Qty: 200 | Fill #2

## 2017-03-06 MED FILL — TERCONAZOLE 0.4% VAG CREAM: 0.4 | 7 days supply | Qty: 45 | Fill #4

## 2017-03-06 MED FILL — UNIFINE PENTIPS 32GX5/32: 32G X 4 MM | 30 days supply | Qty: 200 | Fill #2

## 2017-03-07 DIAGNOSIS — Z6831 Body mass index (BMI) 31.0-31.9, adult: Secondary | ICD-10-CM | POA: Diagnosis not present

## 2017-03-07 DIAGNOSIS — Z92241 Personal history of systemic steroid therapy: Secondary | ICD-10-CM | POA: Diagnosis not present

## 2017-03-07 DIAGNOSIS — Z5181 Encounter for therapeutic drug level monitoring: Secondary | ICD-10-CM | POA: Diagnosis not present

## 2017-03-07 DIAGNOSIS — E669 Obesity, unspecified: Secondary | ICD-10-CM | POA: Diagnosis not present

## 2017-03-07 DIAGNOSIS — Z794 Long term (current) use of insulin: Secondary | ICD-10-CM | POA: Diagnosis not present

## 2017-03-07 DIAGNOSIS — E039 Hypothyroidism, unspecified: Secondary | ICD-10-CM | POA: Diagnosis not present

## 2017-03-07 DIAGNOSIS — E119 Type 2 diabetes mellitus without complications: Secondary | ICD-10-CM | POA: Diagnosis not present

## 2017-03-13 ENCOUNTER — Encounter: Payer: Self-pay | Admitting: Family Medicine

## 2017-03-13 ENCOUNTER — Ambulatory Visit (INDEPENDENT_AMBULATORY_CARE_PROVIDER_SITE_OTHER): Payer: 59 | Admitting: Family Medicine

## 2017-03-13 VITALS — BP 145/78 | HR 93 | Ht 65.5 in | Wt 183.0 lb

## 2017-03-13 DIAGNOSIS — I10 Essential (primary) hypertension: Secondary | ICD-10-CM | POA: Diagnosis not present

## 2017-03-13 DIAGNOSIS — K429 Umbilical hernia without obstruction or gangrene: Secondary | ICD-10-CM | POA: Diagnosis not present

## 2017-03-13 DIAGNOSIS — E119 Type 2 diabetes mellitus without complications: Secondary | ICD-10-CM

## 2017-03-13 DIAGNOSIS — E782 Mixed hyperlipidemia: Secondary | ICD-10-CM | POA: Diagnosis not present

## 2017-03-13 DIAGNOSIS — E669 Obesity, unspecified: Secondary | ICD-10-CM

## 2017-03-13 DIAGNOSIS — E039 Hypothyroidism, unspecified: Secondary | ICD-10-CM | POA: Diagnosis not present

## 2017-03-13 LAB — POCT GLYCOSYLATED HEMOGLOBIN (HGB A1C): Hemoglobin A1C: 7.9

## 2017-03-13 NOTE — Assessment & Plan Note (Signed)
Doing great-  Lost  13 lbs since beginning of the yr.  Great job- cont wt loss  D/c pt 131min/ wk and Lose It app.  Pays for [employer] but doesn't use it/ or go to meetings

## 2017-03-13 NOTE — Assessment & Plan Note (Addendum)
A1c 7.4 today.  - improved from prior which was 10 per pt 3 mo ago at [facility] Endo- Dr Buddy Duty.   Restarted victoza per Dr Kerr---> ENDO [facility]  Med mgt per them  - Encouraged prudent diet and exercise.  Wt loss discussed

## 2017-03-13 NOTE — Progress Notes (Signed)
Pt here for an acute care OV today   Impression and Recommendations:    1. Umbilical hernia without obstruction and without gangrene   2. Type 2 diabetes mellitus without complication, unspecified whether long term insulin use ([place])   3. Essential hypertension   4. Obesity, Class I, BMI 30-34.9   5. Mixed hyperlipidemia   6. Hypothyroidism, unspecified type      Diabetes mellitus ([place]) A1c 7.4 today.  - improved from prior which was 10 per pt 3 mo ago at [facility] Endo- Dr Buddy Duty.   Restarted victoza per Dr Kerr---> ENDO [facility].  Med mgt per them  - Encouraged prudent diet and exercise.  Wt loss discussed  Hypertension Asked patient to please check blood pressures at home and keep a log.  Recheck blood pressure today- little imrpved --> please cont wt loss, low salt/ dash diet and monitor at home  - cont meds for now--> but notified pt not at goal and she wishes to not make any changes today    Mixed hyperlipidemia On Livalo.   We'll monitor fasting lipid profile and CMP yearly.    Hypothyroidism Treatment per Endo. Dr Buddy Duty  Obesity, Class I, BMI 30-34.9 Doing great-  Lost  13 lbs since beginning of the yr.  Great job- cont wt loss  D/c pt 170min/ wk and Lose It app.  Pays for [employer] but doesn't use it/ or go to meetings    Umbilical hernia without obstruction and without gangrene Referral to Dr. Hassell Done per patient request for gen sx  Signs and symptoms of incarcerated hernia discussed with patient.  If she develops any of these this is a surgical emergency - pt understands.    The patient was counseled, risk factors were discussed, anticipatory guidance given.    Meds ordered this encounter  Medications  . liraglutide (VICTOZA) 18 MG/3ML SOPN    Sig: Inject 1.8 mg into the skin.     Discontinued Medications   AMOXICILLIN-CLAVULANATE (AUGMENTIN) 875-125 MG TABLET    Take 1 tablet by mouth 2 (two) times daily.   PREDNISONE (DELTASONE) 20 MG TABLET    Take  3 tabs po * 2 days, then 2 tabs for 2 d, then 1 tab 2 d, then 1/2 tab 2 days.      Orders Placed This Encounter  Procedures  . Ambulatory referral to General Surgery  . POCT glycosylated hemoglobin (Hb A1C)     Gross side effects, risk and benefits, and alternatives of medications and treatment plan in general discussed with patient.  Patient is aware that all medications have potential side effects and we are unable to predict every side effect or drug-drug interaction that may occur.   Patient will call with any questions prior to using medication if they have concerns.  Expresses verbal understanding and consents to current therapy and treatment regimen.  No barriers to understanding were identified.  Red flag symptoms and signs discussed in detail.  Patient expressed understanding regarding what to do in case of emergency\urgent symptoms  Please see AVS handed out to patient at the end of our visit for further patient instructions/ counseling done pertaining to today's office visit.   Return in about 4 months (around 07/14/2017).     Note: This document was prepared using Dragon voice recognition software and may include unintentional dictation errors.  Robin Greene 1:04 PM --------------------------------------------------------------------------------------------------------------------------------------------------------------------------------------------------------------------------------------------    Subjective:    CC:  Chief Complaint  Patient presents with  . Inguinal Hernia  HPI: Robin Greene is a 61 y.o. female who presents to [place] at [facility] today for issues as discussed below.   Dr Buddy Duty office--> Bp  112/ 81 recently.    A1c- 10.0 3 mo ago at Endo doc's office.  Started on victoza 3 mo ago and doing well.    FBS 80's- 160 or so.   No highs/ lows and feeling good.    Hasn't had repeat A1c yet.    Lost 13 pounds in past sev  months- eating smaller protions and better choices. - just started moving more.    Prefers to see dr Hassell Done - gen sx. And complaints of abdominal\periumbilical hernia.  This came on a couple years ago after an episode where she fell into a fence.  The symptoms got much worse with lifting heavy Christmas boxes up and down from the attic this holiday season.    -Symptoms have been good  since but it does protrude and hurts to push it back in at times.  No new GI sx at all.      Wt Readings from Last 3 Encounters:  03/13/17 183 lb (83 kg)  10/25/16 194 lb (88 kg)  10/18/16 196 lb 8 oz (89.1 kg)   BP Readings from Last 3 Encounters:  03/13/17 (!) 145/78  10/25/16 (!) 177/89  10/18/16 (!) 165/83   BMI Readings from Last 3 Encounters:  03/13/17 29.99 kg/m  10/25/16 31.79 kg/m  10/18/16 32.20 kg/m     Patient Care Team    Relationship Specialty Notifications Start End  Mellody Dance, DO PCP - General Family Medicine  09/27/16   Joretta Bachelor, [facility] Care Management Pharmacist  01/15/15   Ronald Lobo, MD Consulting Physician Gastroenterology  09/27/16   Delrae Rend, MD Consulting Physician Endocrinology  09/27/16   Druscilla Brownie, MD Consulting Physician Dermatology  09/27/16   Arvella Nigh, MD Consulting Physician Obstetrics and Gynecology  09/27/16   Vevelyn Royals, MD Consulting Physician Ophthalmology  09/27/16      Patient Active Problem List   Diagnosis Date Noted  . Hypertension 09/27/2016    Priority: High  . Mixed hyperlipidemia 09/27/2016    Priority: High  . Diabetes mellitus ([place]) 02/07/2013    Priority: High  . Hypothyroidism 09/27/2016    Priority: Medium  . Obesity, Class I, BMI 30-34.9 09/27/2016    Priority: Medium  . Vitamin D deficiency 09/27/2016    Priority: Low  . HSV-1 infection 09/27/2016    Priority: Low  . Anemia 09/27/2016    Priority: Low  . Environmental and seasonal allergies 09/27/2016     Priority: Low  . Umbilical hernia without obstruction and without gangrene 03/13/2017  . Acute reaction to situational stress 10/30/2016  . Noncompliance with treatment regimen 10/18/2016    Past Medical history, Surgical history, Family history, Social history, Allergies and Medications have been entered into the medical record, reviewed and changed as needed.    Current Meds  Medication Sig  . albuterol (PROVENTIL HFA;VENTOLIN HFA) 108 (90 Base) MCG/ACT inhaler Inhale 2 puffs into the lungs every 6 (six) hours as needed for wheezing.  Marland Kitchen azithromycin (ZITHROMAX) 250 MG tablet 2 tablets by mouth day 1, then 1 tablet by mouth days 2-5.  Marland Kitchen cetirizine (ZYRTEC) 10 MG tablet Take 10 mg by mouth daily.  . chlorpheniramine-HYDROcodone (TUSSIONEX) 10-8 MG/5ML SUER Take 5 mLs by mouth every 12 (twelve) hours as needed for cough (cough, will cause drowsiness.).  Marland Kitchen  ferrous sulfate 325 (65 FE) MG tablet Take 325 mg by mouth every other day.  Marland Kitchen glucose blood test strip Inject 1 each into the skin as needed. Use as instructed  . hydrochlorothiazide (HYDRODIURIL) 25 MG tablet Take 1 tablet by mouth daily.  . insulin glargine (LANTUS) 100 UNIT/ML injection Inject 42 Units into the skin at bedtime.   Marland Kitchen liothyronine (CYTOMEL) 5 MCG tablet Take 5 mcg by mouth 2 (two) times daily.  Marland Kitchen liraglutide (VICTOZA) 18 MG/3ML SOPN Inject 1.8 mg into the skin.  Marland Kitchen lisinopril (PRINIVIL,ZESTRIL) 40 MG tablet Take 40 mg by mouth daily.  . metFORMIN (GLUCOPHAGE) 1000 MG tablet Take 1,000 mg by mouth 2 (two) times daily with a meal.  . metroNIDAZOLE (METROGEL) 1 % gel Apply 1 application topically daily as needed.   . Omega-3 Fatty Acids (FISH OIL) 1000 MG CAPS Take 1,000 mg by mouth 2 (two) times daily.  . Pitavastatin Calcium (LIVALO) 1 MG TABS Take 1 tablet by mouth daily.   . Probiotic Product (PROBIOTIC DAILY) CAPS Take 1 tablet by mouth daily.  Marland Kitchen SYNTHROID 137 MCG tablet Take 1 tablet by mouth daily.  . valACYclovir  (VALTREX) 1000 MG tablet Take 500 mg by mouth 2 (two) times daily. 1/2 tablet twice daily for 5 days as needed for flares  . Vitamin D, Cholecalciferol, 1000 units CAPS Take 2,000 capsules by mouth daily.  . [DISCONTINUED] amoxicillin-clavulanate (AUGMENTIN) 875-125 MG tablet Take 1 tablet by mouth 2 (two) times daily.  . [DISCONTINUED] predniSONE (DELTASONE) 20 MG tablet Take 3 tabs po * 2 days, then 2 tabs for 2 d, then 1 tab 2 d, then 1/2 tab 2 days.    Allergies:  Allergies  Allergen Reactions  . Asa [Aspirin] Anaphylaxis  . Claritin [Loratadine] Other (See Comments)    Fainted twice   . Crestor [Rosuvastatin Calcium] Other (See Comments)    Abnormal LFTs  . Invokana [Canagliflozin] Other (See Comments)    Genital yeast infections  . Jardiance [Empagliflozin] Other (See Comments)    Genital yeast infection  . Lipitor [Atorvastatin] Other (See Comments)    Muscle aches   . Simvastatin Other (See Comments)    Muscle aches     Review of Systems: General:   Denies fever, chills, unexplained weight loss.  Optho/Auditory:   Denies visual changes, blurred vision/LOV Respiratory:   Denies wheeze, DOE more than baseline levels.  Cardiovascular:   Denies chest pain, palpitations, new onset peripheral edema  Gastrointestinal:   Denies nausea, vomiting, diarrhea, abd pain.  Genitourinary: Denies dysuria, freq/ urgency, flank pain or discharge from genitals.  Endocrine:     Denies hot or cold intolerance, polyuria, polydipsia. Musculoskeletal:   Denies unexplained myalgias, joint swelling, unexplained arthralgias, gait problems.  Skin:  Denies new onset rash, suspicious lesions Neurological:     Denies dizziness, unexplained weakness, numbness  Psychiatric/Behavioral:   Denies mood changes, suicidal or homicidal ideations, hallucinations    Objective:   Blood pressure (!) 145/78, pulse 93, height 5' 5.5" (1.664 m), weight 183 lb (83 kg), SpO2 98 %. Body mass index is 29.99  kg/m. General:  Well Developed, well nourished, appropriate for stated age.  Neuro:  Alert and oriented,  extra-ocular muscles intact  HEENT:  Normocephalic, atraumatic, neck supple Skin:  no gross rash, warm, pink. Cardiac:  RRR, S1 S2 Respiratory:  ECTA B/L and A/P, Not using accessory muscles, speaking in full sentences- unlabored. ABD;  Small periumbilical reducible hernia approx 2cm diam superior aspect umbilicus,  No G/R/R,  BS * 4 Vascular:  Ext warm, no cyanosis apprec.; cap RF less 2 sec. Psych:  No HI/SI, judgement and insight good, Euthymic mood. Full Affect.

## 2017-03-13 NOTE — Assessment & Plan Note (Addendum)
Asked patient to please check blood pressures at home and keep a log.  Recheck blood pressure today- little imrpved --> please cont wt loss, low salt/ dash diet and monitor at home  - cont meds for now--> but notified pt not at goal and she wishes to not make any changes today

## 2017-03-13 NOTE — Assessment & Plan Note (Signed)
On Livalo.   We'll monitor fasting lipid profile and CMP yearly.

## 2017-03-13 NOTE — Assessment & Plan Note (Signed)
Referral to Dr. Hassell Done per patient request for gen sx  Signs and symptoms of incarcerated hernia discussed with patient.  If she develops any of these this is a surgical emergency - pt understands.

## 2017-03-13 NOTE — Patient Instructions (Addendum)
I put in referral to Dr. Hassell Done, please call the office and speak with Dorothea Ogle if you do not hear from them over the next 1-2 weeks.     Please realize, EXERCISE IS MEDICINE!  -  American Heart Association [facility]) guidelines for exercise : If you are in good health, without any medical conditions, you should engage in 150 minutes of moderate intensity aerobic activity per week.  This means you should be huffing and puffing throughout your workout.   Engaging in regular exercise will improve brain function and memory, as well as improve mood, boost immune system and help with weight management.  As well as the other, more well-known effects of exercise such as decreasing blood sugar levels, decreasing blood pressure,  and decreasing bad cholesterol levels/ increasing good cholesterol levels.     -  The AHA strongly endorses consumption of a diet that contains a variety of foods from all the food categories with an emphasis on fruits and vegetables; fat-free and low-fat dairy products; cereal and grain products; legumes and nuts; and fish, poultry, and/or extra lean meats.    Excessive food intake, especially of foods high in saturated and trans fats, sugar, and salt, should be avoided.    Adequate water intake of roughly 1/2 of your weight in pounds, should equal the ounces of water per day you should drink.  So for instance, if you're 200 pounds, that would be 100 ounces of water per day.    Guidelines for Losing Weight   We want weight loss that will last so you should lose 1-2 pounds a week.  THAT IS IT! Please pick THREE things a month to change. Once it is a habit check off the item. Then pick another three items off the list to become habits.  If you are already doing a habit on the list GREAT!  Cross that item off!  Don't drink your calories. Ie, alcohol, soda, fruit juice, and sweet tea.   Drink more water. Drink a glass when you feel hungry or before each meal.   Eat breakfast - Complex  carb and protein (likeDannon light and fit yogurt, oatmeal, fruit, eggs, [place] bacon).  Measure your cereal.  Eat no more than one cup a day. (ie Kashi)  Eat an apple a day.  Add a vegetable a day.  Try a new vegetable a month.  Use Pam! Stop using oil or butter to cook.  Don't finish your plate or use smaller plates.  Share your dessert.  Eat sugar free Jello for dessert or frozen grapes.  Don't eat 2-3 hours before bed.  Switch to whole wheat bread, pasta, and brown rice.  Make healthier choices when you eat out. No fries!  Pick baked chicken, NOT fried.  Don't forget to SLOW DOWN when you eat. It is not going anywhere.   Take the stairs.  Park far away in the parking lot  Lift soup cans (or weights) for 10 minutes while watching TV.  Walk at work for 10 minutes during break.  Walk outside 1 time a week with your friend, kids, dog, or significant other.  Start a walking group at church.  Walk the mall as much as you can tolerate.   Keep a food diary.  Weigh yourself daily.  Walk for 15 minutes 3 days per week.  Cook at home more often and eat out less. If life happens and you go back to old habits, it is okay.  Just start over.  You can do it!  If you experience chest pain, get short of breath, or tired during the exercise, please stop immediately and inform your doctor.    Before you even begin to attack a weight-loss plan, it pays to remember this: You are not fat. You have fat. Losing weight isn't about blame or shame; it's simply another achievement to accomplish. Dieting is like any other skill-you have to buckle down and work at it. As long as you act in a smart, reasonable way, you'll ultimately get where you want to be. Here are some weight loss pearls for you.   1. It's Not a Diet. It's a Lifestyle Thinking of a diet as something you're on and suffering through only for the short term doesn't work. To shed weight and keep it off, you need to make  permanent changes to the way you eat. It's OK to indulge occasionally, of course, but if you cut calories temporarily and then revert to your old way of eating, you'll gain back the weight quicker than you can say yo-yo. Use it to lose it. Research shows that one of the best predictors of long-term weight loss is how many pounds you drop in the first month. For that reason, nutritionists often suggest being stricter for the first two weeks of your new eating strategy to build momentum. Cut out added sugar and alcohol and avoid unrefined carbs. After that, figure out how you can reincorporate them in a way that's healthy and maintainable.  2. There's a Right Way to Exercise Working out burns calories and fat and boosts your metabolism by building muscle. But those trying to lose weight are notorious for overestimating the number of calories they burn and underestimating the amount they take in. Unfortunately, your system is biologically programmed to hold on to extra pounds and that means when you start exercising, your body senses the deficit and ramps up its hunger signals. If you're not diligent, you'll eat everything you burn and then some. Use it, to lose it. Cardio gets all the exercise glory, but strength and interval training are the real heroes. They help you build lean muscle, which in turn increases your metabolism and calorie-burning ability 3. Don't Overreact to Mild Hunger Some people have a hard time losing weight because of hunger anxiety. To them, being hungry is bad-something to be avoided at all costs-so they carry snacks with them and eat when they don't need to. Others eat because they're stressed out or bored. While you never want to get to the point of being ravenous (that's when bingeing is likely to happen), a hunger pang, a craving, or the fact that it's 3:00 p.m. should not send you racing for the vending machine or obsessing about the energy bar in your purse. Ideally, you should put  off eating until your stomach is growling and it's difficult to concentrate.  Use it to lose it. When you feel the urge to eat, use the HALT method. Ask yourself, Am I really hungry? Or am I angry or anxious, lonely or bored, or tired? If you're still not certain, try the apple test. If you're truly hungry, an apple should seem delicious; if it doesn't, something else is going on. Or you can try drinking water and making yourself busy, if you are still hungry try a healthy snack.  4. Not All Calories Are Created Equal The mechanics of weight loss are pretty simple: Take in fewer calories than you use for energy. But the  kind of food you eat makes all the difference. Processed food that's high in saturated fat and refined starch or sugar can cause inflammation that disrupts the hormone signals that tell your brain you're full. The result: You eat a lot more.  Use it to lose it. Clean up your diet. Swap in whole, unprocessed foods, including vegetables, lean protein, and healthy fats that will fill you up and give you the biggest nutritional bang for your calorie buck. In a few weeks, as your brain starts receiving regular hunger and fullness signals once again, you'll notice that you feel less hungry overall and naturally start cutting back on the amount you eat.  5. Protein, Produce, and Plant-Based Fats Are Your Weight-Loss Trinity Here's why eating the three Ps regularly will help you drop pounds. Protein fills you up. You need it to build lean muscle, which keeps your metabolism humming so that you can torch more fat. People in a weight-loss program who ate double the recommended daily allowance for protein (about 110 grams for a 150-pound woman) lost 70 percent of their weight from fat, while people who ate the RDA lost only about 40 percent, one study found. Produce is packed with filling fiber. "It's very difficult to consume too many calories if you're eating a lot of vegetables. Example: Three cups  of broccoli is a lot of food, yet only 93 calories. (Fruit is another story. It can be easy to overeat and can contain a lot of calories from sugar, so be sure to monitor your intake.) Plant-based fats like olive oil and those in avocados and nuts are healthy and extra satiating.  Use it to lose it. Aim to incorporate each of the three Ps into every meal and snack. People who eat protein throughout the day are able to keep weight off, according to a study in the [place] [employer]. In addition to meat, poultry and seafood, good sources are beans, lentils, eggs, tofu, and yogurt. As for fat, keep portion sizes in check by measuring out salad dressing, oil, and nut butters (shoot for one to two tablespoons). Finally, eat veggies or a little fruit at every meal. People who did that consumed 308 fewer calories but didn't feel any hungrier than when they didn't eat more produce.  7. How You Eat Is As Important As What You Eat In order for your brain to register that you're full, you need to focus on what you're eating. Sit down whenever you eat, preferably at a table. Turn off the TV or computer, put down your phone, and look at your food. Smell it. Chew slowly, and don't put another bite on your fork until you swallow. When women ate lunch this attentively, they consumed 30 percent less when snacking later than those who listened to an audiobook at lunchtime, according to a study in the [place] of Nutrition. 8. Weighing Yourself Really Works The scale provides the best evidence about whether your efforts are paying off. Seeing the numbers tick up or down or stagnate is motivation to keep going-or to rethink your approach. A 2015 study at [facility] found that daily weigh-ins helped people lose more weight, keep it off, and maintain that loss, even after two years. Use it to lose it. Step on the scale at the same time every day for the best results. If your weight shoots up  several pounds from one weigh-in to the next, don't freak out. Eating a lot of salt the night  before or having your period is the likely culprit. The number should return to normal in a day or two. It's a steady climb that you need to do something about. 9. Too Much Stress and Too Little Sleep Are Your Enemies When you're tired and frazzled, your body cranks up the production of cortisol, the stress hormone that can cause carb cravings. Not getting enough sleep also boosts your levels of ghrelin, a hormone associated with hunger, while suppressing leptin, a hormone that signals fullness and satiety. People on a diet who slept only five and a half hours a night for two weeks lost 55 percent less fat and were hungrier than those who slept eight and a half hours, according to a study in the [place]. Use it to lose it. Prioritize sleep, aiming for seven hours or more a night, which research shows helps lower stress. And make sure you're getting quality zzz's. If a snoring spouse or a fidgety cat wakes you up frequently throughout the night, you may end up getting the equivalent of just four hours of sleep, according to a study from [facility]. Keep pets out of the bedroom, and use a white-noise app to drown out snoring. 10. You Will Hit a plateau-And You Can Bust Through It As you slim down, your body releases much less leptin, the fullness hormone.  If you're not strength training, start right now. Building muscle can raise your metabolism to help you overcome a plateau. To keep your body challenged and burning calories, incorporate new moves and more intense intervals into your workouts or add another sweat session to your weekly routine. Alternatively, cut an extra 100 calories or so a day from your diet. Now that you've lost weight, your body simply doesn't need as much fuel.      Since food equals calories, in order to lose weight you must either eat fewer calories,  exercise more to burn off calories with activity, or both. Food that is not used to fuel the body is stored as fat. A major component of losing weight is to make smarter food choices. Here's how:  1)   Limit non-nutritious foods, such as: Sugar, honey, syrups and candy Pastries, donuts, pies, cakes and cookies Soft drinks, sweetened juices and alcoholic beverages  2)  Cut down on high-fat foods by: - Choosing poultry, fish or lean red meat - Choosing low-fat cooking methods, such as baking, broiling, steaming, grilling and boiling - Using low-fat or non-fat dairy products - Using vinaigrette, herbs, lemon or fat-free salad dressings - Avoiding fatty meats, such as bacon, sausage, franks, ribs and luncheon meats - Avoiding high-fat snacks like nuts, chips and chocolate - Avoiding fried foods - Using less butter, margarine, oil and mayonnaise - Avoiding high-fat gravies, cream sauces and cream-based soups  3) Eat a variety of foods, including: - Fruit and vegetables that are raw, steamed or baked - Whole grains, breads, cereal, rice and pasta - Dairy products, such as low-fat or non-fat milk or yogurt, low-fat cottage cheese and low-fat cheese - Protein-rich foods like chicken, [place], fish, lean meat and legumes, or beans  4) Change your eating habits by: - Eat three balanced meals a day to help control your hunger - Watch portion sizes and eat small servings of a variety of foods - Choose low-calorie snacks - Eat only when you are hungry and stop when you are satisfied - Eat slowly and try not to perform other tasks while eating -  Find other activities to distract you from food, such as walking, taking up a hobby or being involved in the community - Include regular exercise in your daily routine ( minimum of 20 min of moderate-intensity exercise at least 5 days/week)  - Find a support group, if necessary, for emotional support in your weight loss journey         Easy ways to  cut 100 calories   1. Eat your eggs with hot sauce OR salsa instead of cheese.  Eggs are great for breakfast, but many people consider eggs and cheese to be BFFs. Instead of cheese-1 oz. of cheddar has 114 calories-top your eggs with hot sauce, which contains no calories and helps with satiety and metabolism. Salsa is also a great option!!  2. Top your toast, waffles or pancakes with fresh berries instead of jelly or syrup. Half a cup of berries-fresh, frozen or thawed-has about 40 calories, compared with 2 tbsp. of maple syrup or jelly, which both have about 100 calories. The berries will also give you a good punch of fiber, which helps keep you full and satisfied and won't spike blood sugar quickly like the jelly or syrup. 3. Swap the non-fat latte for black coffee with a splash of half-and-half. Contrary to its name, that non-fat latte has 130 calories and a startling 19g of carbohydrates per 16 oz. serving. Replacing that 'light' drinkable dessert with a black coffee with a splash of half-and-half saves you more than 100 calories per 16 oz. serving. 4. Sprinkle salads with freeze-dried raspberries instead of dried cranberries. If you want a sweet addition to your nutritious salad, stay away from dried cranberries. They have a whopping 130 calories per  cup and 30g carbohydrates. Instead, sprinkle freeze-dried raspberries guilt-free and save more than 100 calories per  cup serving, adding 3g of belly-filling fiber. 5. Go for mustard in place of mayo on your sandwich. Mustard can add really nice flavor to any sandwich, and there are tons of varieties, from spicy to honey. A serving of mayo is 95 calories, versus 10 calories in a serving of mustard.  Or try an avocado mayo spread: You can find the recipe few click this link: https://www.californiaavocado.com/recipes/recipe-container/[place]-avocado-mayo 6. Choose a DIY salad dressing instead of the store-bought kind. Mix Dijon or whole grain  mustard with low-fat Kefir or red wine vinegar and garlic. 7. Use hummus as a spread instead of a dip. Use hummus as a spread on a high-fiber cracker or tortilla with a sandwich and save on calories without sacrificing taste. 8. Pick just one salad "accessory." Salad isn't automatically a calorie winner. It's easy to over-accessorize with toppings. Instead of topping your salad with nuts, avocado and cranberries (all three will clock in at 313 calories), just pick one. The next day, choose a different accessory, which will also keep your salad interesting. You don't wear all your jewelry every day, right? 9. Ditch the white pasta in favor of spaghetti squash. One cup of cooked spaghetti squash has about 40 calories, compared with traditional spaghetti, which comes with more than 200. Spaghetti squash is also nutrient-dense. It's a good source of fiber and Vitamins A and C, and it can be eaten just like you would eat pasta-with a great tomato sauce and [place] meatballs or with pesto, tofu and spinach, for example. 10. Dress up your chili, soups and stews with non-fat [place] yogurt instead of sour cream. Just a 'dollop' of sour cream can set you back 115 calories and  a whopping 12g of fat-seven of which are of the artery-clogging variety. Added bonus: [place] yogurt is packed with muscle-building protein, calcium and B Vitamins. 11. Mash cauliflower instead of mashed potatoes. One cup of traditional mashed potatoes-in all their creamy goodness-has more than 200 calories, compared to mashed cauliflower, which you can typically eat for less than 100 calories per 1 cup serving. Cauliflower is a great source of the antioxidant indole-3-carbinol (I3C), which may help reduce the risk of some cancers, like breast cancer. 12. Ditch the ice cream sundae in favor of a [place] yogurt parfait. Instead of a cup of ice cream or fro-yo for dessert, try 1 cup of nonfat Greek yogurt topped with fresh berries and a sprinkle of  cacao nibs. Both toppings are packed with antioxidants, which can help reduce cellular inflammation and oxidative damage. And the comparison is a no-brainer: One cup of ice cream has about 275 calories; one cup of frozen yogurt has about 230; and a cup of Greek yogurt has just 130, plus twice the protein, so you're less likely to return to the freezer for a second helping. 13. Put olive oil in a spray container instead of using it directly from the bottle. Each tablespoon of olive oil is 120 calories and 15g of fat. Use a mister instead of pouring it straight into the pan or onto a salad. This allows for portion control and will save you more than 100 calories. 14. When baking, substitute canned pumpkin for butter or oil. Canned pumpkin-not pumpkin pie mix-is loaded with Vitamin A, which is important for skin and eye health, as well as immunity. And the comparisons are pretty crazy:  cup of canned pumpkin has about 40 calories, compared to butter or oil, which has more than 800 calories. Yes, 800 calories. Applesauce and mashed banana can also serve as good substitutions for butter or oil, usually in a 1:1 ratio. 15. Top casseroles with high-fiber cereal instead of breadcrumbs. Breadcrumbs are typically made with white bread, while breakfast cereals contain 5-9g of fiber per serving. Not only will you save more than 150 calories per  cup serving, the swap will also keep you more full and you'll get a metabolism boost from the added fiber. 16. Snack on pistachios instead of macadamia nuts. Believe it or not, you get the same amount of calories from 35 pistachios (100 calories) as you would from only five macadamia nuts. 17. Chow down on kale chips rather than potato chips. This is my favorite 'don't knock it 'till you try it' swap. Kale chips are so easy to make at home, and you can spice them up with a little grated parmesan or chili powder. Plus, they're a mere fraction of the calories of potato chips,  but with the same crunch factor we crave so often. 18. Add seltzer and some fruit slices to your cocktail instead of soda or fruit juice. One cup of soda or fruit juice can pack on as much as 140 calories. Instead, use seltzer and fruit slices. The fruit provides valuable phytochemicals, such as flavonoids and anthocyanins, which help to combat cancer and stave off the aging process.

## 2017-03-13 NOTE — Assessment & Plan Note (Signed)
Treatment per Endo. Dr Buddy Duty

## 2017-03-14 ENCOUNTER — Telehealth: Payer: Self-pay

## 2017-03-14 NOTE — Patient Outreach (Signed)
Called [facility] to schedule a follow up appointment, there was no answer, left message to call me back. 

## 2017-03-16 ENCOUNTER — Ambulatory Visit: Payer: Self-pay | Admitting: General Surgery

## 2017-03-16 DIAGNOSIS — K429 Umbilical hernia without obstruction or gangrene: Secondary | ICD-10-CM | POA: Diagnosis not present

## 2017-03-16 NOTE — H&P (Signed)
Robin Greene 03/16/2017 1:38 PM Location: [facility] Patient #: [id number] DOB: 03/30/1956 Married / Language: Cleophus Molt / Race: White Female   History of Present Illness Odis Hollingshead MD; 03/16/2017 2:07 PM) The patient is a 61 year old female.  Note:She is referred by Dr. Raliegh Scarlet for consultation regarding a symptomatic umbilical hernia. The hernia has been present for about three years. In December 2015 she is having a lot of lifting and moving. She started developing more discomfort and felt the hernia was a little larger. No obstruction symptoms. She is here to discuss possible repair. She also reports a history of hiatal hernia.  Past Surgical History (April Staton, CMA; 03/16/2017 1:38 PM) Hysterectomy (not due to cancer) - Complete   Diagnostic Studies History (April Staton, [place]; 03/16/2017 1:38 PM) Colonoscopy  1-5 years ago Mammogram  within last year Pap Smear  1-5 years ago  Allergies (April Staton, CMA; 03/16/2017 1:41 PM) Aspirin *ANALGESICS - NonNarcotic*  Atorvastatin Calcium *ANTIHYPERLIPIDEMICS*  Invokana *ANTIDIABETICS*  Loratadine *ANTIHISTAMINES*  Rosuvastatin Calcium *ANTIHYPERLIPIDEMICS*  Simvastatin *ANTIHYPERLIPIDEMICS*   Medication History (April Staton, CMA; 03/16/2017 1:43 PM) Synthroid (137MCG Tablet, Oral) Active. Lantus SoloStar (100UNIT/ML Soln Pen-inj, Subcutaneous) Active. MetFORMIN HCl (1000MG  Tablet, Oral) Active. Lisinopril (40MG  Tablet, Oral) Active. Medications Reconciled  Social History (April Staton, CMA; 03/16/2017 1:38 PM) Alcohol use  Occasional alcohol use. Caffeine use  Coffee, Tea. No drug use  Tobacco use  Never smoker.  Family History (April Staton, [place]; 03/16/2017 1:38 PM) Arthritis  Mother. Heart disease in female family member before age 71  Hypertension  Father, Mother.  Pregnancy / Birth History (April Staton, [place]; 03/16/2017 1:38 PM) Age at menarche  74 years. Age of menopause  >31 Gravida   2 Maternal age  50-30 Para  57  Other Problems (April Staton, CMA; 03/16/2017 1:38 PM) Diabetes Mellitus  High blood pressure  Hypercholesterolemia  Oophorectomy  Bilateral. Thyroid Disease  Umbilical Hernia Repair     Review of Systems (April Staton CMA; 03/16/2017 1:38 PM) General Not Present- Appetite Loss, Chills, Fatigue, Fever, Night Sweats, Weight Gain and Weight Loss. Skin Not Present- Change in Wart/Mole, Dryness, Hives, Jaundice, New Lesions, Non-Healing Wounds, Rash and Ulcer. HEENT Present- Seasonal Allergies. Not Present- Earache, Hearing Loss, Hoarseness, Nose Bleed, Oral Ulcers, Ringing in the Ears, Sinus Pain, Sore Throat, Visual Disturbances, Wears glasses/contact lenses and Yellow Eyes. Respiratory Not Present- Bloody sputum, Chronic Cough, Difficulty Breathing, Snoring and Wheezing. Breast Not Present- Breast Mass, Breast Pain, Nipple Discharge and Skin Changes. Cardiovascular Not Present- Chest Pain, Difficulty Breathing Lying Down, Leg Cramps, Palpitations, Rapid Heart Rate, Shortness of Breath and Swelling of Extremities. Gastrointestinal Present- Bloating. Not Present- Abdominal Pain, Bloody Stool, Change in Bowel Habits, Chronic diarrhea, Constipation, Difficulty Swallowing, Excessive gas, Gets full quickly at meals, Hemorrhoids, Indigestion, Nausea, Rectal Pain and Vomiting. Female Genitourinary Not Present- Frequency, Nocturia, Painful Urination, Pelvic Pain and Urgency. Musculoskeletal Not Present- Back Pain, Joint Pain, Joint Stiffness, Muscle Pain, Muscle Weakness and Swelling of Extremities. Neurological Not Present- Decreased Memory, Fainting, Headaches, Numbness, Seizures, Tingling, Tremor, Trouble walking and Weakness. Psychiatric Not Present- Anxiety, Bipolar, Change in Sleep Pattern, Depression, Fearful and Frequent crying. Endocrine Present- New Diabetes. Not Present- Cold Intolerance, Excessive Hunger, Hair Changes, Heat Intolerance and Hot  flashes.  Vitals (April Staton CMA; 03/16/2017 1:43 PM) 03/16/2017 1:43 PM Weight: 186.5 lb Temp.: 45F(Oral)  Pulse: 88 (Regular)  BP: 140/74 (Sitting, Left Arm, Standard)       Physical Exam Odis Hollingshead MD;  03/16/2017 2:08 PM) The physical exam findings are as follows: Note:GENERAL APPEARANCE: Overweight female in NAD. Pleasant and cooperative.  EARS, NOSE, MOUTH THROAT: [place]/AT external ears: no lesions or deformities external nose: no lesions or deformities hearing: grossly normal lips: moist, no deformities EYES external: conjunctiva, lids, sclerae normal pupils: equal, round glasses: no  CV ascultation: RRR, no murmur extremity edema: no extremity varicosities: no  RESP auscultation: breath sounds equal and clear respiratory effort: normal   GASTROINTESTINAL abdomen: Soft, non-tender, non-distended, no masses liver and spleen: not enlarged. hernia: Reducible umbilical hernia present   MUSCULOSKELETAL station and gait: normal digits/nails: no clubbing or cyanosis deformities: none instability: none  SKIN jaundice: none  NEUROLOGIC speech: normal  PSYCHIATRIC alertness and orientation: normal mood/affect/behavior: normal judgement and insight: normal    Assessment & Plan Odis Hollingshead MD; 6/0/0459 9:77 PM) UMBILICAL HERNIA WITHOUT OBSTRUCTION OR GANGRENE (K42.9) Impression: This is being symptomatic ever since Christmas of 2017 when she was very active. We discussed open repair with mesh and she is interested in this.  Plan: Schedule open umbilical hernia repair with mesh. I have discussed the procedure, risks, and aftercare. Risks include but are not limited to bleeding, infection, wound healing problems, anesthesia, recurrence, injury to intra-abdominal organs, cosmetic deformity. She seems to understand and would like to proceed.  Jackolyn Confer, M.D.

## 2017-03-21 MED FILL — LIOTHYRONINE SODIUM 5 MCG T: 5 | 30 days supply | Qty: 60 | Fill #1

## 2017-03-27 MED FILL — LANTUS SOLOSTAR 100 UNITS/M: 100 | 87 days supply | Qty: 45 | Fill #1

## 2017-04-02 MED FILL — IPRATROPIUM 0.06% SPRAY: 0.06 | 30 days supply | Qty: 15 | Fill #1

## 2017-04-02 MED FILL — FLUTICASONE PROP 50 MCG SPR: 50 | 30 days supply | Qty: 16 | Fill #3

## 2017-04-02 MED FILL — SYNTHROID 137 MCG TABLET: 137 | 90 days supply | Qty: 90 | Fill #1

## 2017-04-02 MED FILL — HYDROCHLOROTHIAZIDE 25 MG T: 25 | 60 days supply | Qty: 60 | Fill #2

## 2017-04-02 MED FILL — LISINOPRIL 40 MG TAB: 40 | 90 days supply | Qty: 90 | Fill #1

## 2017-04-16 MED FILL — metFORMIN HCL 1000 MG TABS: 1000 | 90 days supply | Qty: 180 | Fill #0

## 2017-04-16 MED FILL — LIOTHYRONINE SODIUM 5 MCG T: 5 | 30 days supply | Qty: 60 | Fill #2

## 2017-04-17 NOTE — Pre-Procedure Instructions (Signed)
ANDREIA GANDOLFI  04/17/2017      [place], [name] - [address] [facility]. [address] [name] [address] Phone: [phone] Fax: [phone]    Your procedure is scheduled on   Monday  04/30/17  Report to [place] at 530 A.M.  Call this number if you have problems the morning of surgery:  [phone]   Remember:  Do not eat food or drink liquids after midnight.  Take these medicines the morning of surgery with A SIP OF WATER  -  ALBUTEROL INHALER IF NEEDED, LIOTHYRONINE(CYTOMEL), SYNTHROID  7 days prior to surgery STOP taking any Aspirin, Aleve, Naproxen, Ibuprofen, Motrin, Advil, Goody's, BC's, all herbal medications, fish oil, and all vitamins    How to Manage Your Diabetes Before and After Surgery  Why is it important to control my blood sugar before and after surgery? . Improving blood sugar levels before and after surgery helps healing and can limit problems. . A way of improving blood sugar control is eating a healthy diet by: o  Eating less sugar and carbohydrates o  Increasing activity/exercise o  Talking with your doctor about reaching your blood sugar goals . High blood sugars (greater than 180 mg/dL) can raise your risk of infections and slow your recovery, so you will need to focus on controlling your diabetes during the weeks before surgery. . Make sure that the doctor who takes care of your diabetes knows about your planned surgery including the date and location.  How do I manage my blood sugar before surgery? . Check your blood sugar at least 4 times a day, starting 2 days before surgery, to make sure that the level is not too high or low. o Check your blood sugar the morning of your surgery when you wake up and every 2 hours until you get to the Short Stay unit. . If your blood sugar is less than 70 mg/dL, you will need to treat for low blood sugar: o Do not take insulin. o Treat a low  blood sugar (less than 70 mg/dL) with  cup of clear juice (cranberry or apple), 4 glucose tablets, OR glucose gel. o Recheck blood sugar in 15 minutes after treatment (to make sure it is greater than 70 mg/dL). If your blood sugar is not greater than 70 mg/dL on recheck, call [phone] for further instructions. . Report your blood sugar to the short stay nurse when you get to Short Stay.  . If you are admitted to the hospital after surgery: o Your blood sugar will be checked by the staff and you will probably be given insulin after surgery (instead of oral diabetes medicines) to make sure you have good blood sugar levels. o The goal for blood sugar control after surgery is 80-180 mg/dL.              WHAT DO I DO ABOUT MY DIABETES MEDICATION?   Marland Kitchen Do not take oral diabetes medicines (pills) the morning of surgery.  . THE NIGHT BEFORE SURGERY, take __20_________ units of _______LANTUS___insulin.        . The day of surgery, do not take other diabetes injectables, including Byetta (exenatide), Bydureon (exenatide ER), Victoza (liraglutide), or Trulicity (dulaglutide).  . If your CBG is greater than 220 mg/dL, you may take  of your sliding scale (correction) dose of insulin.  Other Instructions:          Patient Signature:  Date:  Nurse Signature:  Date:   Reviewed and Endorsed by [facility] Patient Education Committee, August 2015  Do not wear jewelry, make-up or nail polish.  Do not wear lotions, powders, or perfumes, or deoderant.  Do not shave 48 hours prior to surgery.  Men may shave face and neck.  Do not bring valuables to the hospital.  [facility] is not responsible for any belongings or valuables.  Contacts, dentures or bridgework may not be worn into surgery.  Leave your suitcase in the car.  After surgery it may be brought to your room.  For patients admitted to the hospital, discharge time will be determined by your treatment team.  Patients  discharged the day of surgery will not be allowed to drive home.   Name and phone number of your driver:    Special instructions:  SEE PREPARING FOR SURGERY  Please read over the following fact sheets that you were given. Pain Booklet and Surgical Site Infection Prevention

## 2017-04-19 ENCOUNTER — Encounter (HOSPITAL_COMMUNITY)
Admission: RE | Admit: 2017-04-19 | Discharge: 2017-04-19 | Disposition: A | Discharge status: Home / Self Care | Payer: 59 | Source: Ambulatory Visit | Attending: General Surgery | Admitting: General Surgery

## 2017-04-19 ENCOUNTER — Encounter (HOSPITAL_COMMUNITY): Payer: Self-pay

## 2017-04-19 DIAGNOSIS — Z01810 Encounter for preprocedural cardiovascular examination: Secondary | ICD-10-CM | POA: Insufficient documentation

## 2017-04-19 DIAGNOSIS — Z01812 Encounter for preprocedural laboratory examination: Secondary | ICD-10-CM | POA: Insufficient documentation

## 2017-04-19 HISTORY — DX: Other specified postprocedural states: Z98.890

## 2017-04-19 HISTORY — DX: Other complications of anesthesia, initial encounter: T88.59XA

## 2017-04-19 HISTORY — DX: Nausea with vomiting, unspecified: R11.2

## 2017-04-19 HISTORY — DX: Unspecified osteoarthritis, unspecified site: M19.90

## 2017-04-19 HISTORY — DX: Adverse effect of unspecified anesthetic, initial encounter: T41.45XA

## 2017-04-19 HISTORY — DX: Gastro-esophageal reflux disease without esophagitis: K21.9

## 2017-04-19 LAB — BASIC METABOLIC PANEL
Anion gap: 9 (ref 5–15)
BUN: 19 mg/dL (ref 6–20)
CO2: 25 mmol/L (ref 22–32)
Calcium: 10 mg/dL (ref 8.9–10.3)
Chloride: 100 mmol/L — ABNORMAL LOW (ref 101–111)
Creatinine, Ser: 0.76 mg/dL (ref 0.44–1.00)
GFR calc Af Amer: >60 mL/min (ref >60)
GLUCOSE: 143 mg/dL — AB (ref 65–99)
POTASSIUM: 4.2 mmol/L (ref 3.5–5.1)
Sodium: 134 mmol/L — ABNORMAL LOW (ref 135–145)

## 2017-04-19 LAB — CBC
HCT: 33.6 % — ABNORMAL LOW (ref 36.0–46.0)
Hemoglobin: 10.4 g/dL — ABNORMAL LOW (ref 12.0–15.0)
MCH: 22.9 pg — AB (ref 26.0–34.0)
MCHC: 31 g/dL (ref 30.0–36.0)
MCV: 73.8 fL — AB (ref 78.0–100.0)
PLATELETS: 388 10*3/uL (ref 150–400)
RBC: 4.55 MIL/uL (ref 3.87–5.11)
RDW: 17.2 % — AB (ref 11.5–15.5)
WBC: 6.9 10*3/uL (ref 4.0–10.5)

## 2017-04-19 MED FILL — FREESTYLE LITE TEST STRIP: 90 days supply | Qty: 200 | Fill #0

## 2017-04-19 MED FILL — FREESTYLE LANCETS: 90 days supply | Qty: 200 | Fill #0

## 2017-04-19 MED FILL — FREESTYLE LITE METER: 30 days supply | Qty: 1 | Fill #0

## 2017-04-19 NOTE — Progress Notes (Signed)
PCP is Dr. [facility].  LOV 107-120 Last A1C 03/13/17  7.9 Denies any cardiac issues or problems.

## 2017-04-19 NOTE — Pre-Procedure Instructions (Signed)
Robin Greene  04/19/2017      [place], [name] - [address] [facility]. [address] [name] [address] Phone: [phone] Fax: [phone]    Your procedure is scheduled on   Monday  04/30/17   Report to [place] at 530 A.M.             (posted surgery time 7:30 - 9:00 AM)   Call this number if you have problems the morning of surgery:  [phone], for other questions, call [phone] Mon-Fri from 8-4pm   Remember:   Do not eat food or drink liquids after midnight Sunday.   Take these medicines the morning of surgery with A SIP OF WATER  -  ALBUTEROL INHALER IF NEEDED, LIOTHYRONINE(CYTOMEL), SYNTHROID  7 days prior to surgery STOP taking any Aspirin, Aleve, Naproxen, Ibuprofen, Motrin, Advil, Goody's, BC's, all herbal medications, fish oil, and all vitamins    How to Manage Your Diabetes Before and After Surgery  Why is it important to control my blood sugar before and after surgery? . Improving blood sugar levels before and after surgery helps healing and can limit problems. . A way of improving blood sugar control is eating a healthy diet by: o  Eating less sugar and carbohydrates o  Increasing activity/exercise o  Talking with your doctor about reaching your blood sugar goals . High blood sugars (greater than 180 mg/dL) can raise your risk of infections and slow your recovery, so you will need to focus on controlling your diabetes during the weeks before surgery. . Make sure that the doctor who takes care of your diabetes knows about your planned surgery including the date and location.  How do I manage my blood sugar before surgery? . Check your blood sugar at least 4 times a day, starting 2 days before surgery, to make sure that the level is not too high or low. o Check your blood sugar the morning of your surgery when you wake up and every 2 hours until you get to the Short Stay unit. o  . If  your blood sugar is less than 70 mg/dL, you will need to treat for low blood sugar: o Do not take insulin. o Treat a low blood sugar (less than 70 mg/dL) with  cup of clear juice (cranberry or apple), 4 glucose tablets, OR glucose gel. o  o Recheck blood sugar in 15 minutes after treatment (to make sure it is greater than 70 mg/dL). If your blood sugar is not greater than 70 mg/dL on recheck, call [phone] for further instructions. . Report your blood sugar to the short stay nurse when you get to Short Stay.  . If you are admitted to the hospital after surgery: o Your blood sugar will be checked by the staff and you will probably be given insulin after surgery (instead of oral diabetes medicines) to make sure you have good blood sugar levels. o The goal for blood sugar control after surgery is 80-180 mg/dL.              WHAT DO I DO ABOUT MY DIABETES MEDICATION?   Marland Kitchen Do not take oral diabetes medicines (pills) the morning of surgery.  . THE NIGHT BEFORE SURGERY, take __20_________ units of _______LANTUS___insulin.               The day of surgery, do not take other diabetes injectables, including Byetta (exenatide), Bydureon (exenatide ER), Victoza (  liraglutide), or Trulicity (dulaglutide).  . If your CBG is greater than 220 mg/dL, you may take  of your sliding scale (correction) dose of insulin.  Other Instructions:          Patient Signature:  Date:   Nurse Signature:  Date:   Reviewed and Endorsed by [facility] Patient Education Committee, August 2015     Do not wear jewelry, make-up or nail polish.  Do not wear lotions, powders, or perfumes, or deoderant.  Do not shave 48 hours prior to surgery.     Do not bring valuables to the hospital.  [facility] is not responsible for any belongings or valuables.  Contacts, dentures or bridgework may not be worn into surgery.  Leave your suitcase in the car.  After surgery it may be brought to your room.  For  patients admitted to the hospital, discharge time will be determined by your treatment team.  Patients discharged the day of surgery will not be allowed to drive home and will need someone to stay with you for the first 24 hrs after.  Please read over the following fact sheets that you were given. Pain Booklet and Surgical Site Infection Prevention

## 2017-04-23 MED FILL — VICTOZA 18 MG/3 ML INJECT P: 18 | 90 days supply | Qty: 27 | Fill #1

## 2017-04-25 ENCOUNTER — Other Ambulatory Visit (HOSPITAL_COMMUNITY): Payer: 59

## 2017-04-29 MED ORDER — CEFAZOLIN SODIUM-DEXTROSE 2-4 GM/100ML-% IV SOLN
2.0000 g | INTRAVENOUS | Status: AC
  Administered 2017-04-30: 2 g via INTRAVENOUS
  Filled 2017-04-29: qty 100

## 2017-04-30 ENCOUNTER — Encounter (HOSPITAL_COMMUNITY): Payer: Self-pay

## 2017-04-30 ENCOUNTER — Encounter (HOSPITAL_COMMUNITY)
Admission: RE | Disposition: A | Discharge status: Home / Self Care | Payer: Self-pay | Source: Ambulatory Visit | Attending: General Surgery

## 2017-04-30 ENCOUNTER — Ambulatory Visit (HOSPITAL_COMMUNITY): Payer: 59 | Admitting: Certified Registered Nurse Anesthetist

## 2017-04-30 ENCOUNTER — Ambulatory Visit (HOSPITAL_COMMUNITY)
Admission: RE | Admit: 2017-04-30 | Discharge: 2017-04-30 | Disposition: A | Discharge status: Home / Self Care | Payer: 59 | Source: Ambulatory Visit | Attending: General Surgery | Admitting: General Surgery

## 2017-04-30 DIAGNOSIS — E039 Hypothyroidism, unspecified: Secondary | ICD-10-CM | POA: Diagnosis not present

## 2017-04-30 DIAGNOSIS — E119 Type 2 diabetes mellitus without complications: Secondary | ICD-10-CM | POA: Diagnosis not present

## 2017-04-30 DIAGNOSIS — Z79899 Other long term (current) drug therapy: Secondary | ICD-10-CM | POA: Insufficient documentation

## 2017-04-30 DIAGNOSIS — D649 Anemia, unspecified: Secondary | ICD-10-CM | POA: Diagnosis not present

## 2017-04-30 DIAGNOSIS — I10 Essential (primary) hypertension: Secondary | ICD-10-CM | POA: Insufficient documentation

## 2017-04-30 DIAGNOSIS — K432 Incisional hernia without obstruction or gangrene: Secondary | ICD-10-CM | POA: Diagnosis not present

## 2017-04-30 DIAGNOSIS — Z886 Allergy status to analgesic agent status: Secondary | ICD-10-CM | POA: Insufficient documentation

## 2017-04-30 DIAGNOSIS — K429 Umbilical hernia without obstruction or gangrene: Secondary | ICD-10-CM | POA: Diagnosis not present

## 2017-04-30 DIAGNOSIS — E7800 Pure hypercholesterolemia, unspecified: Secondary | ICD-10-CM | POA: Diagnosis not present

## 2017-04-30 DIAGNOSIS — Z888 Allergy status to other drugs, medicaments and biological substances status: Secondary | ICD-10-CM | POA: Insufficient documentation

## 2017-04-30 DIAGNOSIS — Z8249 Family history of ischemic heart disease and other diseases of the circulatory system: Secondary | ICD-10-CM | POA: Diagnosis not present

## 2017-04-30 DIAGNOSIS — Z794 Long term (current) use of insulin: Secondary | ICD-10-CM | POA: Diagnosis not present

## 2017-04-30 HISTORY — PX: INSERTION OF MESH: SHX5868

## 2017-04-30 HISTORY — PX: UMBILICAL HERNIA REPAIR: SHX196

## 2017-04-30 LAB — GLUCOSE, CAPILLARY
GLUCOSE-CAPILLARY: 143 mg/dL — AB (ref 65–99)
GLUCOSE-CAPILLARY: 147 mg/dL — AB (ref 65–99)

## 2017-04-30 SURGERY — REPAIR, HERNIA, UMBILICAL, ADULT
Anesthesia: General | Site: Abdomen

## 2017-04-30 MED ORDER — BUPIVACAINE-EPINEPHRINE (PF) 0.5% -1:200000 IJ SOLN
INTRAMUSCULAR | Status: AC
  Filled 2017-04-30: qty 30

## 2017-04-30 MED ORDER — 0.9 % SODIUM CHLORIDE (POUR BTL) OPTIME
TOPICAL | Status: DC | PRN
  Administered 2017-04-30: 1000 mL

## 2017-04-30 MED ORDER — LACTATED RINGERS IV SOLN
INTRAVENOUS | Status: DC | PRN
  Administered 2017-04-30: 07:00:00 via INTRAVENOUS

## 2017-04-30 MED ORDER — OXYCODONE HCL 5 MG/5ML PO SOLN: 5.0000 mg | Freq: Once | ORAL | Status: AC | PRN

## 2017-04-30 MED ORDER — PROMETHAZINE HCL 25 MG/ML IJ SOLN: 6.2500 mg | INTRAMUSCULAR | Status: DC | PRN

## 2017-04-30 MED ORDER — SUGAMMADEX SODIUM 200 MG/2ML IV SOLN
INTRAVENOUS | Status: DC | PRN
  Administered 2017-04-30: 150 mg via INTRAVENOUS

## 2017-04-30 MED ORDER — SODIUM CHLORIDE 0.9% FLUSH: 3.0000 mL | INTRAVENOUS | Status: DC | PRN

## 2017-04-30 MED ORDER — ONDANSETRON HCL 4 MG/2ML IJ SOLN
INTRAMUSCULAR | Status: DC | PRN
  Administered 2017-04-30: 4 mg via INTRAVENOUS

## 2017-04-30 MED ORDER — SCOPOLAMINE 1 MG/3DAYS TD PT72
MEDICATED_PATCH | TRANSDERMAL | Status: DC | PRN
  Administered 2017-04-30: 1.5 mg via TRANSDERMAL

## 2017-04-30 MED ORDER — ROCURONIUM BROMIDE 10 MG/ML (PF) SYRINGE
PREFILLED_SYRINGE | INTRAVENOUS | Status: DC | PRN
  Administered 2017-04-30: 10 mg via INTRAVENOUS
  Administered 2017-04-30: 40 mg via INTRAVENOUS
  Administered 2017-04-30: 10 mg via INTRAVENOUS

## 2017-04-30 MED ORDER — LIDOCAINE 2% (20 MG/ML) 5 ML SYRINGE
INTRAMUSCULAR | Status: DC | PRN
  Administered 2017-04-30: 60 mg via INTRAVENOUS

## 2017-04-30 MED ORDER — ONDANSETRON HCL 4 MG PO TABS: 4.0000 mg | ORAL_TABLET | ORAL | 0 refills | Status: DC | PRN

## 2017-04-30 MED ORDER — OXYCODONE HCL 5 MG PO TABS: 5.0000 mg | ORAL_TABLET | ORAL | 0 refills | Status: DC | PRN

## 2017-04-30 MED ORDER — LIDOCAINE HCL (CARDIAC) 20 MG/ML IV SOLN
INTRAVENOUS | Status: AC
  Filled 2017-04-30: qty 5

## 2017-04-30 MED ORDER — FAMOTIDINE IN NACL 20-0.9 MG/50ML-% IV SOLN
20.0000 mg | Freq: Once | INTRAVENOUS | Status: DC
Start: 2017-04-30 — End: 2017-04-30

## 2017-04-30 MED ORDER — BUPIVACAINE-EPINEPHRINE 0.5% -1:200000 IJ SOLN
INTRAMUSCULAR | Status: DC | PRN
  Administered 2017-04-30: 30 mL

## 2017-04-30 MED ORDER — MIDAZOLAM HCL 2 MG/2ML IJ SOLN
INTRAMUSCULAR | Status: DC | PRN
  Administered 2017-04-30: 2 mg via INTRAVENOUS

## 2017-04-30 MED ORDER — ACETAMINOPHEN 325 MG PO TABS
650.0000 mg | ORAL_TABLET | ORAL | Status: DC | PRN
  Filled 2017-04-30: qty 2

## 2017-04-30 MED ORDER — FENTANYL CITRATE (PF) 250 MCG/5ML IJ SOLN
INTRAMUSCULAR | Status: AC
  Filled 2017-04-30: qty 5

## 2017-04-30 MED ORDER — DEXTROSE 5 % IV SOLN: INTRAVENOUS | Status: DC | PRN

## 2017-04-30 MED ORDER — EPHEDRINE 5 MG/ML INJ
INTRAVENOUS | Status: AC
  Filled 2017-04-30: qty 10

## 2017-04-30 MED ORDER — HYDROMORPHONE HCL 1 MG/ML IJ SOLN: 0.2500 mg | INTRAMUSCULAR | Status: DC | PRN

## 2017-04-30 MED ORDER — FENTANYL CITRATE (PF) 250 MCG/5ML IJ SOLN
INTRAMUSCULAR | Status: DC | PRN
  Administered 2017-04-30 (×2): 50 ug via INTRAVENOUS
  Administered 2017-04-30: 100 ug via INTRAVENOUS

## 2017-04-30 MED ORDER — ONDANSETRON HCL 4 MG/2ML IJ SOLN
INTRAMUSCULAR | Status: AC
  Filled 2017-04-30: qty 2

## 2017-04-30 MED ORDER — CHLORHEXIDINE GLUCONATE CLOTH 2 % EX PADS: 6.0000 | MEDICATED_PAD | Freq: Once | CUTANEOUS | Status: DC

## 2017-04-30 MED ORDER — PHENYLEPHRINE 40 MCG/ML (10ML) SYRINGE FOR IV PUSH (FOR BLOOD PRESSURE SUPPORT)
PREFILLED_SYRINGE | INTRAVENOUS | Status: AC
  Filled 2017-04-30: qty 10

## 2017-04-30 MED ORDER — OXYCODONE HCL 5 MG PO TABS: 5.0000 mg | ORAL_TABLET | ORAL | Status: DC | PRN

## 2017-04-30 MED ORDER — PHENYLEPHRINE 40 MCG/ML (10ML) SYRINGE FOR IV PUSH (FOR BLOOD PRESSURE SUPPORT)
PREFILLED_SYRINGE | INTRAVENOUS | Status: DC | PRN
  Administered 2017-04-30 (×5): 80 ug via INTRAVENOUS

## 2017-04-30 MED ORDER — PROPOFOL 10 MG/ML IV BOLUS
INTRAVENOUS | Status: DC | PRN
  Administered 2017-04-30: 140 mg via INTRAVENOUS

## 2017-04-30 MED ORDER — OXYCODONE HCL 5 MG PO TABS
5.0000 mg | ORAL_TABLET | Freq: Once | ORAL | Status: AC | PRN
  Administered 2017-04-30: 5 mg via ORAL

## 2017-04-30 MED ORDER — MORPHINE SULFATE (PF) 2 MG/ML IV SOLN: 2.0000 mg | INTRAVENOUS | Status: DC | PRN

## 2017-04-30 MED ORDER — OXYCODONE HCL 5 MG PO TABS
ORAL_TABLET | ORAL | Status: AC
  Filled 2017-04-30: qty 1

## 2017-04-30 MED ORDER — ACETAMINOPHEN 650 MG RE SUPP
650.0000 mg | RECTAL | Status: DC | PRN
  Filled 2017-04-30: qty 1

## 2017-04-30 MED ORDER — PROPOFOL 10 MG/ML IV BOLUS
INTRAVENOUS | Status: AC
Start: 2017-04-30 — End: 2017-04-30
  Filled 2017-04-30: qty 20

## 2017-04-30 MED ORDER — SUCCINYLCHOLINE CHLORIDE 200 MG/10ML IV SOSY
PREFILLED_SYRINGE | INTRAVENOUS | Status: AC
  Filled 2017-04-30: qty 10

## 2017-04-30 MED ORDER — MIDAZOLAM HCL 2 MG/2ML IJ SOLN
INTRAMUSCULAR | Status: AC
  Filled 2017-04-30: qty 2

## 2017-04-30 MED ORDER — ROCURONIUM BROMIDE 50 MG/5ML IV SOLN
INTRAVENOUS | Status: AC
  Filled 2017-04-30: qty 1

## 2017-04-30 SURGICAL SUPPLY — 40 items
BENZOIN TINCTURE PRP APPL 2/3 (GAUZE/BANDAGES/DRESSINGS) ×2 IMPLANT
BLADE SURG 15 STRL LF DISP TIS (BLADE) ×1 IMPLANT
BLADE SURG 15 STRL SS (BLADE) ×1
CHLORAPREP W/TINT 26ML (MISCELLANEOUS) ×2 IMPLANT
COVER SURGICAL LIGHT HANDLE (MISCELLANEOUS) ×2 IMPLANT
DECANTER SPIKE VIAL GLASS SM (MISCELLANEOUS) ×2 IMPLANT
DRAPE INCISE IOBAN 66X45 STRL (DRAPES) ×2 IMPLANT
DRAPE LAPAROSCOPIC ABDOMINAL (DRAPES) ×2 IMPLANT
DRAPE UTILITY XL STRL (DRAPES) ×2 IMPLANT
DRSG TEGADERM 4X4.75 (GAUZE/BANDAGES/DRESSINGS) ×2 IMPLANT
ELECT CAUTERY BLADE 6.4 (BLADE) ×2 IMPLANT
ELECT REM PT RETURN 9FT ADLT (ELECTROSURGICAL) ×2
ELECTRODE REM PT RTRN 9FT ADLT (ELECTROSURGICAL) ×1 IMPLANT
GAUZE SPONGE 4X4 12PLY STRL (GAUZE/BANDAGES/DRESSINGS) IMPLANT
GAUZE SPONGE 4X4 12PLY STRL LF (GAUZE/BANDAGES/DRESSINGS) ×2 IMPLANT
GAUZE SPONGE 4X4 16PLY XRAY LF (GAUZE/BANDAGES/DRESSINGS) ×2 IMPLANT
GLOVE BIOGEL PI IND STRL 8 (GLOVE) ×1 IMPLANT
GLOVE BIOGEL PI INDICATOR 8 (GLOVE) ×1
GLOVE ECLIPSE 8.0 STRL XLNG CF (GLOVE) ×2 IMPLANT
GOWN STRL REUS W/ TWL LRG LVL3 (GOWN DISPOSABLE) ×2 IMPLANT
GOWN STRL REUS W/TWL LRG LVL3 (GOWN DISPOSABLE) ×2
KIT BASIN OR (CUSTOM PROCEDURE TRAY) ×2 IMPLANT
KIT ROOM TURNOVER OR (KITS) ×2 IMPLANT
MESH HERNIA 3X6 (Mesh General) ×2 IMPLANT
NEEDLE HYPO 25GX1X1/2 BEV (NEEDLE) ×2 IMPLANT
NS IRRIG 1000ML POUR BTL (IV SOLUTION) ×2 IMPLANT
PACK SURGICAL SETUP 50X90 (CUSTOM PROCEDURE TRAY) ×2 IMPLANT
PAD ARMBOARD 7.5X6 YLW CONV (MISCELLANEOUS) ×2 IMPLANT
PENCIL BUTTON HOLSTER BLD 10FT (ELECTRODE) ×2 IMPLANT
STRIP CLOSURE SKIN 1/2X4 (GAUZE/BANDAGES/DRESSINGS) ×2 IMPLANT
SUT MNCRL AB 4-0 PS2 18 (SUTURE) ×2 IMPLANT
SUT PROLENE 0 CT 1 30 (SUTURE) ×2 IMPLANT
SUT SURG 0 T 19/GS 22 1969 62 (SUTURE) ×4 IMPLANT
SUT VIC AB 3-0 SH 27 (SUTURE) ×1
SUT VIC AB 3-0 SH 27XBRD (SUTURE) ×1 IMPLANT
SYR BULB 3OZ (MISCELLANEOUS) ×2 IMPLANT
SYR CONTROL 10ML LL (SYRINGE) ×2 IMPLANT
TOWEL OR 17X24 6PK STRL BLUE (TOWEL DISPOSABLE) ×2 IMPLANT
TOWEL OR 17X26 10 PK STRL BLUE (TOWEL DISPOSABLE) IMPLANT
WATER STERILE IRR 1000ML POUR (IV SOLUTION) IMPLANT

## 2017-04-30 NOTE — Op Note (Signed)
OPERATIVE NOTE- UMBILICAL HERNIA REPAIR  Preoperative diagnosis: Umbilical incisonal hernia  Postoperative diagnosis: Same  Procedure: Umbilical incisional hernia repair with mesh.  Surgeon: Jackolyn Confer M.D.  Anesthesia: General plus Marcaine local   Indication: This is a 61 year old female who has an umbilical hernia at the site of a previous subumbilical incision. It is symptomatic. She now presents for repair.  Technique: She was seen in the holding room and then brought to the operating room, placed supine on the operating table, and a general anesthetic was given.    The periumbilical area was sterilely prepped and draped. A timeout was performed.  Marcaine solution was infiltrated superficially and deep in the periumbilical area. A subumbilical transverse incision was made through the skin and subcutaneous tissue at the site of the previous scar until the fascia was identified. The subcutaneous tissue was mobilized free from the fascia inferiorly and laterally. The fascia was noted to be attenuated. The umbilical stalk was mobilized and dissected free from the fascia exposing the underlying hernia defect which measured approximately 2-2.5 cm. The hernia sac and tissue within it were reduced back into the abdominal cavity.  The subcutaneous tissue was freed from the fascia for 3-4 cm around the primary defect. The primary defect was then closed with interrupted 0 Surgilon sutures. The sutures were left long.  A piece of polypropylene mesh was brought into the field. The primary repair sutures were threaded up through the mesh and tied down anchoring the mesh directly over the primary repair. The periphery of the mesh was then anchored to the fascia with a running 0 Prolene suture to allow for 3-4 cm of mesh overlap. The excess mesh was trimmed and removed. The surrounding fascia was injected with the Marcaine solution.  Hemostasis was adequate at this time. The umbilicus was reimplanted  onto the mesh and fascia with 3-0 Vicryl suture. The subcutaneous tissue was closed over the mesh with a running 3-0 Vicryl suture. The skin was closed with a 4-0 Monocryl subcuticular stitch. Steri-Strips and sterile dressings were applied.  She tolerated the procedure well without any apparent complications and was taken to the recovery room in satisfactory condition.

## 2017-04-30 NOTE — Transfer of Care (Signed)
Immediate Anesthesia Transfer of Care Note  Patient: Robin Greene  Procedure(s) Performed: Procedure(s): UMBILICAL HERNIA REPAIR WITH MESH (N/A) INSERTION OF MESH (N/A)  Patient Location: PACU  Anesthesia Type:General  Level of Consciousness: awake, alert , oriented and patient cooperative  Airway & Oxygen Therapy: Patient Spontanous Breathing and Patient connected to nasal cannula oxygen  Post-op Assessment: Report given to RN, Post -op Vital signs reviewed and stable and Patient moving all extremities X 4  Post vital signs: Reviewed and stable  Last Vitals:  Vitals:   04/30/17 0602 04/30/17 0903  BP: (!) 130/52   Pulse: 77   Resp: 18   Temp: 36.6 C (P) 36.7 C    Last Pain:  Vitals:   04/30/17 0602  TempSrc: Oral         Complications: No apparent anesthesia complications

## 2017-04-30 NOTE — Anesthesia Procedure Notes (Addendum)
Procedure Name: Intubation Date/Time: 04/30/2017 7:48 AM Performed by: Julieta Bellini Pre-anesthesia Checklist: Patient identified, Emergency Drugs available, Suction available and Patient being monitored Patient Re-evaluated:Patient Re-evaluated prior to induction Oxygen Delivery Method: Circle system utilized Preoxygenation: Pre-oxygenation with 100% oxygen Induction Type: IV induction Ventilation: Mask ventilation without difficulty and Oral airway inserted - appropriate to patient size Laryngoscope Size: Mac and 3 Grade View: Grade I Tube type: Oral Tube size: 7.0 mm Number of attempts: 1 Airway Equipment and Method: Stylet Placement Confirmation: ETT inserted through vocal cords under direct vision,  positive ETCO2 and breath sounds checked- equal and bilateral Secured at: 23 cm Tube secured with: Tape Dental Injury: Teeth and Oropharynx as per pre-operative assessment  Comments: First attempt was made by Rosana Berger Grade 2 view with a Mac 3. Unable to pass tube. Wyatt Haste CRNA attempt x1 grade 2 view with Mac 3.

## 2017-04-30 NOTE — H&P (Signed)
Robin Greene  DOB: [birth year] Married / Language: English / Race: White Female   History of Present Illness  The patient is a 61 year old female.  Note:She was referred by Dr. Raliegh Scarlet for consultation regarding a symptomatic umbilical hernia. The hernia has been present for about three years. In December she was having to do a lot of lifting and moving. She started developing more discomfort and felt the hernia was a little larger. No obstruction symptoms.   Past Surgical History  Hysterectomy (not due to cancer) - Complete   Diagnostic Studies History Colonoscopy  1-5 years ago Mammogram  within last year Pap Smear  1-5 years ago  Allergies  Aspirin *ANALGESICS - NonNarcotic*  Atorvastatin Calcium *ANTIHYPERLIPIDEMICS*  Invokana *ANTIDIABETICS*  Loratadine *ANTIHISTAMINES*  Rosuvastatin Calcium *ANTIHYPERLIPIDEMICS*  Simvastatin *ANTIHYPERLIPIDEMICS*   Prior to Admission medications   Medication Sig Start Date End Date Taking? Authorizing Provider  cetirizine (ZYRTEC) 10 MG tablet Take 10 mg by mouth daily.   Yes [provider]  ferrous sulfate 325 (65 FE) MG tablet Take 325 mg by mouth See admin instructions. 3 times monthly   Yes [provider]  FOLIC ACID PO Take 1 tablet by mouth daily.   Yes [provider]  glucose blood test strip Inject 1 each into the skin as needed. Use as instructed   Yes [provider]  hydrochlorothiazide (HYDRODIURIL) 25 MG tablet Take 25 mg by mouth daily.  09/06/16  Yes [provider]  insulin glargine (LANTUS) 100 UNIT/ML injection Inject 40 Units into the skin at bedtime.    Yes [provider]  liothyronine (CYTOMEL) 5 MCG tablet Take 5 mcg by mouth 2 (two) times daily.   Yes [provider]  liraglutide (VICTOZA) 18 MG/3ML SOPN Inject 1.8 mg into the skin.   Yes [provider]  lisinopril (PRINIVIL,ZESTRIL) 40 MG tablet Take 40 mg by mouth daily.    Yes [provider]  metformin (FORTAMET) 1000 MG (OSM) 24 hr tablet Take 1,000 mg by mouth 2 (two) times daily.   Yes [provider]  metroNIDAZOLE (METROGEL) 1 % gel Apply 1 application topically daily as needed (rosacea).    Yes [provider]  Omega-3 Fatty Acids (FISH OIL) 1000 MG CAPS Take 1,000 mg by mouth every other day.    Yes [provider]  Probiotic Product (PROBIOTIC DAILY) CAPS Take 1 tablet by mouth daily.   Yes [provider]  SYNTHROID 137 MCG tablet Take 1 tablet by mouth daily. 09/29/16  Yes [provider]  valACYclovir (VALTREX) 1000 MG tablet Take 500 mg by mouth 2 (two) times daily as needed (fever blisters). 1/2 tablet twice daily for 5 days as needed for flares    Yes [provider]  albuterol (PROVENTIL HFA;VENTOLIN HFA) 108 (90 Base) MCG/ACT inhaler Inhale 2 puffs into the lungs every 6 (six) hours as needed for wheezing. Patient not taking: Reported on 04/16/2017 10/18/16   Mellody Dance, DO  azithromycin (ZITHROMAX) 250 MG tablet 2 tablets by mouth day 1, then 1 tablet by mouth days 2-5. Patient not taking: Reported on 04/16/2017 11/29/16   Mina Marble D, NP  chlorpheniramine-HYDROcodone (TUSSIONEX) 10-8 MG/5ML SUER Take 5 mLs by mouth every 12 (twelve) hours as needed for cough (cough, will cause drowsiness.). Patient not taking: Reported on 04/16/2017 10/18/16   Mellody Dance, DO     Social History  Alcohol use  Occasional alcohol use. Caffeine use  Coffee, Tea. No drug  use  Tobacco use  Never smoker.  Family History  Arthritis  Mother. Heart disease in female family member before age 33  Hypertension  Father, Mother.  Other Problems  Diabetes Mellitus  High blood pressure  Hypercholesterolemia  Oophorectomy  Bilateral. Thyroid Disease  Umbilical Hernia Repair    Physical Exam  The physical exam findings are as follows: Note:GENERAL APPEARANCE: Overweight female  in NAD. Pleasant and cooperative.  EARS, NOSE, MOUTH THROAT: [place]/AT external ears: no lesions or deformities external nose: no lesions or deformities hearing: grossly normal lips: moist, no deformities EYES external: conjunctiva, lids, sclerae normal pupils: equal, round glasses: no  CV ascultation: RRR, no murmur extremity edema: no extremity varicosities: no  RESP auscultation: breath sounds equal and clear respiratory effort: normal   GASTROINTESTINAL abdomen: Soft, non-tender, non-distended, no masses liver and spleen: not enlarged. hernia: Reducible umbilical hernia present   MUSCULOSKELETAL station and gait: normal digits/nails: no clubbing or cyanosis deformities: none instability: none  SKIN jaundice: none  NEUROLOGIC speech: normal  PSYCHIATRIC alertness and orientation: normal mood/affect/behavior: normal judgement and insight: normal    Assessment & Plan  UMBILICAL HERNIA WITHOUT OBSTRUCTION OR GANGRENE (K42.9) Impression: This is being symptomatic ever since Christmas of 2017 when she was very active. We discussed open repair with mesh and she is interested in this.  Plan: Schedule open umbilical hernia repair with mesh. I have discussed the procedure, risks, and aftercare. Risks include but are not limited to bleeding, infection, wound healing problems, anesthesia, recurrence, injury to intra-abdominal organs, cosmetic deformity. She seems to understand and would like to proceed.  Jackolyn Confer, M.D.

## 2017-04-30 NOTE — Discharge Instructions (Addendum)
[facility] _______[facility]   UMBILICAL HERNIA REPAIR: POST OP INSTRUCTIONS  Always review your discharge instruction sheet given to you by the facility where your surgery was performed. IF YOU HAVE DISABILITY OR FAMILY LEAVE FORMS, YOU MUST BRING THEM TO THE OFFICE FOR PROCESSING.   DO NOT GIVE THEM TO YOUR DOCTOR.  1. A  prescription for pain medication may be given to you upon discharge.  Take your pain medication as prescribed, if needed.  If narcotic pain medicine is not needed, then you may take acetaminophen (Tylenol) or ibuprofen (Advil) as needed. 2. Take your usually prescribed medications unless otherwise directed. 3. If you need a refill on your pain medication, please contact your pharmacy.  They will contact our office to request authorization. Prescriptions will not be filled after 5 pm or on week-ends. 4. You should follow a light diet the first 24 hours after arrival home, such as soup and crackers, etc.  Be sure to include lots of fluids daily.  Resume your normal diet the day after surgery. 5. Most patients will experience some swelling and bruising around the umbilicus (belly button).  Ice packs and reclining will help.  Swelling and bruising can take many days to resolve.  6. It is common to experience some constipation if taking pain medication after surgery.  Increasing fluid intake and taking a stool softener (such as Colace) will usually help or prevent this problem from occurring.  A mild laxative (Milk of Magnesia or Miralax) should be taken according to package directions if there are no bowel movements after 48 hours. 7. Unless discharge instructions indicate otherwise, you may remove your bandages 72 hours after surgery, and you may shower at that time.  You may have steri-strips (small skin tapes) in place directly over the incision.  These strips should be left on the skin.  If your surgeon used skin glue on the incision, you may shower in 24 hours.  The glue  will flake off over the next 2-3 weeks.  Any sutures or staples will be removed at the office during your follow-up visit. 8. ACTIVITIES:  You may resume regular (light) daily activities beginning the next day--such as daily self-care, walking, climbing stairs--gradually increasing activities as tolerated.  You may have sexual intercourse when it is comfortable.  Refrain from any heavy lifting or straining-nothing over 10 pounds for 6 weeks. Do not lie completely flat for the first 3 days. a. You may drive when you are no longer taking prescription pain medication, you can comfortably wear a seatbelt, and you can safely maneuver your car and apply brakes. b. RETURN TO WORK:  Desk work/Light work in 1-2 weeks, full duty in 6 weeks._________________________________________________________ 9. You should see your doctor in the office for a follow-up appointment approximately 2-3 weeks after your surgery.  Make sure that you call for this appointment within a day or two after you arrive home to insure a convenient appointment time. 10. OTHER INSTRUCTIONS:  __Follow up with your primary care physician regarding the anemia.________________________________________________________________________________________________________________________________________________________________________________________  WHEN TO CALL YOUR DOCTOR: 1. Fever over 101.0 2. Inability to urinate 3. Nausea and/or vomiting 4. Extreme swelling or bruising 5. Continued bleeding from incision. 6. Increased pain, redness, or drainage from the incision  The clinic staff is available to answer your questions during regular business hours.  Please dont hesitate to call and ask to speak to one of the nurses for clinical concerns.  If you have a medical emergency, go to the  nearest emergency room or call 911.  A surgeon from [facility] is always on call at the hospital   [address], [place], [place], [place]   [address] ?  [address], [place], [place]   [address] [phone] ? [phone] ? FAX [phone] Web site: [email or web address]

## 2017-04-30 NOTE — Anesthesia Preprocedure Evaluation (Addendum)
Anesthesia Evaluation  Patient identified by MRN, date of birth, ID band Patient awake    Reviewed: Allergy & Precautions, NPO status , Patient's Chart, lab work & pertinent test results  History of Anesthesia Complications (+) PONV and history of anesthetic complications  Airway Mallampati: IV  TM Distance: >3 FB Neck ROM: Full    Dental no notable dental hx. (+) Teeth Intact, Dental Advisory Given   Pulmonary neg pulmonary ROS,    Pulmonary exam normal breath sounds clear to auscultation       Cardiovascular hypertension, Pt. on medications Normal cardiovascular exam Rhythm:Regular Rate:Normal  ECG: NSR, rate 78   Neuro/Psych negative neurological ROS  negative psych ROS   GI/Hepatic Neg liver ROS, neg GERD  ,  Endo/Other  diabetes, Well Controlled, Insulin Dependent, Oral Hypoglycemic AgentsHypothyroidism   Renal/GU negative Renal ROS  negative genitourinary   Musculoskeletal negative musculoskeletal ROS (+)   Abdominal   Peds negative pediatric ROS (+)  Hematology  (+) anemia ,   Anesthesia Other Findings   Reproductive/Obstetrics negative OB ROS                           Anesthesia Physical Anesthesia Plan  ASA: III  Anesthesia Plan: General   Post-op Pain Management:    Induction: Intravenous  PONV Risk Score and Plan: 4 or greater and Ondansetron, Dexamethasone, Propofol, Midazolam and Scopolamine patch - Pre-op  Airway Management Planned: Oral ETT  Additional Equipment:   Intra-op Plan:   Post-operative Plan: Extubation in OR  Informed Consent: I have reviewed the patients History and Physical, chart, labs and discussed the procedure including the risks, benefits and alternatives for the proposed anesthesia with the patient or authorized representative who has indicated his/her understanding and acceptance.   Dental advisory given  Plan Discussed with: CRNA and  Anesthesiologist  Anesthesia Plan Comments:       Anesthesia Quick Evaluation

## 2017-04-30 NOTE — Anesthesia Postprocedure Evaluation (Signed)
Anesthesia Post Note  Patient: Robin Greene  Procedure(s) Performed: Procedure(s) (LRB): UMBILICAL HERNIA REPAIR WITH MESH (N/A) INSERTION OF MESH (N/A)     Patient location during evaluation: PACU Anesthesia Type: General Level of consciousness: awake and alert Pain management: pain level controlled Vital Signs Assessment: post-procedure vital signs reviewed and stable Respiratory status: spontaneous breathing, nonlabored ventilation, respiratory function stable and patient connected to nasal cannula oxygen Cardiovascular status: blood pressure returned to baseline and stable Postop Assessment: no signs of nausea or vomiting Anesthetic complications: no    Last Vitals:  Vitals:   04/30/17 1002 04/30/17 1005  BP:  119/62  Pulse: 73 73  Resp: 17   Temp: (!) 36.2 C     Last Pain:  Vitals:   04/30/17 1005  TempSrc:   PainSc: 2                  Ryan P Ellender

## 2017-05-01 ENCOUNTER — Encounter (HOSPITAL_COMMUNITY): Payer: Self-pay | Admitting: General Surgery

## 2017-05-09 ENCOUNTER — Other Ambulatory Visit: Payer: Self-pay | Admitting: Pharmacist

## 2017-05-09 NOTE — Patient Outreach (Signed)
[facility]) Care Management  05/09/2017  Robin Greene 12/28/1955 [id number]   61 y.o. year old female referred to [facility] for Diabetes (Link to Wellness - outreach call missed appointment )  Called patient and scheduled follow up appointment. Pt states that she missed the appointment as she recently had surgery and was unable to come. She could not find our phone number to cancel and apologized. Follow up appointment scheduled for 05/23/2017 @ 10:30 AM.   Carlean Jews, Pharm.D. PGY2 Ambulatory Care Pharmacy Resident Phone: [phone]

## 2017-05-21 MED FILL — LIOTHYRONINE SODIUM 5 MCG T: 5 | 30 days supply | Qty: 60 | Fill #3

## 2017-05-23 ENCOUNTER — Other Ambulatory Visit: Payer: Self-pay | Admitting: Pharmacist

## 2017-05-23 ENCOUNTER — Encounter: Payer: Self-pay | Admitting: Pharmacist

## 2017-05-23 VITALS — BP 126/75 | HR 91 | Wt 166.4 lb

## 2017-05-23 DIAGNOSIS — E1165 Type 2 diabetes mellitus with hyperglycemia: Secondary | ICD-10-CM

## 2017-05-23 DIAGNOSIS — Z794 Long term (current) use of insulin: Secondary | ICD-10-CM

## 2017-05-23 NOTE — Patient Outreach (Signed)
[facility]) Care Management  05/23/2017  Robin Greene Jul 29, 1956 [id number]  Subjective: Patient presents today for 3 month diabetes follow-up as part of the employer-sponsored Link to Wellness program.  Current diabetes regimen includes Lantus 25 units daily (down from 60 units in June, 2018), victoza 1.8 mg daily, metformin 1000 mg BID.  Patient also continues on daily  lisinopril and pitavastatin. Of note, patient is allergic to aspirin.  Patient endorses compliance to medications. Most recent MD follow-up was 03/13/2017 with PCP.  Patient has a pending appt for 06/08/2017.  No med changes.   Patient has lost ~ 20 lbs since June 2018 per patient report. Has been trying to lose weight in preparation for her niece's wedding. Has been using "Octavia 1" meal replacement plan to do so. No exercise given recent hernia repair. Uses pill box.   Has decreased lantus to 25 units daily with excellent glycemic control. Patient states that eventually she would like to be off of insulin.   Patient reported dietary habits: Eats 5-6 small meals/day First four small meals of the day: Octavia 1 protein shake or bar Dinner: Salad with grilled chicken Last small meal before bed: Octavia 1 protein shake or bar Snacks: none Drinks: coffee with splenda, water, unsweet tea  Patient reported exercise habits: no exercise (recent surgery), has elliptical and treadmill at home  Patient denies hypoglycemic events. Reports that she would correct with protein shake. Patient reports nocturia x1. Stable.   Patient denies pain/burning upon urination.  Patient denies neuropathy. Patient denies visual changes. Patient reports self foot exams. No issues.   Patient reported self monitored blood glucose frequency 2-3 times daily Home fasting CBG: 100-120  2 hour post-prandial/random CBG: 130-140  Objective:  Lab Results  Component Value Date   HGBA1C 7.9 03/13/2017   Vitals:   05/23/17 1054  BP:  126/75  Pulse: 91   Filed Weights   05/23/17 1054  Weight: 166 lb 6.4 oz (75.5 kg)     Lipid Panel     Component Value Date/Time   CHOL 278 (A) 08/18/2016   TRIG 295 (A) 08/18/2016   HDL 52 08/18/2016   LDLCALC 167 08/18/2016    10 year ASCVD risk: 13.8%  Last eye exam: 01/08/2017 Last dental exam: 03/08/2017   Assessment: Diabetes: uncontrolled per A1C, but improved given blood glucose reported on meter.  - A1C 8.2% which is above goal of less than 7%.  - CBGs greatly improved, Average 30 day CBG = 125 which would roughly equate to A1C <7. - Weight is down from last visit Link To Wellness   Physical Activity- inadequate, limited by recent surgery -Currently getting 0 minutes/week - Below goal of at least 150 minutes of moderate intensity exercise weekly  Nutrition- adequate, adhering to very low carbohydrate diet - Maintaining adherence to diabetic diet 100% of the time  Plan/Goals for Next Visit: - Counseled on s/sx hypoglycemia and appropriate treatment (was using protein shakes) - Diet goal: Continue the good work, start planning for what you will do when the program ends. Provided recipe ideas and protein shake/bar examples. - Exercise goal: Start using treadmill and elliptical in home for 30 mins at least 3 times a week once cleared by surgery - Provided information on CGM options for the future in case interested - Detailed goals and action plans are listed below  Next appointment to see Ulanda Edison is 09/03/2017 via telephone  Deirdre Pippins, PharmD [facility] PGY2 Pharmacy Resident [phone]  [facility]  CM Care Plan Problem One     Most Recent Value  Care Plan Problem One  Dietary indiscretion  Role Documenting the Problem One  Clinical Pharmacist  Care Plan for Problem One  Active  [facility] Long Term Goal   Over the next 90 days, patient will continue with meal replacement plan and will begin planning for meal replacement options or healthy meals once she finishes  the current program as evidenced by patient report and scale reading  [facility] Term Goal Start Date  05/23/17  Interventions for Problem One Long Term Goal  Congratulated on weight loss and adherence, encouraged to continue, provided suggestions to low-carb, high-protein shakes and bars that she can use once plan is completed to maintain weight loss.     [facility] CM Care Plan Problem Two     Most Recent Value  Care Plan Problem Two  Sedentary lifestyle  Role Documenting the Problem Two  Clinical Pharmacist  Care Plan for Problem Two  Active  Interventions for Problem Two Long Term Goal   Counseled on importance of exercise for glycemic control and CV health, provided with ideas for exercise  [facility] Long Term Goal  Over the next 90 days patient will increase activity by walking on treadmill or using elliptical for 30 minutes three times a week with an eventual goal of 150 minutes/week as evidenced by patient report and scale reading  [place] Term Goal Start Date  05/23/17

## 2017-05-31 MED FILL — UNIFINE PENTIPS 32GX5/32: 32G X 4 MM | 30 days supply | Qty: 200 | Fill #0

## 2017-05-31 MED FILL — UNIFINE PENTIPS 32GX5/32": 32G X 4 MM | 30 days supply | Qty: 200 | Fill #0

## 2017-06-08 ENCOUNTER — Telehealth: Payer: Self-pay | Admitting: Family Medicine

## 2017-06-08 ENCOUNTER — Other Ambulatory Visit: Payer: Self-pay

## 2017-06-08 DIAGNOSIS — I10 Essential (primary) hypertension: Secondary | ICD-10-CM

## 2017-06-08 MED ORDER — HYDROCHLOROTHIAZIDE 25 MG PO TABS: 25.0000 mg | ORAL_TABLET | Freq: Every day | ORAL | 0 refills | Status: DC

## 2017-06-08 MED FILL — HYDROCHLOROTHIAZIDE 25 MG T: 25 | 90 days supply | Qty: 90 | Fill #0

## 2017-06-08 NOTE — Telephone Encounter (Signed)
Sent to Dr. Opalski for review. MPulliam, CMA/RT(R)  

## 2017-06-08 NOTE — Telephone Encounter (Signed)
Remind pt she needs to be seen q 51mo--> and should be seen towards mid-to - end of Sept for next appt.  Bring in notes/ labs from [facility].

## 2017-06-08 NOTE — Telephone Encounter (Signed)
Patient called requesting refill on HCTZ - We have not filled in office at this time sent to Dr Raliegh Scarlet for review. MPulliam, CMA/RT(R)

## 2017-06-08 NOTE — Telephone Encounter (Signed)
Patient is requesting a refill of her hydrochlorothiazide sent to [facility]

## 2017-06-13 DIAGNOSIS — Z79899 Other long term (current) drug therapy: Secondary | ICD-10-CM | POA: Diagnosis not present

## 2017-06-13 DIAGNOSIS — D509 Iron deficiency anemia, unspecified: Secondary | ICD-10-CM | POA: Diagnosis not present

## 2017-06-13 DIAGNOSIS — E1165 Type 2 diabetes mellitus with hyperglycemia: Secondary | ICD-10-CM | POA: Diagnosis not present

## 2017-06-13 DIAGNOSIS — E119 Type 2 diabetes mellitus without complications: Secondary | ICD-10-CM | POA: Diagnosis not present

## 2017-06-13 DIAGNOSIS — Z794 Long term (current) use of insulin: Secondary | ICD-10-CM | POA: Diagnosis not present

## 2017-06-13 DIAGNOSIS — E039 Hypothyroidism, unspecified: Secondary | ICD-10-CM | POA: Diagnosis not present

## 2017-06-13 LAB — CBC AND DIFFERENTIAL
HCT: 36 (ref 36–46)
Hemoglobin: 11.9 — AB (ref 12.0–16.0)
PLATELETS: 354 (ref 150–399)
WBC: 5.9

## 2017-06-14 MED FILL — SYNTHROID 112 MCG TABLET: 112 | 30 days supply | Qty: 30 | Fill #0

## 2017-06-21 MED FILL — LIOTHYRONINE SODIUM 5 MCG T: 5 | 30 days supply | Qty: 60 | Fill #4

## 2017-06-21 MED FILL — LISINOPRIL 40 MG TAB: 40 | 90 days supply | Qty: 90 | Fill #2

## 2017-07-02 MED FILL — FLUTICASONE PROP 50 MCG SPR: 50 | 30 days supply | Qty: 16 | Fill #4

## 2017-07-18 ENCOUNTER — Encounter: Payer: Self-pay | Admitting: Family Medicine

## 2017-07-18 ENCOUNTER — Ambulatory Visit (INDEPENDENT_AMBULATORY_CARE_PROVIDER_SITE_OTHER): Payer: 59 | Admitting: Family Medicine

## 2017-07-18 VITALS — BP 135/85 | HR 84 | Ht 66.5 in | Wt 166.2 lb

## 2017-07-18 DIAGNOSIS — E1169 Type 2 diabetes mellitus with other specified complication: Secondary | ICD-10-CM

## 2017-07-18 DIAGNOSIS — F430 Acute stress reaction: Secondary | ICD-10-CM | POA: Diagnosis not present

## 2017-07-18 DIAGNOSIS — E039 Hypothyroidism, unspecified: Secondary | ICD-10-CM | POA: Diagnosis not present

## 2017-07-18 DIAGNOSIS — I152 Hypertension secondary to endocrine disorders: Secondary | ICD-10-CM

## 2017-07-18 DIAGNOSIS — D509 Iron deficiency anemia, unspecified: Secondary | ICD-10-CM

## 2017-07-18 DIAGNOSIS — E785 Hyperlipidemia, unspecified: Secondary | ICD-10-CM

## 2017-07-18 DIAGNOSIS — Z780 Asymptomatic menopausal state: Secondary | ICD-10-CM | POA: Diagnosis not present

## 2017-07-18 DIAGNOSIS — I10 Essential (primary) hypertension: Secondary | ICD-10-CM

## 2017-07-18 DIAGNOSIS — E669 Obesity, unspecified: Secondary | ICD-10-CM

## 2017-07-18 DIAGNOSIS — E119 Type 2 diabetes mellitus without complications: Secondary | ICD-10-CM | POA: Diagnosis not present

## 2017-07-18 DIAGNOSIS — Z9119 Patient's noncompliance with other medical treatment and regimen: Secondary | ICD-10-CM | POA: Diagnosis not present

## 2017-07-18 DIAGNOSIS — Z01419 Encounter for gynecological examination (general) (routine) without abnormal findings: Secondary | ICD-10-CM | POA: Diagnosis not present

## 2017-07-18 DIAGNOSIS — Z91199 Patient's noncompliance with other medical treatment and regimen due to unspecified reason: Secondary | ICD-10-CM

## 2017-07-18 DIAGNOSIS — Z6828 Body mass index (BMI) 28.0-28.9, adult: Secondary | ICD-10-CM | POA: Diagnosis not present

## 2017-07-18 DIAGNOSIS — E1159 Type 2 diabetes mellitus with other circulatory complications: Secondary | ICD-10-CM | POA: Diagnosis not present

## 2017-07-18 LAB — POCT GLYCOSYLATED HEMOGLOBIN (HGB A1C): HEMOGLOBIN A1C: 5.9

## 2017-07-18 MED ORDER — FERROUS SULFATE 325 (65 FE) MG PO TBEC: 325.0000 mg | DELAYED_RELEASE_TABLET | Freq: Two times a day (BID) | ORAL | 1 refills | Status: DC

## 2017-07-18 MED FILL — metFORMIN HCL 1000 MG TABS: 1000 | 90 days supply | Qty: 180 | Fill #1

## 2017-07-18 MED FILL — LIOTHYRONINE SODIUM 5 MCG T: 5 | 30 days supply | Qty: 60 | Fill #5

## 2017-07-18 NOTE — Patient Instructions (Addendum)
It is not  about a specific weight number, it is about decreasing your risk for disease so and normal BMI less than 25 would be 154 pounds in a 5 foot 6 person.  Patient will go off her Lantus today since A1c is 5.9 in very well controlled.   Also go off your Victoza. Only medicine you'll be on is your metformin.  Also please monitor your blood pressure as you see that come down if it stays consistently under 130/80 you can please half your lisinopril from 40 mg daily to 20 mg daily.  Since she changed the dose, Dr. Buddy Duty change the dose, of her Synthroid on August 29, we will recheck all labs 6 weeks after this change.  Orders and patient will come in near future for these.

## 2017-07-18 NOTE — Progress Notes (Signed)
Impression and Recommendations:    1. Type 2 diabetes mellitus without complication, unspecified whether long term insulin use ([place])   2. Hyperlipidemia associated with type 2 diabetes mellitus ([place])   3. Hypertension associated with diabetes ([place])   4. Acute reaction to situational stress   5. Hypothyroidism, unspecified type   6. Noncompliance with treatment regimen   7. Obesity, Class I, BMI 30-34.9   8. Postmenopausal   9. Iron deficiency anemia, unspecified iron deficiency anemia type    1. Type 2 diabetes mellitus without complication, unspecified whether long term insulin use ([place]) - Go off your Victoza and Lantus\insulin !!   - Continue metformin for now - monitor BS's at home regulalry  2. Hyperlipidemia associated with type 2 diabetes mellitus ([place]) - Continue medications we will check fasting lipid profile near future - We'll check CMP\LFTs  3. Hypertension associated with diabetes ([place]) - Blood pressure well controlled at home in the 120s over 70s on a regular basis. - If remains at goal, told patient to have her lisinopril from 40 mg daily down to 20 mg daily.   - Continue hydrochlorothiazide.  4. Acute reaction to situational stress - Doing great, no change in meds  5. Hypothyroidism, unspecified type - Synthroid changed by endocrinologist end of August. - We will recheck TSH and T4 in 6 weeks after change in meds. - We'll modify treatment as needed  6. Noncompliance with treatment regimen -  Doing really well.  Congratulated patient  7. Obesity, Class I, BMI 30-34.9 - Almost 30 pound weight loss.  Fantastic. - Goal BMI less than 25 would be 154 pounds or less.   It is not  about a specific weight number, it is about decreasing your risk for disease so and normal BMI less than 25 would be 154 pounds in a 5 foot 6 person.  Patient will go off her Lantus today since A1c is 5.9 in very well controlled.   Also go off your Victoza. Only medicine you'll be  on is your metformin.  Also please monitor your blood pressure as you see that come down if it stays consistently under 130/80 you can please half your lisinopril from 40 mg daily to 20 mg daily.  Since she changed the dose, Dr. Buddy Duty change the dose, of her Synthroid on August 29, we will recheck all labs 6 weeks after this change.  Orders and patient will come in near future for these.    Education and routine counseling performed. Handouts provided.  New Prescriptions   FERROUS SULFATE 325 (65 FE) MG EC TABLET    Take 1 tablet (325 mg total) by mouth 2 (two) times daily.    Meds ordered this encounter  Medications  . levothyroxine (SYNTHROID, LEVOTHROID) 112 MCG tablet    Sig: Take 112 mcg by mouth daily before breakfast.  . Cholecalciferol (VITAMIN D) 2000 units tablet    Sig: Take 2,000 Units by mouth daily.  Marland Kitchen ALPRAZolam (XANAX) 0.25 MG tablet    Sig: Take 0.25 mg by mouth 3 (three) times daily as needed for anxiety.  . ferrous sulfate 325 (65 FE) MG EC tablet    Sig: Take 1 tablet (325 mg total) by mouth 2 (two) times daily.    Dispense:  180 tablet    Refill:  1    Modified Medications   No medications on file    Discontinued Medications   FERROUS SULFATE 325 (65 FE) MG TABLET  Take 325 mg by mouth daily.    LIOTHYRONINE (CYTOMEL) 5 MCG TABLET    Take 5 mcg by mouth 2 (two) times daily.   SYNTHROID 137 MCG TABLET    Take 1 tablet by mouth daily.     Orders Placed This Encounter  Procedures  . Microalbumin / creatinine urine ratio  . CBC With Differential  . Comprehensive metabolic panel  . Hemoglobin A1c  . Lipid panel  . VITAMIN D 25 Hydroxy (Vit-D Deficiency, Fractures)  . TSH  . T4, free  . POCT glycosylated hemoglobin (Hb A1C)    Return for 1.46mo or so- reck A1c.TSh, T4  The patient was counseled, risk factors were discussed, anticipatory guidance given.  Gross side effects, risk and benefits, and alternatives of medications discussed with patient.   Patient is aware that all medications have potential side effects and we are unable to predict every side effect or drug-drug interaction that may occur.  Expresses verbal understanding and consents to current therapy plan and treatment regimen.  Please see AVS handed out to patient at the end of our visit for further patient instructions/ counseling done pertaining to today's office visit.    Note: This document was prepared using Dragon voice recognition software and may include unintentional dictation errors.     Subjective:    Chief Complaint  Patient presents with  . Follow-up     Robin Greene is a 61 y.o. female who presents to [facility] today for Diabetes Management.     Obesity:  Optvia diet for wt loss--> patient is a 30 pound weight loss since last seen. Patient started June 1 with her sister on a new weight loss program.  Patient is walking approximately 30 minutes daily and steps at work.  Umbilical hernia repair- recently done by [facility]- July 16th.   HTN-  120's/ 70's.     DM HPI:   Being txed by Dr Buddy Duty of Endo- last seen end Aug--> decreased her Synthroid as well as her Lantus which he is wanting her to get off completely.  -  She has been working on diet and exercise for diabetes  - Last A1c in 03/13/2017 was 7.9.  This was obtained during an acute care office visit.  Pt is currently maintained on the following medications for diabetes:   see med list today Medication compliance - good  Home glucose readings range not checking   Denies polyuria/polydipsia. Denies hypo/ hyperglycemia symptoms - She denies new onset of: chest pain, exercise intolerance, shortness of breath, dizziness, visual changes, headache, lower extremity swelling or claudication.   Last diabetic eye exam was No results found for: [id number]  Foot exam- UTD  Last A1C in the office was:  Lab Results  Component Value Date   HGBA1C 5.9 07/18/2017    HGBA1C 7.9 03/13/2017   HGBA1C 8.4 08/18/2016    Lab Results  Component Value Date   MICROALBUR <0.7 08/18/2016   [place] 167 08/18/2016   CREATININE 0.76 04/19/2017   Problem  Hypertension Associated With Diabetes ([facility])  Hyperlipidemia Associated With Type 2 Diabetes Mellitus ([facility])  Acute Reaction to Situational Stress  Noncompliance With Treatment Regimen  Postmenopausal   Last 3 blood pressure readings in our office are as follows: BP Readings from Last 3 Encounters:  07/18/17 135/85  05/23/17 126/75  04/30/17 119/62      Patient Care Team    Relationship Specialty Notifications Start End  Mellody Dance, DO PCP -  General Family Medicine  09/27/16   Ronald Lobo, MD Consulting Physician Gastroenterology  09/27/16   Delrae Rend, MD Consulting Physician Endocrinology  09/27/16    Comment: Roseanne Kaufman, MD Consulting Physician Dermatology  09/27/16   Arvella Nigh, MD Consulting Physician Obstetrics and Gynecology  09/27/16   Vevelyn Royals, MD Consulting Physician Ophthalmology  09/27/16   Charlton Haws, [facility] Management Pharmacist Admissions 05/23/17      Patient Active Problem List   Diagnosis Date Noted  . Hypertension associated with diabetes ([place]) 09/27/2016    Priority: High  . Hyperlipidemia associated with type 2 diabetes mellitus ([place]) 09/27/2016    Priority: High  . Diabetes mellitus ([place]) 02/07/2013    Priority: High  . Acute reaction to situational stress 10/30/2016    Priority: Medium  . Noncompliance with treatment regimen 10/18/2016    Priority: Medium  . Hypothyroidism 09/27/2016    Priority: Medium  . Obesity, Class I, BMI 30-34.9 09/27/2016    Priority: Medium  . Vitamin D deficiency 09/27/2016    Priority: Low  . HSV-1 infection 09/27/2016    Priority: Low  . Anemia 09/27/2016    Priority: Low  . Environmental and seasonal allergies 09/27/2016    Priority: Low  . Postmenopausal  07/18/2017  . Umbilical hernia without obstruction and without gangrene 03/13/2017     Past Medical History:  Diagnosis Date  . Anemia   . Arthritis   . Complication of anesthesia   . Diabetes mellitus    dx 2010  . GERD (gastroesophageal reflux disease)    occasional   will take OTC  . HSV-1 infection   . Hypertension   . PONV (postoperative nausea and vomiting)   . Vitamin D deficiency      Past Surgical History:  Procedure Laterality Date  . ABDOMINAL HYSTERECTOMY    . EYE SURGERY    . INSERTION OF MESH N/A 04/30/2017   Procedure: INSERTION OF MESH;  Surgeon: Jackolyn Confer, MD;  Location: [facility];  Service: General;  Laterality: N/A;  . OVARY SURGERY    . REFRACTIVE SURGERY    . UMBILICAL HERNIA REPAIR N/A 04/30/2017   Procedure: UMBILICAL HERNIA REPAIR WITH MESH;  Surgeon: Jackolyn Confer, MD;  Location: [facility] OR;  Service: General;  Laterality: N/A;     Family History  Problem Relation Age of Onset  . Sjogren's syndrome Mother   . Hypertension Father   . Hyperlipidemia Father   . Healthy Sister      History  Drug Use No  ,  History  Alcohol Use  . Yes    Comment: occasionally  ,  History  Smoking Status  . Never Smoker  Smokeless Tobacco  . Never Used  ,    Current Outpatient Prescriptions on File Prior to Visit  Medication Sig Dispense Refill  . cetirizine (ZYRTEC) 10 MG tablet Take 10 mg by mouth daily.    . cyanocobalamin 2000 MCG tablet Take 2,000 mcg by mouth daily.    Marland Kitchen FOLIC ACID PO Take 1 tablet by mouth daily.    Marland Kitchen glucose blood test strip Inject 1 each into the skin as needed. Use as instructed    . hydrochlorothiazide (HYDRODIURIL) 25 MG tablet Take 1 tablet (25 mg total) by mouth daily. 90 tablet 0  . insulin glargine (LANTUS) 100 UNIT/ML injection Inject 25 Units into the skin at bedtime.     . liraglutide (VICTOZA) 18 MG/3ML SOPN Inject 1.8 mg into  the skin.    Marland Kitchen lisinopril (PRINIVIL,ZESTRIL) 40 MG tablet Take 40 mg by mouth daily.     . metformin (FORTAMET) 1000 MG (OSM) 24 hr tablet Take 1,000 mg by mouth 2 (two) times daily.    . metroNIDAZOLE (METROGEL) 1 % gel Apply 1 application topically daily as needed (rosacea).     . Omega-3 Fatty Acids (FISH OIL) 1000 MG CAPS Take 1,000 mg by mouth every other day.     . Pitavastatin Calcium 1 MG TABS Take 1 tablet by mouth daily.    . valACYclovir (VALTREX) 1000 MG tablet Take 500 mg by mouth 2 (two) times daily as needed (fever blisters). 1/2 tablet twice daily for 5 days as needed for flares      No current facility-administered medications on file prior to visit.      Allergies  Allergen Reactions  . Asa [Aspirin] Anaphylaxis  . Crestor [Rosuvastatin Calcium] Other (See Comments)    Abnormal LFTs  . Claritin [Loratadine] Other (See Comments)    Fainted twice   . Invokana [Canagliflozin] Other (See Comments)    Genital yeast infections  . Jardiance [Empagliflozin] Other (See Comments)    Genital yeast infection  . Lipitor [Atorvastatin] Other (See Comments)    Muscle aches   . Simvastatin Other (See Comments)    Muscle aches     Review of Systems:   General:  Denies fever, chills Optho/Auditory:   Denies visual changes, blurred vision Respiratory:   Denies SOB, cough, wheeze, DIB  Cardiovascular:   Denies chest pain, palpitations, painful respirations Gastrointestinal:   Denies nausea, vomiting, diarrhea.  Endocrine:     Denies new hot or cold intolerance Musculoskeletal:  Denies joint swelling, gait issues, or new unexplained myalgias/ arthralgias Skin:  Denies rash, suspicious lesions  Neurological:    Denies dizziness, unexplained weakness, numbness  Psychiatric/Behavioral:   Denies mood changes    Objective:     Blood pressure 135/85, pulse 84, height 5' 6.5" (1.689 m), weight 166 lb 3.2 oz (75.4 kg).  Body mass index is 26.42 kg/m.  General: Well Developed, well nourished, and in no acute distress.  HEENT: Normocephalic, atraumatic, pupils  equal round reactive to light, neck supple, No carotid bruits, no JVD Skin: Warm and dry, cap RF less 2 sec Cardiac: Regular rate and rhythm, S1, S2 WNL's, no murmurs rubs or gallops Respiratory: ECTA B/L, Not using accessory muscles, speaking in full sentences. NeuroM-Sk: Ambulates w/o assistance, moves ext * 4 w/o difficulty, sensation grossly intact.  Ext: scant edema b/l lower ext Psych: No HI/SI, judgement and insight good, Euthymic mood. Full Affect.

## 2017-07-23 ENCOUNTER — Encounter: Payer: Self-pay | Admitting: Family Medicine

## 2017-07-26 ENCOUNTER — Other Ambulatory Visit: Payer: 59

## 2017-07-26 DIAGNOSIS — E1159 Type 2 diabetes mellitus with other circulatory complications: Secondary | ICD-10-CM

## 2017-07-26 DIAGNOSIS — F430 Acute stress reaction: Secondary | ICD-10-CM

## 2017-07-26 DIAGNOSIS — E119 Type 2 diabetes mellitus without complications: Secondary | ICD-10-CM

## 2017-07-26 DIAGNOSIS — E785 Hyperlipidemia, unspecified: Secondary | ICD-10-CM

## 2017-07-26 DIAGNOSIS — Z114 Encounter for screening for human immunodeficiency virus [HIV]: Secondary | ICD-10-CM

## 2017-07-26 DIAGNOSIS — Z9119 Patient's noncompliance with other medical treatment and regimen: Secondary | ICD-10-CM | POA: Diagnosis not present

## 2017-07-26 DIAGNOSIS — E1169 Type 2 diabetes mellitus with other specified complication: Secondary | ICD-10-CM

## 2017-07-26 DIAGNOSIS — Z91199 Patient's noncompliance with other medical treatment and regimen due to unspecified reason: Secondary | ICD-10-CM

## 2017-07-26 DIAGNOSIS — I10 Essential (primary) hypertension: Secondary | ICD-10-CM

## 2017-07-26 DIAGNOSIS — E039 Hypothyroidism, unspecified: Secondary | ICD-10-CM | POA: Diagnosis not present

## 2017-07-26 DIAGNOSIS — E669 Obesity, unspecified: Secondary | ICD-10-CM | POA: Diagnosis not present

## 2017-07-26 DIAGNOSIS — Z1159 Encounter for screening for other viral diseases: Secondary | ICD-10-CM

## 2017-07-26 DIAGNOSIS — I152 Hypertension secondary to endocrine disorders: Secondary | ICD-10-CM

## 2017-07-26 DIAGNOSIS — Z780 Asymptomatic menopausal state: Secondary | ICD-10-CM

## 2017-07-26 NOTE — Addendum Note (Signed)
Addended by: Lanier Prude D on: 07/26/2017 10:37 AM   Modules accepted: Orders

## 2017-07-27 LAB — HIV ANTIBODY (ROUTINE TESTING W REFLEX): HIV Screen 4th Generation wRfx: NONREACTIVE

## 2017-07-27 LAB — COMPREHENSIVE METABOLIC PANEL
A/G RATIO: 1.6 (ref 1.2–2.2)
ALBUMIN: 4.2 g/dL (ref 3.6–4.8)
ALT: 31 IU/L (ref 0–32)
AST: 20 IU/L (ref 0–40)
Alkaline Phosphatase: 92 IU/L (ref 39–117)
BILIRUBIN TOTAL: 0.3 mg/dL (ref 0.0–1.2)
BUN/Creatinine Ratio: 26 (ref 12–28)
BUN: 19 mg/dL (ref 8–27)
CALCIUM: 10.1 mg/dL (ref 8.7–10.3)
CHLORIDE: 102 mmol/L (ref 96–106)
CO2: 22 mmol/L (ref 20–29)
Creatinine, Ser: 0.74 mg/dL (ref 0.57–1.00)
GFR, EST AFRICAN AMERICAN: 100 mL/min/{1.73_m2} (ref >59)
GFR, EST NON AFRICAN AMERICAN: 86 mL/min/{1.73_m2} (ref >59)
GLOBULIN, TOTAL: 2.6 g/dL (ref 1.5–4.5)
Glucose: 130 mg/dL — ABNORMAL HIGH (ref 65–99)
POTASSIUM: 4.5 mmol/L (ref 3.5–5.2)
SODIUM: 140 mmol/L (ref 134–144)
TOTAL PROTEIN: 6.8 g/dL (ref 6.0–8.5)

## 2017-07-27 LAB — CBC WITH DIFFERENTIAL
BASOS: 1 %
Basophils Absolute: 0.1 10*3/uL (ref 0.0–0.2)
EOS (ABSOLUTE): 0.3 10*3/uL (ref 0.0–0.4)
Eos: 6 %
HEMOGLOBIN: 11.9 g/dL (ref 11.1–15.9)
Hematocrit: 35 % (ref 34.0–46.6)
IMMATURE GRANS (ABS): 0 10*3/uL (ref 0.0–0.1)
Immature Granulocytes: 0 %
LYMPHS: 25 %
Lymphocytes Absolute: 1.4 10*3/uL (ref 0.7–3.1)
MCH: 26.7 pg (ref 26.6–33.0)
MCHC: 34 g/dL (ref 31.5–35.7)
MCV: 79 fL (ref 79–97)
Monocytes Absolute: 0.4 10*3/uL (ref 0.1–0.9)
Monocytes: 7 %
NEUTROS ABS: 3.5 10*3/uL (ref 1.4–7.0)
NEUTROS PCT: 61 %
RBC: 4.46 x10E6/uL (ref 3.77–5.28)
RDW: 17.8 % — ABNORMAL HIGH (ref 12.3–15.4)
WBC: 5.7 10*3/uL (ref 3.4–10.8)

## 2017-07-27 LAB — HEMOGLOBIN A1C
Est. average glucose Bld gHb Est-mCnc: 123 mg/dL
Hgb A1c MFr Bld: 5.9 % — ABNORMAL HIGH (ref 4.8–5.6)

## 2017-07-27 LAB — LIPID PANEL
CHOLESTEROL TOTAL: 194 mg/dL (ref 100–199)
Chol/HDL Ratio: 4 ratio (ref 0.0–4.4)
HDL: 49 mg/dL (ref >39)
LDL CALC: 122 mg/dL — AB (ref 0–99)
Triglycerides: 113 mg/dL (ref 0–149)
VLDL CHOLESTEROL CAL: 23 mg/dL (ref 5–40)

## 2017-07-27 LAB — TSH: TSH: 0.231 u[IU]/mL — AB (ref 0.450–4.500)

## 2017-07-27 LAB — HEPATITIS C ANTIBODY: Hep C Virus Ab: 0.2 s/co ratio (ref 0.0–0.9)

## 2017-07-27 LAB — T4, FREE: FREE T4: 1.19 ng/dL (ref 0.82–1.77)

## 2017-07-27 LAB — VITAMIN D 25 HYDROXY (VIT D DEFICIENCY, FRACTURES): Vit D, 25-Hydroxy: 38.4 ng/mL (ref 30.0–100.0)

## 2017-07-31 DIAGNOSIS — J343 Hypertrophy of nasal turbinates: Secondary | ICD-10-CM | POA: Diagnosis not present

## 2017-07-31 DIAGNOSIS — J310 Chronic rhinitis: Secondary | ICD-10-CM | POA: Diagnosis not present

## 2017-07-31 DIAGNOSIS — J342 Deviated nasal septum: Secondary | ICD-10-CM | POA: Diagnosis not present

## 2017-08-03 MED FILL — VICTOZA 18 MG/3 ML INJECT P: 18 | 30 days supply | Qty: 9 | Fill #2

## 2017-08-03 MED FILL — SYNTHROID 112 MCG TABLET: 112 | 30 days supply | Qty: 30 | Fill #1

## 2017-08-21 MED FILL — FLUTICASONE PROP 50 MCG SPR: 50 | 30 days supply | Qty: 16 | Fill #5

## 2017-08-21 MED FILL — LIOTHYRONINE SODIUM 5 MCG T: 5 | 30 days supply | Qty: 60 | Fill #6

## 2017-08-22 DIAGNOSIS — L820 Inflamed seborrheic keratosis: Secondary | ICD-10-CM | POA: Diagnosis not present

## 2017-08-23 ENCOUNTER — Telehealth: Payer: Self-pay

## 2017-08-23 NOTE — Telephone Encounter (Signed)
Received Epic notification that pt has not read MyChart message regarding results.    Dear Ms. Feliciana Rossetti,   Dr. Raliegh Scarlet asked that I inform you that most all labs that we recently obtained are within normal limits- they all continue to improve with your recent weight loss and lifestyle changes.  Your A1c is currently 5.9, and your LDL and triglycerides have significantly improved as well!  This is great!    Your TSH is low normal but T4 is within normal limits. Dr. Raliegh Scarlet recommends that, unless you are having symptoms of hyperthyroidism, we will recheck this in 4 months when we recheck your other labs- namely A1c    Please follow-up per your discussion at last office visit.   If there are any further questions or concerns, please call the office for an appointment.   Wishing you well,  Kenney Houseman, [place] User Last Read On  Alfonso Patten Not Read      Discussed with pt verbally via telephone.  Pt expressed understanding and is agreeable.  Charyl Bigger, CMA

## 2017-09-03 ENCOUNTER — Other Ambulatory Visit: Payer: Self-pay

## 2017-09-03 NOTE — Patient Outreach (Signed)
Spoke to patient via phone advising that disease self-management services will be transitioned from the [facility] program to either [facility] or [facility] in 2019. Ensured that she has completed the health risk assessment on the [email or web address] website. Also advised that a letter will be mailed to the home residence with details of this transition.            Will close case to [facility] diabetes program   Beverlee Nims L. Kyung Rudd, PharmD, [place] PGY1 Pharmacy Resident

## 2017-09-12 ENCOUNTER — Ambulatory Visit: Payer: 59 | Admitting: Family Medicine

## 2017-09-19 ENCOUNTER — Encounter: Payer: Self-pay | Admitting: Family Medicine

## 2017-09-19 ENCOUNTER — Ambulatory Visit (INDEPENDENT_AMBULATORY_CARE_PROVIDER_SITE_OTHER): Payer: 59 | Admitting: Family Medicine

## 2017-09-19 VITALS — BP 148/79 | HR 60 | Ht 66.5 in | Wt 162.0 lb

## 2017-09-19 DIAGNOSIS — F430 Acute stress reaction: Secondary | ICD-10-CM | POA: Diagnosis not present

## 2017-09-19 DIAGNOSIS — E119 Type 2 diabetes mellitus without complications: Secondary | ICD-10-CM | POA: Diagnosis not present

## 2017-09-19 DIAGNOSIS — E039 Hypothyroidism, unspecified: Secondary | ICD-10-CM

## 2017-09-19 DIAGNOSIS — Z23 Encounter for immunization: Secondary | ICD-10-CM | POA: Diagnosis not present

## 2017-09-19 DIAGNOSIS — E1159 Type 2 diabetes mellitus with other circulatory complications: Secondary | ICD-10-CM

## 2017-09-19 DIAGNOSIS — I10 Essential (primary) hypertension: Secondary | ICD-10-CM

## 2017-09-19 DIAGNOSIS — I152 Hypertension secondary to endocrine disorders: Secondary | ICD-10-CM

## 2017-09-19 MED ORDER — PNEUMOCOCCAL 13-VAL CONJ VACC IM SUSP
0.5000 mL | INTRAMUSCULAR | Status: AC
  Administered 2017-09-19: 0.5 mL via INTRAMUSCULAR

## 2017-09-19 NOTE — Progress Notes (Signed)
Impression and Recommendations:    1. Type 2 diabetes mellitus without complication, unspecified whether long term insulin use ([place])   2. Need for pneumococcal vaccination     Gross side effects, risk and benefits, and alternatives of medications and treatment plan in general discussed with patient.  Patient is aware that all medications have potential side effects and we are unable to predict every side effect or drug-drug interaction that may occur.   Patient will call with any questions prior to using medication if they have concerns.  Expresses verbal understanding and consents to current therapy and treatment regimen.  No barriers to understanding were identified.  Red flag symptoms and signs discussed in detail.  Patient expressed understanding regarding what to do in case of emergency\urgent symptoms  Please see AVS handed out to patient at the end of our visit for further patient instructions/ counseling done pertaining to today's office visit.   Return for 4-36mo, sooner if BP remians up.    Note: This note was prepared with assistance of Dragon voice recognition software. Occasional wrong-word or sound-a-like substitutions may have occurred due to the inherent limitations of voice recognition software.  Manvir Prabhu 12:02 PM --------------------------------------------------------------------------------------------------------------------------------------------------------------------------------------------------------------------------------------------    Subjective:    CC:  Chief Complaint  Patient presents with  . Follow-up    HPI: Robin Greene is a 61 y.o. female who presents to [place] [facility] today for issues as discussed below.   bp - last ov told to decrease her blood pressure medicines last office visit.  She was told to half her lisinopril from 40 down to 20 which she did she also opted to decrease the hydrochlorothiazide  thiazide in half since her home blood pressures are well controlled in the 120s over 70s and right around there.  -Today patient's blood pressure is up because she has had a little bit of a head cold and taking over-the-counter decongestants.  - pt remains Asx   - DM:   Went off her lantus and victoza last OV.   Still on metformin.  She been checking her blood sugars and they have been running very well controlled except for around Thanksgiving where her fastings were around the 140s.  Otherwise they have been running in the 100s-120.   Still doing optavia.     - HYPOTHYROIDISM:  Dr Buddy Duty - managing her thyroid-he has been slowly decreasing her but also monitoring her more recently she has been feeling a little bit fatigued with the most recent dose drop.  She is due to recheck it on December 21.  She will let us know if she needs help with that in any manner    Wt Readings from Last 3 Encounters:  01/01/18 166 lb 4.8 oz (75.4 kg)  09/19/17 162 lb (73.5 kg)  07/18/17 166 lb 3.2 oz (75.4 kg)   BP Readings from Last 3 Encounters:  01/01/18 139/82  09/19/17 (!) 148/79  07/18/17 135/85   Pulse Readings from Last 3 Encounters:  01/01/18 66  09/19/17 60  07/18/17 84   BMI Readings from Last 3 Encounters:  01/01/18 27.25 kg/m  09/19/17 25.76 kg/m  07/18/17 26.42 kg/m     Patient Care Team    Relationship Specialty Notifications Start End  Mellody Dance, DO PCP - General Family Medicine  09/27/16   Ronald Lobo, MD Consulting Physician Gastroenterology  09/27/16   Delrae Rend, MD Consulting Physician Endocrinology  09/27/16    Comment:  Roseanne Kaufman, MD Consulting Physician Dermatology  09/27/16   Arvella Nigh, MD Consulting Physician Obstetrics and Gynecology  09/27/16   Vevelyn Royals, MD Consulting Physician Ophthalmology  09/27/16      Patient Active Problem List   Diagnosis Date Noted  . Hypertension associated with diabetes ([place]) 09/27/2016     Priority: High  . Hyperlipidemia associated with type 2 diabetes mellitus ([place]) 09/27/2016    Priority: High  . Diabetes mellitus ([place]) 02/07/2013    Priority: High  . Acute reaction to situational stress 10/30/2016    Priority: Medium  . Noncompliance with treatment regimen 10/18/2016    Priority: Medium  . Hypothyroidism 09/27/2016    Priority: Medium  . Obesity, Class I, BMI 30-34.9 09/27/2016    Priority: Medium  . Vitamin D deficiency 09/27/2016    Priority: Low  . HSV-1 infection 09/27/2016    Priority: Low  . Anemia 09/27/2016    Priority: Low  . Environmental and seasonal allergies 09/27/2016    Priority: Low  . Vulvovaginitis 01/01/2018  . Postmenopausal 07/18/2017  . Umbilical hernia without obstruction and without gangrene 03/13/2017    Past Medical history, Surgical history, Family history, Social history, Allergies and Medications have been entered into the medical record, reviewed and changed as needed.    Current Meds  Medication Sig  . ALPRAZolam (XANAX) 0.25 MG tablet Take 0.25 mg by mouth 3 (three) times daily as needed for anxiety.  . cetirizine (ZYRTEC) 10 MG tablet Take 10 mg by mouth daily.  . cyanocobalamin 2000 MCG tablet Take 2,000 mcg by mouth daily.  . ferrous sulfate 325 (65 FE) MG EC tablet Take 1 tablet (325 mg total) by mouth 2 (two) times daily.  Marland Kitchen FOLIC ACID PO Take 1 tablet by mouth daily.  Marland Kitchen glucose blood test strip Inject 1 each into the skin as needed. Use as instructed  . metformin (FORTAMET) 1000 MG (OSM) 24 hr tablet Take 1,000 mg by mouth 2 (two) times daily.  . valACYclovir (VALTREX) 1000 MG tablet Take 500 mg by mouth 2 (two) times daily as needed (fever blisters). 1/2 tablet twice daily for 5 days as needed for flares   . [DISCONTINUED] Cholecalciferol (VITAMIN D) 2000 units tablet Take 5,000 Units by mouth daily.  . [DISCONTINUED] hydrochlorothiazide (HYDRODIURIL) 25 MG tablet Take 1 tablet (25 mg total) by mouth daily.  .  [DISCONTINUED] levothyroxine (SYNTHROID, LEVOTHROID) 100 MCG tablet Take 100 mcg by mouth daily before breakfast.   . [DISCONTINUED] liothyronine (CYTOMEL) 5 MCG tablet Take 5 mcg by mouth daily.  . [DISCONTINUED] lisinopril (PRINIVIL,ZESTRIL) 40 MG tablet Take 40 mg by mouth daily.  . [DISCONTINUED] Omega-3 Fatty Acids (FISH OIL) 1000 MG CAPS Take 1,000 mg by mouth every other day.     Allergies:  Allergies  Allergen Reactions  . Asa [Aspirin] Anaphylaxis  . Crestor [Rosuvastatin Calcium] Other (See Comments)    Abnormal LFTs  . Claritin [Loratadine] Other (See Comments)    Fainted twice   . Invokana [Canagliflozin] Other (See Comments)    Genital yeast infections  . Jardiance [Empagliflozin] Other (See Comments)    Genital yeast infection  . Lipitor [Atorvastatin] Other (See Comments)    Muscle aches   . Simvastatin Other (See Comments)    Muscle aches     Review of Systems: General:   Denies fever, chills, unexplained weight loss.  Optho/Auditory:   Denies visual changes, blurred vision/LOV Respiratory:   Denies wheeze, DOE more than baseline levels.  Cardiovascular:   Denies chest pain, palpitations, new onset peripheral edema  Gastrointestinal:   Denies nausea, vomiting, diarrhea, abd pain.  Genitourinary: Denies dysuria, freq/ urgency, flank pain or discharge from genitals.  Endocrine:     Denies hot or cold intolerance, polyuria, polydipsia. Musculoskeletal:   Denies unexplained myalgias, joint swelling, unexplained arthralgias, gait problems.  Skin:  Denies new onset rash, suspicious lesions Neurological:     Denies dizziness, unexplained weakness, numbness  Psychiatric/Behavioral:   Denies mood changes, suicidal or homicidal ideations, hallucinations    Objective:   Blood pressure (!) 148/79, pulse 60, height 5' 6.5" (1.689 m), weight 162 lb (73.5 kg). Body mass index is 25.76 kg/m. General:  Well Developed, well nourished, appropriate for stated age.  Neuro:   Alert and oriented,  extra-ocular muscles intact  HEENT:  Normocephalic, atraumatic, neck supple, no carotid bruits appreciated  Skin:  no gross rash, warm, pink. Cardiac:  RRR, S1 S2 Respiratory:  ECTA B/L and A/P, Not using accessory muscles, speaking in full sentences- unlabored. Vascular:  Ext warm, no cyanosis apprec.; cap RF less 2 sec. Psych:  No HI/SI, judgement and insight good, Euthymic mood. Full Affect.

## 2017-09-19 NOTE — Patient Instructions (Addendum)
After you meet with Dr. Buddy Duty about your upcoming labs and thyroid function, please make sure you let us know what those labs showed.  Were happy to help you out in this regard in the future anyway we can.  Please make sure you are checking her blood pressure at home as this transient increase is likely from your cold medicines.  If you develop any fever chills one-sided face pain or this cold gets worse and continues for 10-14 days please let me know because we you may need antibiotics.   Ice that left knee for 15-20 minutes 3-4 times per day.  Avoid deep bending, squatting and stairs.  Please try not to wear heels.  If this does not improve over the next 2 weeks to 3 weeks with conservative management, please let us know.  No heat

## 2017-09-25 MED FILL — LIOTHYRONINE SODIUM 5 MCG T: 5 | 30 days supply | Qty: 60 | Fill #0

## 2017-09-26 ENCOUNTER — Other Ambulatory Visit: Payer: Self-pay | Admitting: Family Medicine

## 2017-09-26 ENCOUNTER — Encounter: Payer: Self-pay | Admitting: Family Medicine

## 2017-09-26 DIAGNOSIS — I10 Essential (primary) hypertension: Secondary | ICD-10-CM

## 2017-09-26 MED ORDER — HYDROCHLOROTHIAZIDE 25 MG PO TABS: 25.0000 mg | ORAL_TABLET | Freq: Every day | ORAL | 0 refills | Status: DC

## 2017-09-26 MED FILL — HYDROCHLOROTHIAZIDE 25 MG T: 25 | 90 days supply | Qty: 90 | Fill #0

## 2017-10-05 DIAGNOSIS — E039 Hypothyroidism, unspecified: Secondary | ICD-10-CM | POA: Diagnosis not present

## 2017-10-10 ENCOUNTER — Other Ambulatory Visit (HOSPITAL_COMMUNITY): Payer: Self-pay | Admitting: Family Medicine

## 2017-10-10 DIAGNOSIS — S8001XA Contusion of right knee, initial encounter: Secondary | ICD-10-CM

## 2017-10-10 DIAGNOSIS — M25562 Pain in left knee: Secondary | ICD-10-CM | POA: Diagnosis not present

## 2017-10-10 DIAGNOSIS — M25461 Effusion, right knee: Secondary | ICD-10-CM

## 2017-10-10 DIAGNOSIS — M25561 Pain in right knee: Secondary | ICD-10-CM

## 2017-10-11 ENCOUNTER — Other Ambulatory Visit (HOSPITAL_COMMUNITY): Payer: Self-pay | Admitting: Family Medicine

## 2017-10-11 DIAGNOSIS — M25562 Pain in left knee: Secondary | ICD-10-CM

## 2017-10-11 DIAGNOSIS — S8001XA Contusion of right knee, initial encounter: Secondary | ICD-10-CM

## 2017-10-11 DIAGNOSIS — M25462 Effusion, left knee: Secondary | ICD-10-CM

## 2017-10-12 ENCOUNTER — Ambulatory Visit (HOSPITAL_COMMUNITY)
Admission: RE | Admit: 2017-10-12 | Discharge: 2017-10-12 | Disposition: A | Discharge status: Home / Self Care | Payer: 59 | Source: Ambulatory Visit | Attending: Family Medicine | Admitting: Family Medicine

## 2017-10-12 DIAGNOSIS — S82145A Nondisplaced bicondylar fracture of left tibia, initial encounter for closed fracture: Secondary | ICD-10-CM | POA: Insufficient documentation

## 2017-10-12 DIAGNOSIS — X58XXXA Exposure to other specified factors, initial encounter: Secondary | ICD-10-CM | POA: Diagnosis not present

## 2017-10-12 DIAGNOSIS — M25561 Pain in right knee: Secondary | ICD-10-CM | POA: Diagnosis not present

## 2017-10-12 DIAGNOSIS — M8580 Other specified disorders of bone density and structure, unspecified site: Secondary | ICD-10-CM | POA: Diagnosis not present

## 2017-10-12 DIAGNOSIS — M25462 Effusion, left knee: Secondary | ICD-10-CM

## 2017-10-12 DIAGNOSIS — S8001XA Contusion of right knee, initial encounter: Secondary | ICD-10-CM

## 2017-10-12 DIAGNOSIS — S8992XA Unspecified injury of left lower leg, initial encounter: Secondary | ICD-10-CM | POA: Diagnosis not present

## 2017-10-12 DIAGNOSIS — M25562 Pain in left knee: Secondary | ICD-10-CM

## 2017-10-15 ENCOUNTER — Encounter: Payer: Self-pay | Admitting: Family Medicine

## 2017-10-19 MED FILL — SYNTHROID 100 MCG TABLET: 100 | 30 days supply | Qty: 30 | Fill #0

## 2017-10-19 MED FILL — metFORMIN HCL 1000 MG TABS: 1000 | 90 days supply | Qty: 180 | Fill #0

## 2017-10-26 MED FILL — LIOTHYRONINE SODIUM 5 MCG T: 5 | 30 days supply | Qty: 60 | Fill #1

## 2017-11-15 MED FILL — SYNTHROID 100 MCG TABLET: 100 | 30 days supply | Qty: 30 | Fill #1

## 2017-11-15 MED FILL — LISINOPRIL 40 MG TABLET: 40 | 90 days supply | Qty: 90 | Fill #3

## 2017-11-29 MED FILL — LIOTHYRONINE SODIUM 5 MCG T: 5 | 30 days supply | Qty: 60 | Fill #2

## 2017-12-03 ENCOUNTER — Encounter: Payer: Self-pay | Admitting: Family Medicine

## 2017-12-06 MED FILL — ALPRAZolam 0.25 MG TABS: 0.25 | 10 days supply | Qty: 30 | Fill #0

## 2017-12-19 ENCOUNTER — Other Ambulatory Visit: Payer: No Typology Code available for payment source

## 2017-12-20 ENCOUNTER — Other Ambulatory Visit: Payer: No Typology Code available for payment source

## 2017-12-21 LAB — LIPID PANEL
CHOLESTEROL TOTAL: 236 mg/dL — AB (ref 100–199)
Chol/HDL Ratio: 4.3 ratio (ref 0.0–4.4)
HDL: 55 mg/dL (ref >39)
LDL Calculated: 154 mg/dL — ABNORMAL HIGH (ref 0–99)
Triglycerides: 137 mg/dL (ref 0–149)
VLDL Cholesterol Cal: 27 mg/dL (ref 5–40)

## 2017-12-21 LAB — T4, FREE: Free T4: 0.87 ng/dL (ref 0.82–1.77)

## 2017-12-21 LAB — HEMOGLOBIN A1C
ESTIMATED AVERAGE GLUCOSE: 169 mg/dL
HEMOGLOBIN A1C: 7.5 % — AB (ref 4.8–5.6)

## 2017-12-21 LAB — TSH: TSH: 3.89 u[IU]/mL (ref 0.450–4.500)

## 2018-01-01 ENCOUNTER — Encounter: Payer: Self-pay | Admitting: Family Medicine

## 2018-01-01 ENCOUNTER — Ambulatory Visit (INDEPENDENT_AMBULATORY_CARE_PROVIDER_SITE_OTHER): Payer: No Typology Code available for payment source | Admitting: Family Medicine

## 2018-01-01 VITALS — BP 139/82 | HR 66 | Ht 65.5 in | Wt 166.3 lb

## 2018-01-01 DIAGNOSIS — E1169 Type 2 diabetes mellitus with other specified complication: Secondary | ICD-10-CM

## 2018-01-01 DIAGNOSIS — E119 Type 2 diabetes mellitus without complications: Secondary | ICD-10-CM | POA: Diagnosis not present

## 2018-01-01 DIAGNOSIS — I10 Essential (primary) hypertension: Secondary | ICD-10-CM

## 2018-01-01 DIAGNOSIS — E669 Obesity, unspecified: Secondary | ICD-10-CM

## 2018-01-01 DIAGNOSIS — I152 Hypertension secondary to endocrine disorders: Secondary | ICD-10-CM

## 2018-01-01 DIAGNOSIS — D649 Anemia, unspecified: Secondary | ICD-10-CM

## 2018-01-01 DIAGNOSIS — E039 Hypothyroidism, unspecified: Secondary | ICD-10-CM | POA: Diagnosis not present

## 2018-01-01 DIAGNOSIS — E66811 Obesity, class 1: Secondary | ICD-10-CM

## 2018-01-01 DIAGNOSIS — E785 Hyperlipidemia, unspecified: Secondary | ICD-10-CM | POA: Diagnosis not present

## 2018-01-01 DIAGNOSIS — E559 Vitamin D deficiency, unspecified: Secondary | ICD-10-CM

## 2018-01-01 DIAGNOSIS — F430 Acute stress reaction: Secondary | ICD-10-CM | POA: Diagnosis not present

## 2018-01-01 DIAGNOSIS — E1159 Type 2 diabetes mellitus with other circulatory complications: Secondary | ICD-10-CM

## 2018-01-01 DIAGNOSIS — N760 Acute vaginitis: Secondary | ICD-10-CM | POA: Diagnosis not present

## 2018-01-01 LAB — POCT UA - MICROALBUMIN
Albumin/Creatinine Ratio, Urine, POC: <30
Creatinine, POC: 10 mg/dL
Microalbumin Ur, POC: 10 mg/L

## 2018-01-01 MED ORDER — EZETIMIBE 10 MG PO TABS: 10.0000 mg | ORAL_TABLET | Freq: Every day | ORAL | 3 refills | Status: DC

## 2018-01-01 MED ORDER — LEVOTHYROXINE SODIUM 100 MCG PO TABS: 100.0000 ug | ORAL_TABLET | Freq: Every day | ORAL | 1 refills | Status: DC

## 2018-01-01 MED ORDER — TERCONAZOLE 0.4 % VA CREA: 1.0000 | TOPICAL_CREAM | Freq: Every day | VAGINAL | 1 refills | Status: DC

## 2018-01-01 MED ORDER — LIOTHYRONINE SODIUM 5 MCG PO TABS: 5.0000 ug | ORAL_TABLET | Freq: Every day | ORAL | 1 refills | Status: DC

## 2018-01-01 MED ORDER — VITAMIN D 50 MCG (2000 UT) PO TABS: 5000.0000 [IU] | ORAL_TABLET | Freq: Every day | ORAL | Status: DC

## 2018-01-01 MED ORDER — FISH OIL 1000 MG PO CAPS: ORAL_CAPSULE | ORAL | Status: DC

## 2018-01-01 MED ORDER — GLIPIZIDE ER 5 MG PO TB24: 5.0000 mg | ORAL_TABLET | Freq: Every day | ORAL | 1 refills | Status: DC

## 2018-01-01 MED FILL — glipiZIDE XL 5 MG TB24: 5 | 90 days supply | Qty: 90 | Fill #0

## 2018-01-01 MED FILL — EZETIMIBE 10 MG TABLET: 10 | 90 days supply | Qty: 90 | Fill #0

## 2018-01-01 MED FILL — TERCONAZOLE 0.4% VAG CREAM: 0.4 | 7 days supply | Qty: 45 | Fill #0

## 2018-01-01 MED FILL — LIOTHYRONINE SODIUM 5 MCG T: 5 | 90 days supply | Qty: 90 | Fill #0

## 2018-01-01 NOTE — Progress Notes (Signed)
Assessment and plan:  1. Type 2 diabetes mellitus without complication, unspecified whether long term insulin use ([place])   2. Hypertension associated with diabetes ([place])   3. Hyperlipidemia associated with type 2 diabetes mellitus ([facility])   4. Obesity, Class I, BMI 30-34.9   5. Hypothyroidism, unspecified type   6. Acute reaction to situational stress   7. Vitamin D deficiency   8. Anemia, unspecified type   9. Vulvovaginitis- commonly due to poorly controlled BS     1. DM2- -Start glucotrol, to which pt agreed. Explained to pt this is short term as she is willing to work on lifestyle modification in addition to starting this medication to better control her blood sugars.  -A1c from 12-20-17 was 7.5, increased from 07-26-17 where A1c was 5.9. Pt has not been able to work on diet or exercise for about 8 weeks due to a leg fracture.  -check your blood sugars both fasting and 2 hour post prandial. Keep a log and bring this into next OV.  -Keep A1c ideally <6.5.   2. Hypertension- -BP slightly elevated in office today, stable upon recheck. Tolerating meds well without complications. Asymptomatic. Continue meds as listed below.   3. Hyperlipidemia- -Start zetia. -recommended start lovaza but pt declined. She will continue OTC supplements. -continue OTC fish oil supplements. Take 2 of these BID. -Goal: HLD >60. Use healthy oils when cooking. -Dietary and exercise guidelines discussed with patient. Recommended pt to reduce intake of saturated, trans fats and fatty carbohydrates. Handouts provided.  -AHA dietary and exercise guidelines discussed.  -Recheck in 6 weeks.   4. Obesity class I -recommended pt to continue losing weight.   5. Hypothyroidism- will continue same medicines. Labs were WNL.  -refill synthroid -refill cytomel 6. Acute reaction to situational stress- Pt believes this is why her blood sugars and BP have  been elevated recently.  7. Vitamin D deficiency- Tolerating supplements well without complication. Continue supplements.  8. Anemia- per pt, she has stopped taking her iron supplements. Pt asymptomatic at this time. Recommended to continue taking them as listed below.   9. Vulvovaginitis  -refill terconazole.   Education and routine counseling performed. Handouts provided.  Orders Placed This Encounter  Procedures  . ALT  . POCT UA - Microalbumin  . HM Diabetes Foot Exam    Meds ordered this encounter  Medications  . ezetimibe (ZETIA) 10 MG tablet    Sig: Take 1 tablet (10 mg total) by mouth daily.    Dispense:  90 tablet    Refill:  3  . glipiZIDE (GLUCOTROL XL) 5 MG 24 hr tablet    Sig: Take 1 tablet (5 mg total) by mouth daily with breakfast.    Dispense:  90 tablet    Refill:  1  . levothyroxine (SYNTHROID, LEVOTHROID) 100 MCG tablet    Sig: Take 1 tablet (100 mcg total) by mouth daily before breakfast.    Dispense:  90 tablet    Refill:  1  . DISCONTD: liothyronine (CYTOMEL) 5 MCG tablet    Sig: Take 1 tablet (5 mcg total) by mouth daily.    Dispense:  90 tablet    Refill:  1  . Omega-3 Fatty Acids (FISH OIL) 1000 MG CAPS    Sig: Take 1200 mg fish oil 2 tablets twice daily  . terconazole (TERAZOL 7) 0.4 % vaginal cream    Sig: Place 1 applicator vaginally at bedtime.    Dispense:  45 g  Refill:  1  . Cholecalciferol (VITAMIN D) 2000 units tablet    Sig: Take 2.5 tablets (5,000 Units total) by mouth daily.     Return for diabetes follow up every 3 mo; 6 wks for ALT lab  only.   Anticipatory guidance and routine counseling done re: condition, txmnt options and need for follow up. All questions of patient's were answered.   Gross side effects, risk and benefits, and alternatives of medications discussed with patient.  Patient is aware that all medications have potential side effects and we are unable to predict every sideeffect or drug-drug interaction that  may occur.  Expresses verbal understanding and consents to current therapy plan and treatment regiment.  Please see AVS handed out to patient at the end of our visit for additional patient instructions/ counseling done pertaining to today's office visit.  Note: This document was prepared using Dragon voice recognition software and may include unintentional dictation errors.    This document serves as a record of services personally performed by Mellody Dance, DO. It was created on her behalf by Mayer Masker, a trained medical scribe. The creation of this record is based on the scribe's personal observations and the provider's statements to them.   I have reviewed the above medical documentation for accuracy and completeness and I concur.  Mellody Dance 01/07/18 5:23 PM   ----------------------------------------------------------------------------------------------------------------------  Subjective:   CC:   Robin Greene is a 62 y.o. female who presents to [place] at [facility] today for review and discussion of recent bloodwork that was done.  1. All recent blood work that we ordered was reviewed with patient today.  Patient was counseled on all abnormalities and we discussed dietary and lifestyle changes that could help those values (also medications when appropriate).  Extensive health counseling performed and all patient's concerns/ questions were addressed.   She states she fractured her leg on Christmas eve, which has not allowed her to exercise or diet much at all. She has gained 5 lbs since then.   Anemia Pt has stopped taking her iron supplements because they made her feel constipated. She denies any symptoms.  DM HPI:  -  She has not been working on diet and exercise for diabetes due to a leg fracture. She states her diet has been poor since then. She is now able to walk on a flat surface about 0.5 miles. She states going downstairs hurts her leg, but  going up stairs is okay. She states she was eating small meals every 2 hours or so.    Pt is currently maintained on the following medications for diabetes:   see med list today  Medication compliance - yes- BID. She was on victoza but her insurance does not pay for this anymore.   Home glucose readings range: 160-180, but it has been lower in the past, about 100s.   She states she knows when her blood sugars are high because she gets vaginal itchiness as a result. She is prone to yeast infections.    Denies polyuria/polydipsia.  Denies hypo/ hyperglycemia symptoms  Last diabetic eye exam was No results found for: HMDIABEYEEXA  Foot exam- UTD  CHOL HPI:   -  She  is currently managed with:  See med list from today  Txmnt compliance- pt has not been taking any medications. She has been taking her fish oil supplements QD. She states her father has tried niacin before with good results, but she has allergies to  aspirin.   Patient reports very little compliance with low chol/ saturated and trans fat diet.  No exercise   No other s-e  Last lipid panel as follows:  Lab Results  Component Value Date   CHOL 236 (H) 12/20/2017   HDL 55 12/20/2017   LDLCALC 154 (H) 12/20/2017   TRIG 137 12/20/2017   CHOLHDL 4.3 12/20/2017    Hepatic Function Latest Ref Rng & Units 07/26/2017 08/18/2016  Total Protein 6.0 - 8.5 g/dL 6.8 -  Albumin 3.6 - 4.8 g/dL 4.2 -  AST 0 - 40 IU/L 20 32  ALT 0 - 32 IU/L 31 44(A)  Alk Phosphatase 39 - 117 IU/L 92 84  Total Bilirubin 0.0 - 1.2 mg/dL 0.3 -   Last A1C in the office was:  Lab Results  Component Value Date   HGBA1C 7.5 (H) 12/20/2017   HGBA1C 5.9 (H) 07/26/2017   HGBA1C 5.9 07/18/2017    Lab Results  Component Value Date   MICROALBUR 10 01/01/2018   LDLCALC 154 (H) 12/20/2017   CREATININE 0.74 07/26/2017    Wt Readings from Last 3 Encounters:  01/01/18 166 lb 4.8 oz (75.4 kg)  09/19/17 162 lb (73.5 kg)  07/18/17 166 lb 3.2 oz (75.4  kg)    BP Readings from Last 3 Encounters:  01/01/18 139/82  09/19/17 (!) 148/79  07/18/17 135/85     Wt Readings from Last 3 Encounters:  01/01/18 166 lb 4.8 oz (75.4 kg)  09/19/17 162 lb (73.5 kg)  07/18/17 166 lb 3.2 oz (75.4 kg)   BP Readings from Last 3 Encounters:  01/01/18 139/82  09/19/17 (!) 148/79  07/18/17 135/85   Pulse Readings from Last 3 Encounters:  01/01/18 66  09/19/17 60  07/18/17 84   BMI Readings from Last 3 Encounters:  01/01/18 27.25 kg/m  09/19/17 25.76 kg/m  07/18/17 26.42 kg/m     Patient Care Team    Relationship Specialty Notifications Start End  Mellody Dance, DO PCP - General Family Medicine  09/27/16   Ronald Lobo, MD Consulting Physician Gastroenterology  09/27/16   Delrae Rend, MD Consulting Physician Endocrinology  09/27/16    Comment: Roseanne Kaufman, MD Consulting Physician Dermatology  09/27/16   Arvella Nigh, MD Consulting Physician Obstetrics and Gynecology  09/27/16   Vevelyn Royals, MD Consulting Physician Ophthalmology  09/27/16     Full medical history updated and reviewed in the office today  Patient Active Problem List   Diagnosis Date Noted  . Hypertension associated with diabetes ([place]) 09/27/2016    Priority: High  . Hyperlipidemia associated with type 2 diabetes mellitus ([place]) 09/27/2016    Priority: High  . Diabetes mellitus ([place]) 02/07/2013    Priority: High  . Acute reaction to situational stress 10/30/2016    Priority: Medium  . Noncompliance with treatment regimen 10/18/2016    Priority: Medium  . Hypothyroidism 09/27/2016    Priority: Medium  . Obesity, Class I, BMI 30-34.9 09/27/2016    Priority: Medium  . Vitamin D deficiency 09/27/2016    Priority: Low  . HSV-1 infection 09/27/2016    Priority: Low  . Anemia 09/27/2016    Priority: Low  . Environmental and seasonal allergies 09/27/2016    Priority: Low  . Vulvovaginitis 01/01/2018  . Postmenopausal 07/18/2017  .  Umbilical hernia without obstruction and without gangrene 03/13/2017    Past Medical History:  Diagnosis Date  . Anemia   . Arthritis   . Complication of anesthesia   .  Diabetes mellitus    dx 2010  . GERD (gastroesophageal reflux disease)    occasional   will take OTC  . HSV-1 infection   . Hypertension   . PONV (postoperative nausea and vomiting)   . Vitamin D deficiency     Past Surgical History:  Procedure Laterality Date  . ABDOMINAL HYSTERECTOMY    . EYE SURGERY    . INSERTION OF MESH N/A 04/30/2017   Procedure: INSERTION OF MESH;  Surgeon: Jackolyn Confer, MD;  Location: [facility];  Service: General;  Laterality: N/A;  . OVARY SURGERY    . REFRACTIVE SURGERY    . UMBILICAL HERNIA REPAIR N/A 04/30/2017   Procedure: UMBILICAL HERNIA REPAIR WITH MESH;  Surgeon: Jackolyn Confer, MD;  Location: [facility];  Service: General;  Laterality: N/A;    Social History   Tobacco Use  . Smoking status: Never Smoker  . Smokeless tobacco: Never Used  Substance Use Topics  . Alcohol use: Yes    Comment: occasionally    Family Hx: Family History  Problem Relation Age of Onset  . Sjogren's syndrome Mother   . Hypertension Father   . Hyperlipidemia Father   . Healthy Sister      Medications: Current Outpatient Medications  Medication Sig Dispense Refill  . ALPRAZolam (XANAX) 0.25 MG tablet Take 0.25 mg by mouth 3 (three) times daily as needed for anxiety.    . cetirizine (ZYRTEC) 10 MG tablet Take 10 mg by mouth daily.    . Cholecalciferol (VITAMIN D) 2000 units tablet Take 2.5 tablets (5,000 Units total) by mouth daily.    . cyanocobalamin 2000 MCG tablet Take 2,000 mcg by mouth daily.    . ferrous sulfate 325 (65 FE) MG EC tablet Take 1 tablet (325 mg total) by mouth 2 (two) times daily. 180 tablet 1  . FOLIC ACID PO Take 1 tablet by mouth daily.    Marland Kitchen glucose blood test strip Inject 1 each into the skin as needed. Use as instructed    . hydrochlorothiazide (HYDRODIURIL) 25 MG  tablet Take 1 tablet (25 mg total) by mouth daily. 90 tablet 0  . levothyroxine (SYNTHROID, LEVOTHROID) 100 MCG tablet Take 1 tablet (100 mcg total) by mouth daily before breakfast. 90 tablet 1  . lisinopril (PRINIVIL,ZESTRIL) 40 MG tablet Take 40 mg by mouth daily.    . metformin (FORTAMET) 1000 MG (OSM) 24 hr tablet Take 1,000 mg by mouth 2 (two) times daily.    . metroNIDAZOLE (METROGEL) 1 % gel Apply 1 application topically daily as needed (rosacea).     . Omega-3 Fatty Acids (FISH OIL) 1000 MG CAPS Take 1200 mg fish oil 2 tablets twice daily    . valACYclovir (VALTREX) 1000 MG tablet Take 500 mg by mouth 2 (two) times daily as needed (fever blisters). 1/2 tablet twice daily for 5 days as needed for flares     . ezetimibe (ZETIA) 10 MG tablet Take 1 tablet (10 mg total) by mouth daily. 90 tablet 3  . glipiZIDE (GLUCOTROL XL) 5 MG 24 hr tablet Take 1 tablet (5 mg total) by mouth daily with breakfast. 90 tablet 1  . liothyronine (CYTOMEL) 5 MCG tablet Take 1 tablet (5 mcg total) by mouth 2 (two) times daily. 180 tablet 1  . terconazole (TERAZOL 7) 0.4 % vaginal cream Place 1 applicator vaginally at bedtime. 45 g 1   No current facility-administered medications for this visit.     Allergies:  Allergies  Allergen Reactions  .  Asa [Aspirin] Anaphylaxis  . Crestor [Rosuvastatin Calcium] Other (See Comments)    Abnormal LFTs  . Claritin [Loratadine] Other (See Comments)    Fainted twice   . Invokana [Canagliflozin] Other (See Comments)    Genital yeast infections  . Jardiance [Empagliflozin] Other (See Comments)    Genital yeast infection  . Lipitor [Atorvastatin] Other (See Comments)    Muscle aches   . Simvastatin Other (See Comments)    Muscle aches     Review of Systems: General:   No F/C, wt loss Pulm:   No DIB, SOB, pleuritic chest pain Card:  No CP, palpitations Abd:  No n/v/d or pain Ext:  No inc edema from baseline  Objective:  Blood pressure 139/82, pulse 66, height  5' 5.5" (1.664 m), weight 166 lb 4.8 oz (75.4 kg), SpO2 98 %. Body mass index is 27.25 kg/m. Gen:   Well NAD, A and O *3 HEENT:    [place]/AT, EOMI,  MMM Lungs:   Normal work of breathing. CTA B/L, no Wh, rhonchi Heart:   RRR, S1, S2 WNL's, no MRG Abd:   No gross distention Exts:    warm, pink,  Brisk capillary refill, warm and well perfused.  Psych:    No HI/SI, judgement and insight good, Euthymic mood. Full Affect.   Recent Results (from the past 2160 hour(s))  TSH     Status: None   Collection Time: 12/20/17  8:13 AM  Result Value Ref [place]   TSH 3.890 0.450 - 4.500 uIU/mL  Lipid panel     Status: Abnormal   Collection Time: 12/20/17  8:13 AM  Result Value Ref [place]   Cholesterol, Total 236 (H) 100 - 199 mg/dL   Triglycerides 137 0 - 149 mg/dL   HDL 55 >39 mg/dL   VLDL Cholesterol Cal 27 5 - 40 mg/dL   LDL Calculated 154 (H) 0 - 99 mg/dL   Chol/HDL Ratio 4.3 0.0 - 4.4 ratio    Comment:                                   T. Chol/HDL Ratio                                             Men  Women                               1/2 Avg.Risk  3.4    3.3                                   Avg.Risk  5.0    4.4                                2X Avg.Risk  9.6    7.1                                3X Avg.Risk 23.4   11.0   T4, free     Status: None   Collection Time: 12/20/17  8:13 AM  Result Value  Ref Range   Free T4 0.87 0.82 - 1.77 ng/dL  Hemoglobin A1c     Status: Abnormal   Collection Time: 12/20/17  8:13 AM  Result Value Ref Range   Hgb A1c MFr Bld 7.5 (H) 4.8 - 5.6 %    Comment:          Prediabetes: 5.7 - 6.4          Diabetes: >6.4          Glycemic control for adults with diabetes: <7.0    Est. average glucose Bld gHb Est-mCnc 169 mg/dL  POCT UA - Microalbumin     Status: Normal   Collection Time: 01/01/18  4:51 PM  Result Value Ref [place]   Microalbumin Ur, POC 10 mg/L   Creatinine, POC 10 mg/dL   Albumin/Creatinine Ratio, Urine, POC <30

## 2018-01-02 ENCOUNTER — Other Ambulatory Visit: Payer: Self-pay

## 2018-01-02 ENCOUNTER — Telehealth: Payer: Self-pay | Admitting: Family Medicine

## 2018-01-02 MED FILL — SYNTHROID 100 MCG TABLET: 100 | 90 days supply | Qty: 90 | Fill #0

## 2018-01-02 NOTE — Telephone Encounter (Signed)
Patient requested medical assistant call her at [phone]. --glh

## 2018-01-02 NOTE — Telephone Encounter (Signed)
Patient called states that cytomel was sent in yesterday for 5 mg once a day and patient states that medication should have been sent in for 5 mg bid.  Sent to Dr. Raliegh Scarlet to review. MPulliam, CMA/RT(R)'

## 2018-01-02 NOTE — Telephone Encounter (Signed)
I refilled medicines that were documented in the chart exactly how she was taking them.     Please confirm this sig with her pharmacy prior to dispensing it, also please confirm the other thyroid medicine be called and is accurate as well.  Thank you.    -  It is okay to refill it for 90d, 1 RF, once we confirm exactly how patient has been taking it.

## 2018-01-02 NOTE — Telephone Encounter (Signed)
Called patient - medication sent in for incorrect dose.  Please see note in chart sent to Dr. Raliegh Scarlet. MPulliam, CMA/RT(R)

## 2018-01-03 MED ORDER — LIOTHYRONINE SODIUM 5 MCG PO TABS: 5.0000 ug | ORAL_TABLET | Freq: Two times a day (BID) | ORAL | 1 refills | Status: DC

## 2018-01-03 NOTE — Telephone Encounter (Signed)
Dosage verified with the pharmacy and updated in to patient's chart. MPulliam, CMA/RT(R)

## 2018-01-16 ENCOUNTER — Encounter: Payer: Self-pay | Admitting: Family Medicine

## 2018-01-16 MED FILL — metFORMIN HCL 1000 MG TABS: 1000 | 90 days supply | Qty: 180 | Fill #1

## 2018-01-31 ENCOUNTER — Other Ambulatory Visit: Payer: No Typology Code available for payment source

## 2018-01-31 DIAGNOSIS — E1169 Type 2 diabetes mellitus with other specified complication: Secondary | ICD-10-CM

## 2018-01-31 DIAGNOSIS — E785 Hyperlipidemia, unspecified: Principal | ICD-10-CM

## 2018-02-01 LAB — ALT: ALT: 33 IU/L — AB (ref 0–32)

## 2018-02-07 ENCOUNTER — Other Ambulatory Visit: Payer: Self-pay | Admitting: Family Medicine

## 2018-02-07 DIAGNOSIS — I10 Essential (primary) hypertension: Secondary | ICD-10-CM

## 2018-02-07 MED FILL — HYDROCHLOROTHIAZIDE 25 MG T: 25 | 90 days supply | Qty: 90 | Fill #0

## 2018-02-13 MED FILL — LIOTHYRONINE SODIUM 5 MCG T: 5 | 90 days supply | Qty: 180 | Fill #0

## 2018-02-18 ENCOUNTER — Ambulatory Visit: Payer: 59 | Admitting: Family Medicine

## 2018-02-19 ENCOUNTER — Telehealth: Payer: Self-pay | Admitting: Family Medicine

## 2018-02-19 NOTE — Telephone Encounter (Signed)
Called and notified patient and she states that she will try this and will call to follow up if she fills that the cough med is needed. MPulliam, CMA/RT(R)

## 2018-02-19 NOTE — Telephone Encounter (Signed)
Patient called states has a barking cough from hay fever / allergies, request medical assistant/ provider call her with advise--- Pt ask if an Rx be called in or does she need OV for Rx meds.  --Pls call her at [phone].  --Forwarding message to medical assistant for review & pt contact.  ---glh

## 2018-02-19 NOTE — Telephone Encounter (Signed)
If that is the only symptom she has is a cough, we can call in a prescription cough medicine such as Tussionex.  However this will only cover up the cough and prevent her from coughing a lot not fix the problem that is causing cough.  The likely reason is postnasal drip and increased congestion from the allergy season.  I recommend she do Nettie pot\sinus rinse twice daily followed by 1 spray Flonase each nostril twice daily and continue to take Zyrtec.  She may want to switch from Zyrtec to Allegra D just for a little bit but monitor her blood pressure and then once the congestion has subsided go back to the plain Allegra.

## 2018-02-19 NOTE — Telephone Encounter (Signed)
Called patient, she states that she has a barky cough that is productive at times (colored).  Patient denies any other symptoms.  Patient is currently on Zyrtec and Flonase daily.  Please advise if there is anything else the patient can do or if see needs to be seen in the office.  MPulliam, CMA/RT(R)

## 2018-02-21 ENCOUNTER — Encounter: Payer: Self-pay | Admitting: Family Medicine

## 2018-02-21 ENCOUNTER — Other Ambulatory Visit: Payer: Self-pay

## 2018-02-21 MED ORDER — HYDROCOD POLST-CPM POLST ER 10-8 MG/5ML PO SUER: 5.0000 mL | Freq: Two times a day (BID) | ORAL | 0 refills | Status: DC | PRN

## 2018-02-21 MED ORDER — LISINOPRIL 40 MG PO TABS: 40.0000 mg | ORAL_TABLET | Freq: Every day | ORAL | 1 refills | Status: DC

## 2018-02-21 MED FILL — HYDROCODONE-CHLORPHEN ER SU: 10-8 | 12 days supply | Qty: 120 | Fill #0

## 2018-02-21 MED FILL — LISINOPRIL 40 MG TABLET: 40 | 90 days supply | Qty: 90 | Fill #0

## 2018-02-21 NOTE — Telephone Encounter (Signed)
Already done

## 2018-02-21 NOTE — Telephone Encounter (Signed)
Patient request refill on Lisinopril.  Reviewed chart patient was last seen on 01/01/18 and has next follow up on 04/04/18.  Medication last filled by a pervious provider. Request sent to Dr. Raliegh Scarlet to review. MPulliam, CMA/RT(R)

## 2018-03-26 MED FILL — EZETIMIBE 10 MG TABLET: 10 | 90 days supply | Qty: 90 | Fill #1

## 2018-03-30 IMAGING — MG 2D DIGITAL DIAGNOSTIC BILATERAL MAMMOGRAM WITH CAD AND ADJUNCT T
8 of 14 series · 8 of 30 positions shown · non-contrast
Comparison: Previous exam(s).

CLINICAL DATA: Patient for short-term follow-up left breast
calcifications.

EXAM:
2D DIGITAL DIAGNOSTIC BILATERAL MAMMOGRAM WITH CAD AND ADJUNCT TOMO

[L ML]
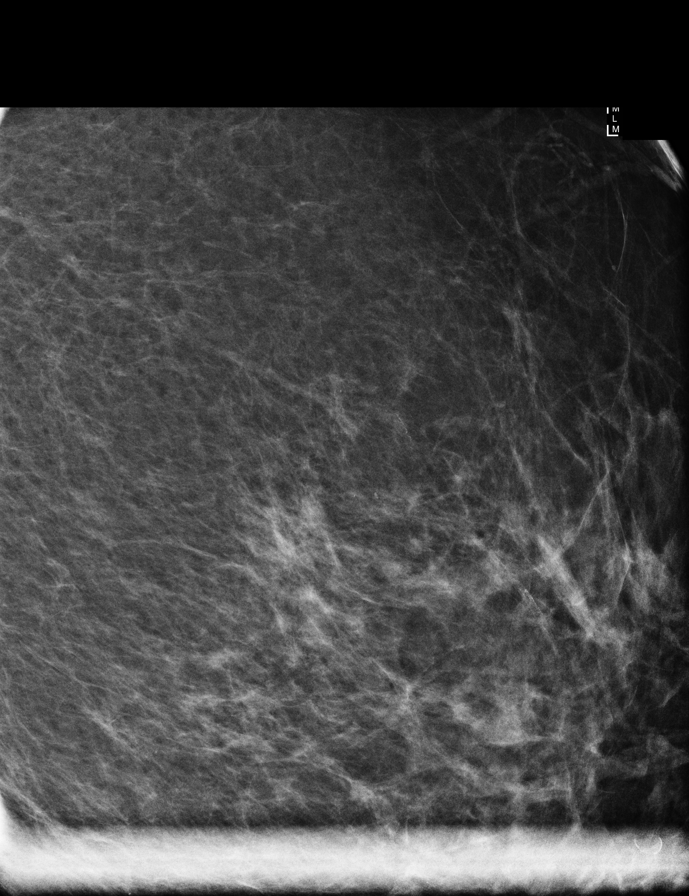

[L CC (1 of 2)]
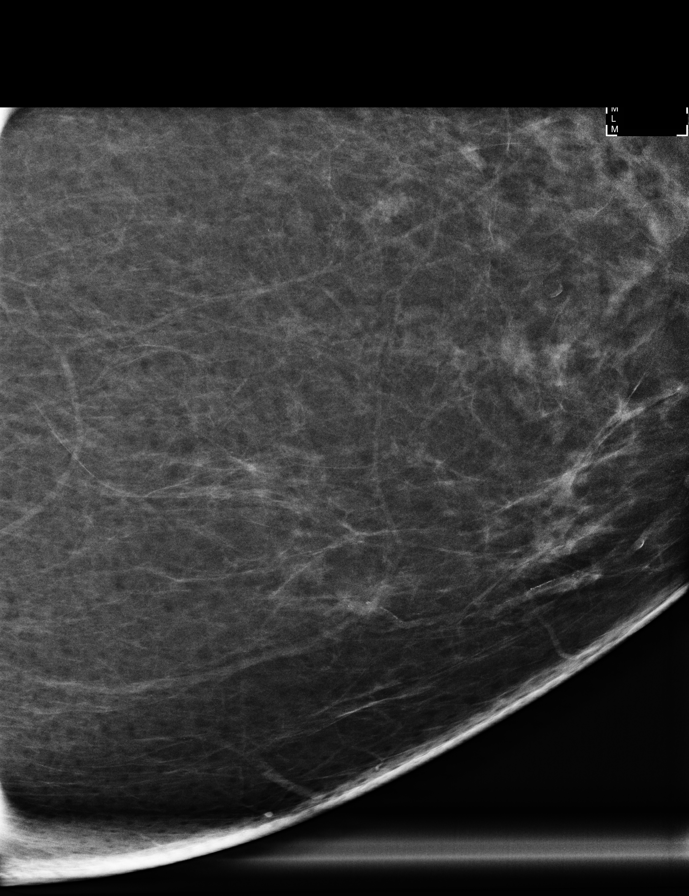

[L MLO]
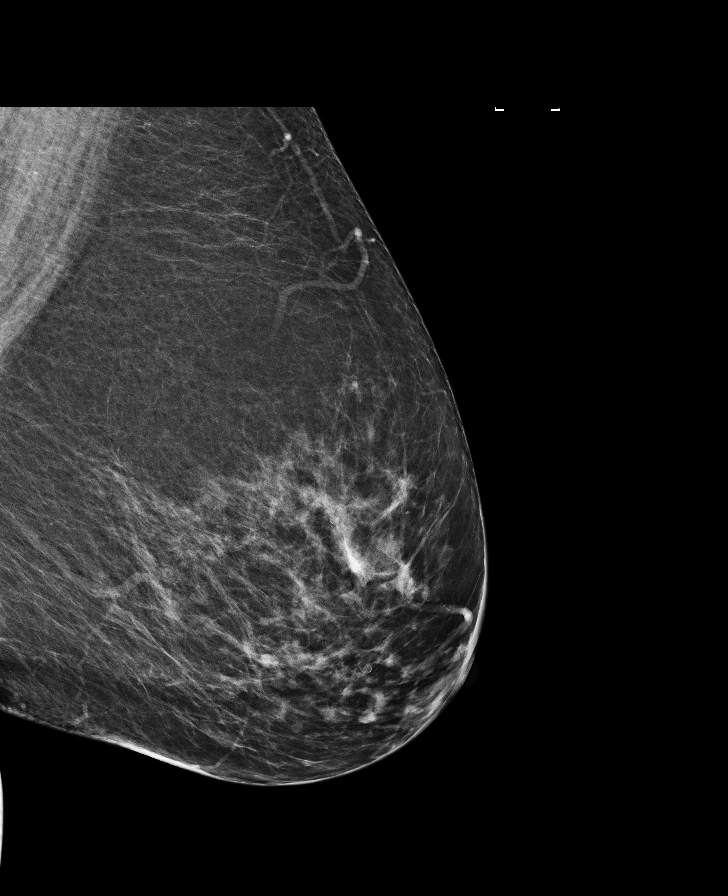

[L CC synth-2D]
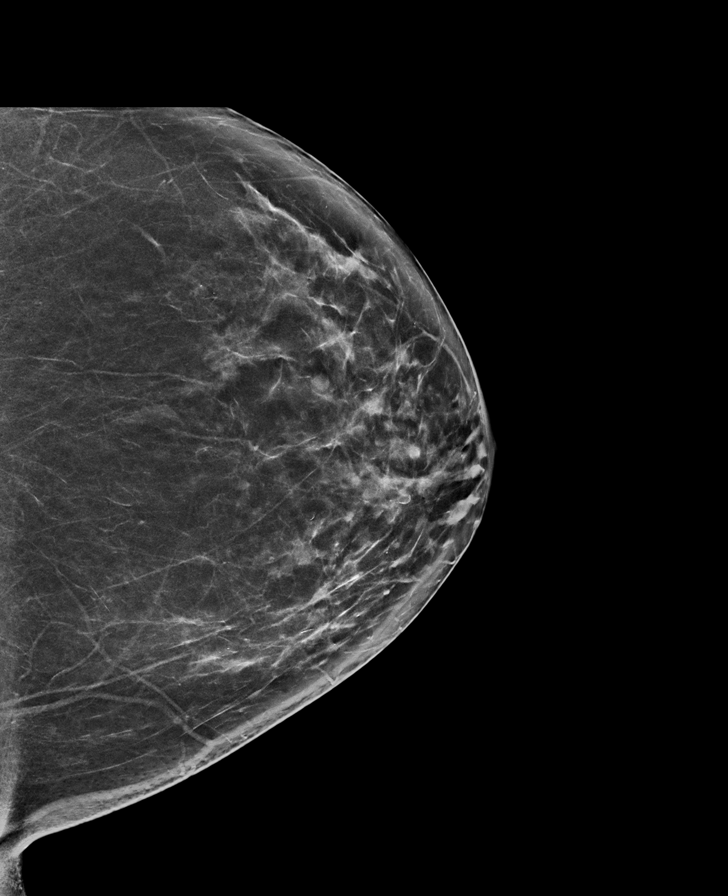

[L MLO synth-2D]
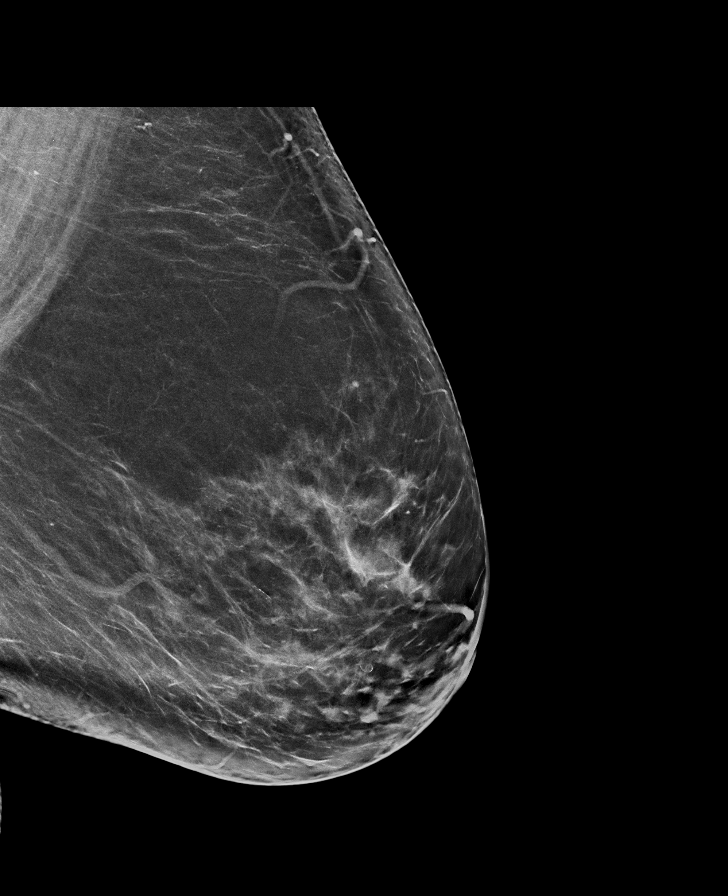

[R MLO]
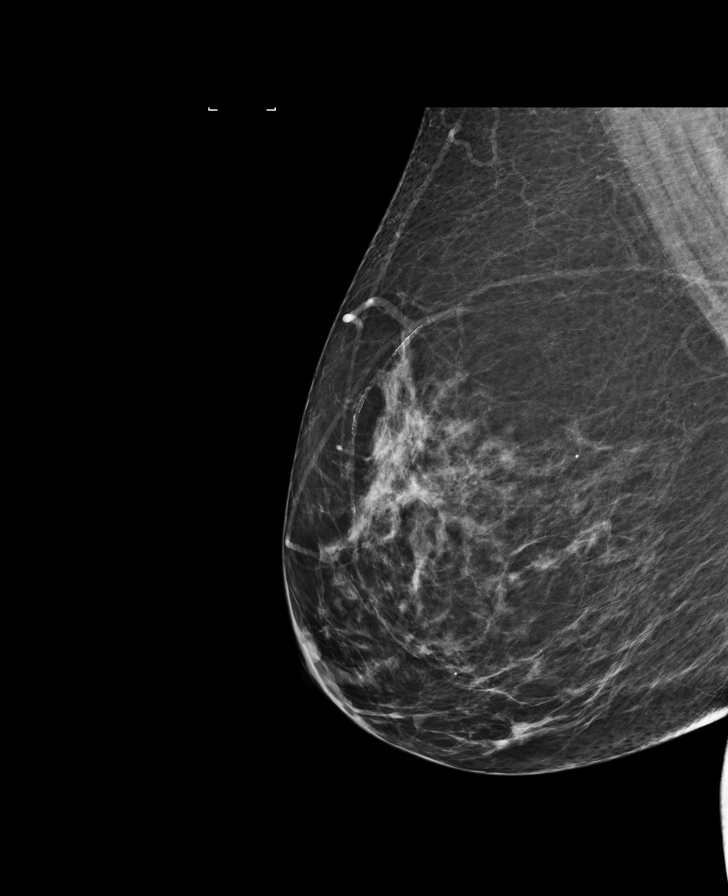

[L CC (2 of 2)]
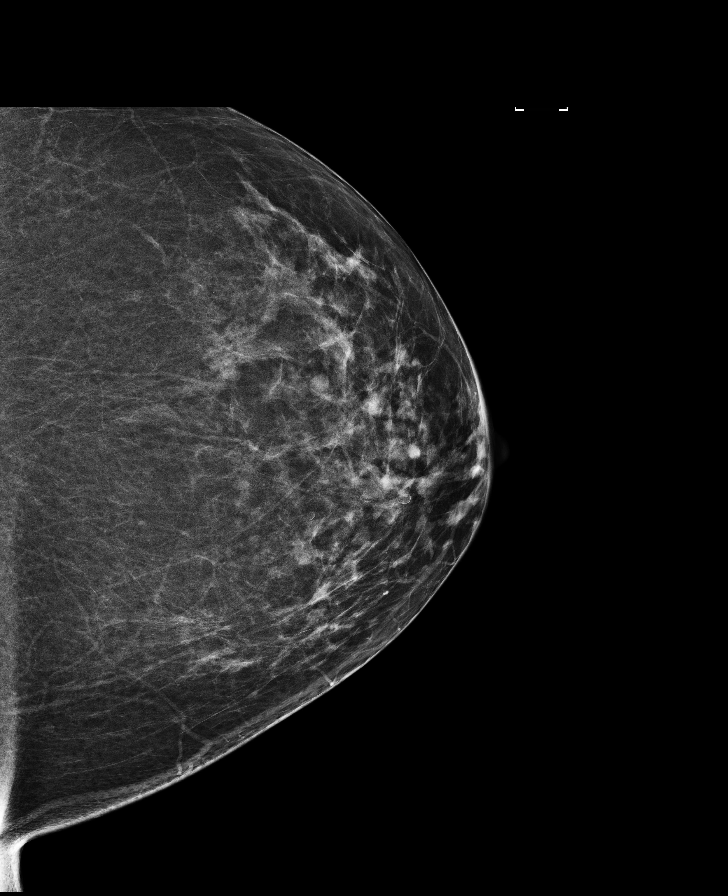

[R CC]
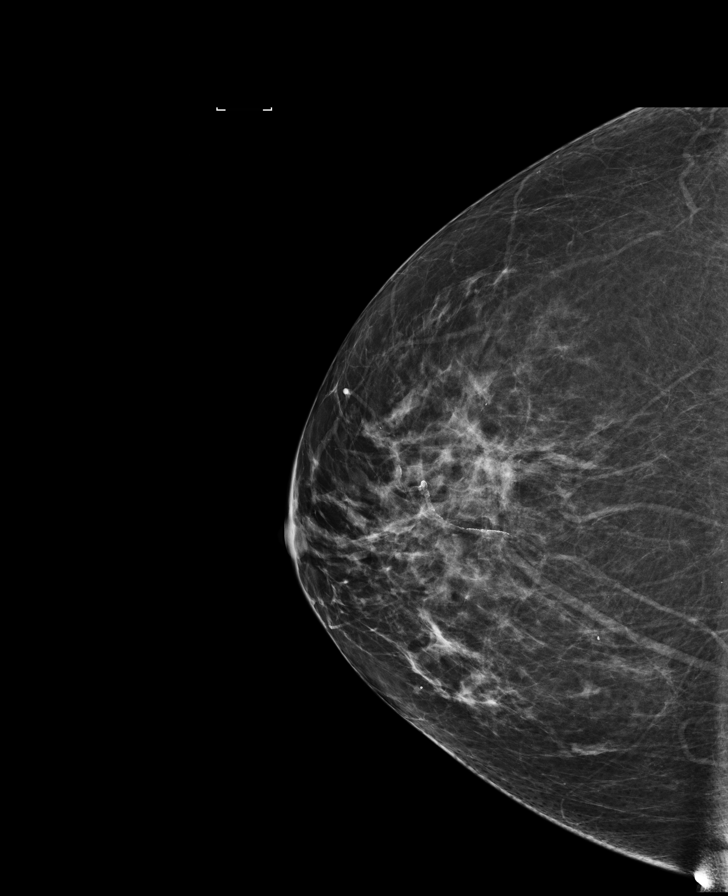

[8 of 30 positions shown; findings below may reference images not displayed]

ACR Breast Density Category b: There are scattered areas of
fibroglandular density.
FINDINGS: No concerning masses, calcifications or areas of architectural
distortion identified within either breast. Slight interval
decreased conspicuity of calcifications within the medial left
breast. Given appearance over time, these are most compatible with a
benign etiology.

Mammographic images were processed with CAD.
IMPRESSION: No mammographic evidence for malignancy.

RECOMMENDATION:
Screening mammogram in one year.(Code:[id number])

I have discussed the findings and recommendations with the patient.
Results were also provided in writing at the conclusion of the
visit. If applicable, a reminder letter will be sent to the patient
regarding the next appointment.

BI-RADS CATEGORY  2: Benign.

## 2018-04-04 ENCOUNTER — Ambulatory Visit (INDEPENDENT_AMBULATORY_CARE_PROVIDER_SITE_OTHER): Payer: No Typology Code available for payment source | Admitting: Family Medicine

## 2018-04-04 ENCOUNTER — Encounter: Payer: Self-pay | Admitting: Family Medicine

## 2018-04-04 VITALS — BP 169/92 | HR 76 | Ht 65.5 in | Wt 169.8 lb

## 2018-04-04 DIAGNOSIS — E785 Hyperlipidemia, unspecified: Secondary | ICD-10-CM

## 2018-04-04 DIAGNOSIS — E1159 Type 2 diabetes mellitus with other circulatory complications: Secondary | ICD-10-CM

## 2018-04-04 DIAGNOSIS — H6983 Other specified disorders of Eustachian tube, bilateral: Secondary | ICD-10-CM | POA: Diagnosis not present

## 2018-04-04 DIAGNOSIS — E039 Hypothyroidism, unspecified: Secondary | ICD-10-CM

## 2018-04-04 DIAGNOSIS — M65949 Unspecified synovitis and tenosynovitis, unspecified hand: Secondary | ICD-10-CM

## 2018-04-04 DIAGNOSIS — E119 Type 2 diabetes mellitus without complications: Secondary | ICD-10-CM

## 2018-04-04 DIAGNOSIS — R5383 Other fatigue: Secondary | ICD-10-CM | POA: Diagnosis not present

## 2018-04-04 DIAGNOSIS — I152 Hypertension secondary to endocrine disorders: Secondary | ICD-10-CM

## 2018-04-04 DIAGNOSIS — M659 Synovitis and tenosynovitis, unspecified: Secondary | ICD-10-CM

## 2018-04-04 DIAGNOSIS — H6993 Unspecified Eustachian tube disorder, bilateral: Secondary | ICD-10-CM

## 2018-04-04 DIAGNOSIS — E663 Overweight: Secondary | ICD-10-CM

## 2018-04-04 DIAGNOSIS — I10 Essential (primary) hypertension: Secondary | ICD-10-CM | POA: Diagnosis not present

## 2018-04-04 DIAGNOSIS — E1169 Type 2 diabetes mellitus with other specified complication: Secondary | ICD-10-CM | POA: Diagnosis not present

## 2018-04-04 DIAGNOSIS — R42 Dizziness and giddiness: Secondary | ICD-10-CM

## 2018-04-04 LAB — POCT GLYCOSYLATED HEMOGLOBIN (HGB A1C): HbA1c, POC (controlled diabetic range): 6.6 % (ref 0.0–7.0)

## 2018-04-04 NOTE — Progress Notes (Signed)
Assessment and plan:  1. Type 2 diabetes mellitus without complication, unspecified whether long term insulin use ([place])   2. Hypertension associated with diabetes ([place])   3. Hyperlipidemia associated with type 2 diabetes mellitus ([place])   4. Overweight   5. Hypothyroidism, unspecified type   6. Fatigue, unspecified type   7. Vertigo   8. Dysfunction of both eustachian tubes   9. Tenosynovitis of finger and hand      BP (!) 169/92   Pulse 76   Ht 5' 5.5" (1.664 m)   Wt 169 lb 12.8 oz (77 kg)   SpO2 98%   BMI 27.83 kg/m    1. DM2-  -Discussed with the patient today regarding continuation of lifestyle modifications in addition to taking her prescribed medications to better control her blood sugar levels.   -A1c from today, 04/04/2018 at 6.6, decreased from 7.5 on 12/20/2017  -Patient was advised to check her blood sugars both fasting and 2 hour post prandial.   -Discussed with the patient today that ideally, we would like to have her A1c <6.5.  -Advised to continue on her medications as prescribed.   2. Hypertension- -BP elevated to 169/92 in the office today. Patient notes that she has extensive stressors of life stressors and work stressors that she contributes to her elevated blood pressure today.  -BP rechecked in the office today and recheck at 148/83, improved        -She is asymptomatic today, patient advised to continue her medications as prescribed.   3. Hyperlipidemia- -Continue on Zetia  -continue OTC fish oil supplements. Take 2 of these BID. -Goal: HLD >60. Use healthy oils when cooking. -Discussed and recommended pt to reduce intake of saturated, trans fats and fatty carbohydrates.  -AHA dietary and exercise guidelines discussed.  -We will check lipid panel today.   4. Overweight -Recommended with the patient today to continuing with her exercise and lifestyle modifications to aid with weight loss. .     5. Hypothyroidism-  -Patient advised to continue on synthroid and cytomel. -Will check TSH, T3, and T4 today due to the patient's symptoms  -Will refill synthroid today  6. Fatigue     -She notes increased fatigue recently      -Will check labs today  7/8. Vertigo and dysfunction of both eustachian tubes:        -Advised the patient to began using AYR or Neilmed sinus rinses BID followed by flonase BID (one spray to each nostril). Advised that the patient may also incorporate allegra PRN.  -Will give the patient a handout regarding exercises for Benign positional vertigo   9. Tenosynivitis of finger and hand:       -Patient with remote SHx of right trigger finger release, perfomed by Dr. Amedeo Plenty.       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       -Due to recurrent symptoms, discussed with patient that I can refer the patient to a Sports           Medicine Orthopedist to evaluated symptoms as well as possible US guided injections to          right hand .     Will recheck A1c q 3 months and all other labs q 6 months and sooner if BP remains above goal of 135/85.    Education and routine counseling performed. Handouts provided.  Orders Placed This Encounter  Procedures  . Comprehensive metabolic  panel  . Magnesium  . Phosphorus  . Comprehensive metabolic panel  . CBC with Differential/Platelet  . Lipid panel  . Vitamin B12  . TSH  . T4, free  . VITAMIN D 25 Hydroxy (Vit-D Deficiency, Fractures)  . T3, free  . Ambulatory referral to Sports Medicine  . POCT glycosylated hemoglobin (Hb A1C)    No orders of the defined types were placed in this encounter.    Return in about 3 months (around 07/05/2018) for Please follow-up 3 months- DM, BP-bring log.   Anticipatory guidance and routine counseling done re: condition, txmnt options and need for follow up. All questions of patient's were answered.   Gross side effects, risk and  benefits, and alternatives of medications discussed with patient.  Patient is aware that all medications have potential side effects and we are unable to predict every sideeffect or drug-drug interaction that may occur.  Expresses verbal understanding and consents to current therapy plan and treatment regiment.  Please see AVS handed out to patient at the end of our visit for additional patient instructions/ counseling done pertaining to today's office visit.  Note: This document was prepared using Dragon voice recognition software and may include unintentional dictation errors.    This document serves as a record of services personally performed by Mellody Dance, DO. It was created on her behalf by Steva Colder, a trained medical scribe. The creation of this record is based on the scribe's personal observations and the provider's statements to them.   I have reviewed the above medical documentation for accuracy and completeness and I concur.  Mellody Dance 04/04/18 1:10 PM   ----------------------------------------------------------------------------------------------------------------------  Subjective:   CC:   Robin Greene is a 62 y.o. female who presents to [facility] today for a 3 month follow up regarding chronic diagnoses.   HLD HPI:   -She is doing well on the zetia. She has been trying to increase her exercise and she has been taking Zetia as prescribed with good tolerance.    Hyperthyroidism HPI:   -She is taking her synthroid and cytomel as prescribed and she would like her thyroid levels checked due to increased fatigue and her hair falling out.   HTN HPI:   -She has been tolerating her medications without difficulties. She has family and work stressors including her 10 year old dog who has been missing for 4 weeks.   -She notes room spinning dizziness when she gets out of bed.   DM HPI: -Pt is currently maintained on the following  medications for diabetes:   see med list today  Medication compliance - yes- 5 mg glipizide daily with good tolerance.   Home glucose readings range: 140 and no 2 hour post prandials completed recently due to being on vacation.    Denies polyuria/polydipsia.  Denies hypo/ hyperglycemia symptoms  Last diabetic eye exam was No results found for: HMDIABEYEEXA  Foot exam- UTD  -no other s-e  Right Hand Pain:  -She notes that she is having pain to her right hand and she had a prior trigger finger release procedure completed by Dr. Amedeo Plenty.   -The right hand pain is causing her to have weaker grip strength.   Last lipid panel as follows:  Lab Results  Component Value Date   CHOL 236 (H) 12/20/2017   HDL 55 12/20/2017   LDLCALC 154 (H) 12/20/2017   TRIG 137 12/20/2017   CHOLHDL 4.3 12/20/2017    Hepatic Function  Latest Ref Rng & Units 01/31/2018 07/26/2017 08/18/2016  Total Protein 6.0 - 8.5 g/dL - 6.8 -  Albumin 3.6 - 4.8 g/dL - 4.2 -  AST 0 - 40 IU/L - 20 32  ALT 0 - 32 IU/L 33(H) 31 44(A)  Alk Phosphatase 39 - 117 IU/L - 92 84  Total Bilirubin 0.0 - 1.2 mg/dL - 0.3 -   Last A1C in the office was:  Lab Results  Component Value Date   HGBA1C 6.6 04/04/2018   HGBA1C 7.5 (H) 12/20/2017   HGBA1C 5.9 (H) 07/26/2017    Lab Results  Component Value Date   MICROALBUR 10 01/01/2018   LDLCALC 154 (H) 12/20/2017   CREATININE 0.74 07/26/2017    Wt Readings from Last 3 Encounters:  04/04/18 169 lb 12.8 oz (77 kg)  01/01/18 166 lb 4.8 oz (75.4 kg)  09/19/17 162 lb (73.5 kg)    BP Readings from Last 3 Encounters:  04/04/18 (!) 169/92  01/01/18 139/82  09/19/17 (!) 148/79     Wt Readings from Last 3 Encounters:  04/04/18 169 lb 12.8 oz (77 kg)  01/01/18 166 lb 4.8 oz (75.4 kg)  09/19/17 162 lb (73.5 kg)   BP Readings from Last 3 Encounters:  04/04/18 (!) 169/92  01/01/18 139/82  09/19/17 (!) 148/79   Pulse Readings from Last 3 Encounters:  04/04/18 76  01/01/18  66  09/19/17 60   BMI Readings from Last 3 Encounters:  04/04/18 27.83 kg/m  01/01/18 27.25 kg/m  09/19/17 25.76 kg/m     Patient Care Team    Relationship Specialty Notifications Start End  Mellody Dance, DO PCP - General Family Medicine  09/27/16   Ronald Lobo, MD Consulting Physician Gastroenterology  09/27/16   Delrae Rend, MD Consulting Physician Endocrinology  09/27/16    Comment: Roseanne Kaufman, MD Consulting Physician Dermatology  09/27/16   Arvella Nigh, MD Consulting Physician Obstetrics and Gynecology  09/27/16   Vevelyn Royals, MD Consulting Physician Ophthalmology  09/27/16     Full medical history updated and reviewed in the office today  Patient Active Problem List   Diagnosis Date Noted  . Hypertension associated with diabetes ([place]) 09/27/2016    Priority: High  . Hyperlipidemia associated with type 2 diabetes mellitus ([place]) 09/27/2016    Priority: High  . Diabetes mellitus ([place]) 02/07/2013    Priority: High  . Acute reaction to situational stress 10/30/2016    Priority: Medium  . Noncompliance with treatment regimen 10/18/2016    Priority: Medium  . Hypothyroidism 09/27/2016    Priority: Medium  . Obesity, Class I, BMI 30-34.9 09/27/2016    Priority: Medium  . Vitamin D deficiency 09/27/2016    Priority: Low  . HSV-1 infection 09/27/2016    Priority: Low  . Anemia 09/27/2016    Priority: Low  . Environmental and seasonal allergies 09/27/2016    Priority: Low  . Tenosynovitis of finger and hand 04/04/2018  . Dysfunction of both eustachian tubes 04/04/2018  . Vertigo 04/04/2018  . Fatigue 04/04/2018  . Vulvovaginitis 01/01/2018  . Postmenopausal 07/18/2017  . Umbilical hernia without obstruction and without gangrene 03/13/2017    Past Medical History:  Diagnosis Date  . Anemia   . Arthritis   . Complication of anesthesia   . Diabetes mellitus    dx 2010  . GERD (gastroesophageal reflux disease)    occasional   will  take OTC  . HSV-1 infection   . Hypertension   . PONV (postoperative  nausea and vomiting)   . Vitamin D deficiency     Past Surgical History:  Procedure Laterality Date  . ABDOMINAL HYSTERECTOMY    . EYE SURGERY    . INSERTION OF MESH N/A 04/30/2017   Procedure: INSERTION OF MESH;  Surgeon: Jackolyn Confer, MD;  Location: [facility];  Service: General;  Laterality: N/A;  . OVARY SURGERY    . REFRACTIVE SURGERY    . UMBILICAL HERNIA REPAIR N/A 04/30/2017   Procedure: UMBILICAL HERNIA REPAIR WITH MESH;  Surgeon: Jackolyn Confer, MD;  Location: [facility];  Service: General;  Laterality: N/A;    Social History   Tobacco Use  . Smoking status: Never Smoker  . Smokeless tobacco: Never Used  Substance Use Topics  . Alcohol use: Yes    Comment: occasionally    Family Hx: Family History  Problem Relation Age of Onset  . Sjogren's syndrome Mother   . Hypertension Father   . Hyperlipidemia Father   . Healthy Sister      Medications: Current Outpatient Medications  Medication Sig Dispense Refill  . ALPRAZolam (XANAX) 0.25 MG tablet Take 0.25 mg by mouth 3 (three) times daily as needed for anxiety.    . Cholecalciferol (VITAMIN D) 2000 units tablet Take 2.5 tablets (5,000 Units total) by mouth daily.    . cyanocobalamin 2000 MCG tablet Take 2,000 mcg by mouth daily.    Marland Kitchen ezetimibe (ZETIA) 10 MG tablet Take 1 tablet (10 mg total) by mouth daily. 90 tablet 3  . ferrous sulfate 325 (65 FE) MG EC tablet Take 1 tablet (325 mg total) by mouth 2 (two) times daily. 180 tablet 1  . FOLIC ACID PO Take 1 tablet by mouth daily.    [place] Kitchen glipiZIDE (GLUCOTROL XL) 5 MG 24 hr tablet Take 1 tablet (5 mg total) by mouth daily with breakfast. 90 tablet 1  . glucose blood test strip Inject 1 each into the skin as needed. Use as instructed    . hydrochlorothiazide (HYDRODIURIL) 25 MG tablet TAKE 1 TABLET (25 MG TOTAL) BY MOUTH DAILY. 90 tablet 0  . levothyroxine (SYNTHROID, LEVOTHROID) 100 MCG tablet Take 1  tablet (100 mcg total) by mouth daily before breakfast. 90 tablet 1  . liothyronine (CYTOMEL) 5 MCG tablet Take 1 tablet (5 mcg total) by mouth 2 (two) times daily. 180 tablet 1  . lisinopril (PRINIVIL,ZESTRIL) 40 MG tablet Take 1 tablet (40 mg total) by mouth daily. 90 tablet 1  . metformin (FORTAMET) 1000 MG (OSM) 24 hr tablet Take 1,000 mg by mouth 2 (two) times daily.    . metroNIDAZOLE (METROGEL) 1 % gel Apply 1 application topically daily as needed (rosacea).     . Omega-3 Fatty Acids (FISH OIL) 1000 MG CAPS Take 1200 mg fish oil 2 tablets twice daily    . terconazole (TERAZOL 7) 0.4 % vaginal cream Place 1 applicator vaginally at bedtime. 45 g 1  . valACYclovir (VALTREX) 1000 MG tablet Take 500 mg by mouth 2 (two) times daily as needed (fever blisters). 1/2 tablet twice daily for 5 days as needed for flares      No current facility-administered medications for this visit.     Allergies:  Allergies  Allergen Reactions  . Asa [Aspirin] Anaphylaxis  . Crestor [Rosuvastatin Calcium] Other (See Comments)    Abnormal LFTs  . Claritin [Loratadine] Other (See Comments)    Fainted twice   . Invokana [Canagliflozin] Other (See Comments)    Genital yeast infections  . Vania Rea [  Empagliflozin] Other (See Comments)    Genital yeast infection  . Lipitor [Atorvastatin] Other (See Comments)    Muscle aches   . Simvastatin Other (See Comments)    Muscle aches    Review of Systems: General:   Denies fever, chills, unexplained weight loss. (+) hair falling out. (+) fatigue Optho/Auditory:   Denies visual changes, blurred vision/LOV Respiratory:   Denies SOB, DOE more than baseline levels.  Cardiovascular:   Denies chest pain, palpitations, new onset peripheral edema  Gastrointestinal:   Denies nausea, vomiting, diarrhea.  Genitourinary: Denies dysuria, freq/ urgency, flank pain or discharge from genitals.  Endocrine:     Denies hot or cold intolerance, polyuria,  polydipsia. Musculoskeletal:   Denies unexplained myalgias, joint swelling, unexplained arthralgias, gait problems.  Skin:  Denies rash, suspicious lesions Neurological:     Denies unexplained weakness, numbness (+)room spinning dizziness Psychiatric/Behavioral:   Denies mood changes, suicidal or homicidal ideations, hallucinations      Objective:  Blood pressure (!) 169/92, pulse 76, height 5' 5.5" (1.664 m), weight 169 lb 12.8 oz (77 kg), SpO2 98 %. Body mass index is 27.83 kg/m.    Gen:   Well NAD, A and O *3 HEENT:    [place]/AT, EOMI,  MMM. Right TM slightly retracted with good light reflects and no effusion. Left TM with mild fullness. No carotid bruits bilaterally.  Lungs:   Normal work of breathing. CTA B/L, no Wh, rhonchi Heart:   RRR, S1, S2 WNL's, no MRG Abd:   No gross distention Exts:    warm, pink,  Brisk capillary refill, warm and well perfused.  MSK: Scar present mid right palm along the ray of 2nd digit. Exam with decreased strength, flexion and extension of 3rd and 4th fingers. Slightly decreased grip versus left. NVI distally.  Psych:    No HI/SI, judgement and insight good, Euthymic mood. Full Affect.    Recent Results (from the past 2160 hour(s))  ALT     Status: Abnormal   Collection Time: 01/31/18  9:06 AM  Result Value Ref Range   ALT 33 (H) 0 - 32 IU/L  POCT glycosylated hemoglobin (Hb A1C)     Status: Normal   Collection Time: 04/04/18  9:35 AM  Result Value Ref Range   Hemoglobin A1C  4.0 - 5.6 %   HbA1c, POC (prediabetic range)  5.7 - 6.4 %   HbA1c, POC (controlled diabetic range) 6.6 0.0 - 7.0 %

## 2018-04-04 NOTE — Patient Instructions (Addendum)
Please follow-up in 3 months for your diabetes and sooner if blood pressure at home remains above goal of 135/85 or less.  Please check at home on a daily basis and write it down.  Bring this log in next office visit.  Again, if not at goal please do not wait another 3 months before you come in.    GOALS :  The goals are for the Hgb A1C to be less than 7.0 & blood pressure to be less than 130/80.    It is recommended that all diabetics are educated on and follow a healthy diabetic diet, exercise for 30 minutes 3-4 times per week (walking, biking, swimming, or machine), monitor blood glucose readings and bring that record with you to be reviewed at your next office visit.     You should be checking fasting blood sugars- especially after you eat poorly or eat really healthy, and also check 2 hour postprandial blood sugars after largest meal of the day.    Write these down and bring in your log at each office visit.    You will need to be seen every 3 months by the provider managing your Diabetes unless told otherwise by that provider.   You will need yearly eye exams from an eye specialist and foot exams to check the nerves of your feet.  Also, your urine should be checked yearly as well to make sure excess protein is not present.   If you are checking your blood pressure at home, please record it and bring it to your next office visit.    Follow the Dietary Approaches to Stop Hypertension (DASH) diet (3 servings of fruit and vegetables daily, whole grains, low sodium, low-fat proteins).  See below.    Lastly, when it comes to your cholesterol, the goal is to have the HDL (good cholesterol) >40, and the LDL (bad cholesterol) <100.   It is recommended that you follow a heart healthy, low saturated and trans-fat diet and exercise for 30 minutes at least 5 times a week.     (( Check out the DASH diet = 1.5 Gram Low Sodium Diet   A 1.5 gram sodium diet restricts the amount of sodium in the diet to  no more than 1.5 g or 1500 mg daily.  The American Heart Association recommends Americans over the age of 14 to consume no more than 1500 mg of sodium each day to reduce the risk of developing high blood pressure.  Research also shows that limiting sodium may reduce heart attack and stroke risk.  Many foods contain sodium for flavor and sometimes as a preservative.  When the amount of sodium in a diet needs to be low, it is important to know what to look for when choosing foods and drinks.  The following includes some information and guidelines to help make it easier for you to adapt to a low sodium diet.    QUICK TIPS  Do not add salt to food.  Avoid convenience items and fast food.  Choose unsalted snack foods.  Buy lower sodium products, often labeled as "lower sodium" or "no salt added."  Check food labels to learn how much sodium is in 1 serving.  When eating at a restaurant, ask that your food be prepared with less salt or none, if possible.    READING FOOD LABELS FOR SODIUM INFORMATION  The nutrition facts label is a good place to find how much sodium is in foods. Look for products with  no more than 400 mg of sodium per serving.  Remember that 1.5 g = 1500 mg.  The food label may also list foods as:  Sodium-free: Less than 5 mg in a serving.  Very low sodium: 35 mg or less in a serving.  Low-sodium: 140 mg or less in a serving.  Light in sodium: 50% less sodium in a serving. For example, if a food that usually has 300 mg of sodium is changed to become light in sodium, it will have 150 mg of sodium.  Reduced sodium: 25% less sodium in a serving. For example, if a food that usually has 400 mg of sodium is changed to reduced sodium, it will have 300 mg of sodium.    CHOOSING FOODS  Grains  Avoid: Salted crackers and snack items. Some cereals, including instant hot cereals. Bread stuffing and biscuit mixes. Seasoned rice or pasta mixes.  Choose: Unsalted snack items. Low-sodium cereals,  oats, puffed wheat and rice, shredded wheat. English muffins and bread. Pasta.  Meats  Avoid: Salted, canned, smoked, spiced, pickled meats, including fish and poultry. Bacon, ham, sausage, cold cuts, hot dogs, anchovies.  Choose: Low-sodium canned tuna and salmon. Fresh or frozen meat, poultry, and fish.  Dairy  Avoid: Processed cheese and spreads. Cottage cheese. Buttermilk and condensed milk. Regular cheese.  Choose: Milk. Low-sodium cottage cheese. Yogurt. Sour cream. Low-sodium cheese.  Fruits and Vegetables  Avoid: Regular canned vegetables. Regular canned tomato sauce and paste. Frozen vegetables in sauces. Olives. Angie Fava. Relishes. Sauerkraut.  Choose: Low-sodium canned vegetables. Low-sodium tomato sauce and paste. Frozen or fresh vegetables. Fresh and frozen fruit.  Condiments  Avoid: Canned and packaged gravies. Worcestershire sauce. Tartar sauce. Barbecue sauce. Soy sauce. Steak sauce. Ketchup. Onion, garlic, and table salt. Meat flavorings and tenderizers.  Choose: Fresh and dried herbs and spices. Low-sodium varieties of mustard and ketchup. Lemon juice. Tabasco sauce. Horseradish.    SAMPLE 1.5 GRAM SODIUM MEAL PLAN:   Breakfast / Sodium (mg)  1 cup low-fat milk / 143 mg  1 whole-wheat English muffin / 240 mg  1 tbs heart-healthy margarine / 153 mg  1 hard-boiled egg / 139 mg  1 small orange / 0 mg  Lunch / Sodium (mg)  1 cup raw carrots / 76 mg  2 tbs no salt added peanut butter / 5 mg  2 slices whole-wheat bread / 270 mg  1 tbs jelly / 6 mg   cup red grapes / 2 mg  Dinner / Sodium (mg)  1 cup whole-wheat pasta / 2 mg  1 cup low-sodium tomato sauce / 73 mg  3 oz lean ground beef / 57 mg  1 small side salad (1 cup raw spinach leaves,  cup cucumber,  cup yellow bell pepper) with 1 tsp olive oil and 1 tsp red wine vinegar / 25 mg  Snack / Sodium (mg)  1 container low-fat vanilla yogurt / 107 mg  3 graham cracker squares / 127 mg  Nutrient Analysis  Calories: 1745   Protein: 75 g  Carbohydrate: 237 g  Fat: 57 g  Sodium: 1425 mg  Document Released: 10/02/2005 Document Revised: 06/14/2011 Document Reviewed: 01/03/2010  ExitCare Patient Information 2012 [place].))      How to Treat Vertigo at Home with Exercises  What is Vertigo?  Vertigo is a relatively common symptom most often associated with conditions such as sinusitis (inflammation of your sinuses due to viruses, allergies, or bacterial infections), or an inner ear infection or ear  trauma.   It can be brought on by trauma (e.g. a blow to the head or whiplash) or more serious things like minor strokes.   Symptoms can also be brought on by normal degenerative changes to your inner ear that occur with aging.  The condition tends to be more commonly seen in the elderly but it can occur in all ages.    Patients most often complain of dizziness, as if the room is spinning around them.   Symptoms are provoked by quick head movements or changes in position like going from standing to lying in bed, or even turning over in bed.   It may present with nausea and/or vomiting, and can be very debilitating to some folks.    By far the most common cause, known as Benign Paroxysmal Positional Vertigo (BPPV), is categorized by a sudden onset of symptoms, that are intense but short-lived (60 seconds or less), which is triggered by a change in head position.   Symptoms usually dissipate if you stay in one position and do not move your head.   Within the inner ear are collections of calcium carbonate crystals referred to as "otoliths" which may become dislodged from their normal position and migrate into the semicircular canals of the inner ear, throwing off your body's ability to sense where you are in space.     Fig. 921 Anatomy of the Right Osseous Labyrinth. Antonieta Iba. Anatomy of the Human Body. 1918.            What Else Could Be Behind My Vertigo?  Some other causes of vertigo  include:  Meniere's disease (disorder of inner ear with ringing in ears, feeling of fullness/pressure within ear, and fluctuating hearing loss) Tumours Neurological disorders e.g. Multiple Sclerosis Motion Sickness (lack of coordination between visual stimuli, inner ear balance and positional sense) Migraine Labyrinthitis (inflammation of the fluid-filled tubes and sacs within the inner ear; may also be associated with changes in hearing) Vestibular neuritis (inflammation of the nerves associated with transmission of sensory info from the inner ear; usually of viral origins)  How it can be treated/cured? While certain medications have been prescribed for vertigo including Lorazepam your doing well 7 house the house going organizing and getting things ready for sale with the and Meclizine (for motion sickness), there exists no evidence to support a recommendation of any medication in the routine treatment of BPPV.  Clinical trials have demonstrated that repositioning techniques (listed below) are a superior option for management Otis Dials et al., 2008).    Figure above:  (A) Instructions for the modified Epley procedure (MEP) for left ear posterior canal benign paroxysmal positional vertigo (PC-BPPV). For right ear BPPV, the procedure has to be performed in the opposite direction, starting with the head turned to the right side.  1. Start by sitting on a bed with your head turned 45 to the left. Place a pillow behind you so that on lying back it will be under your shoulders.  2. Lie back quickly with shoulders on the pillow, neck extended, and head resting on the bed. In this position, the affected (left) ear is underneath. Wait for 30 secondS.  3. Turn your head 90 to the right (without raising it), and wait again for 30 seconds.  4. Turn your body and head another 90 to the right, and wait for another 30 seconds.  5. Sit up on the right side. This maneuver should be performed three times a day.  Repeat this daily until  you are free from positional vertigo for 24 hours.   (B) Instructions for the modified Semont maneuver (MSM) for left ear PC-BPPV. For right ear BPPV, the maneuver has to be performed in the opposite direction, starting with the head turned toward the left ear.  1. Sit upright on a bed with your head turned 45 toward the right ear.  2. Drop quickly to the left side, so that your head touches the bed behind your left ear. Wait 30 seconds.  3. Move head and trunk in a swift movement toward the other side without stopping in the upright position, so that your head comes to rest on the right side of your forehead. Wait again for 30 seconds.  4. Sit up again.  This maneuver should be performed three times a day. Repeat this daily until you are free from positional vertigo symptoms for 24 hours.   (   See the video in the supplementary material on the NeurologyWeb site; go to http://www.neurology.org/content/63/1/150/F1.expansion.html   )     You can also try this motion at home as well- Self-Treatment of Benign Paroxysmal Positional Vertigo Benign Paroxysmal Positioning Vertigo is caused by loose inner ear crystals in the inner ear that migrate while sleeping to the back-bottom inner ear balance canal, the so-called "posterior semi-circular canal." The maneuver demonstrated below is the way to reposition the loose crystals so that the symptoms caused by the loose crystals go away. You may have a floating, swaying sense while walking or sitting for a few days after this procedure.

## 2018-04-05 LAB — CBC WITH DIFFERENTIAL/PLATELET
BASOS: 1 %
Basophils Absolute: 0.1 10*3/uL (ref 0.0–0.2)
EOS (ABSOLUTE): 0.5 10*3/uL — AB (ref 0.0–0.4)
EOS: 8 %
HEMATOCRIT: 39.9 % (ref 34.0–46.6)
Hemoglobin: 12.8 g/dL (ref 11.1–15.9)
IMMATURE GRANULOCYTES: 0 %
Immature Grans (Abs): 0 10*3/uL (ref 0.0–0.1)
LYMPHS ABS: 1.7 10*3/uL (ref 0.7–3.1)
Lymphs: 27 %
MCH: 28 pg (ref 26.6–33.0)
MCHC: 32.1 g/dL (ref 31.5–35.7)
MCV: 87 fL (ref 79–97)
MONOS ABS: 0.4 10*3/uL (ref 0.1–0.9)
Monocytes: 6 %
NEUTROS PCT: 58 %
Neutrophils Absolute: 3.7 10*3/uL (ref 1.4–7.0)
PLATELETS: 317 10*3/uL (ref 150–450)
RBC: 4.57 x10E6/uL (ref 3.77–5.28)
RDW: 15.9 % — AB (ref 12.3–15.4)
WBC: 6.4 10*3/uL (ref 3.4–10.8)

## 2018-04-05 LAB — LIPID PANEL
CHOL/HDL RATIO: 3.6 ratio (ref 0.0–4.4)
Cholesterol, Total: 207 mg/dL — ABNORMAL HIGH (ref 100–199)
HDL: 58 mg/dL (ref >39)
LDL Calculated: 114 mg/dL — ABNORMAL HIGH (ref 0–99)
Triglycerides: 177 mg/dL — ABNORMAL HIGH (ref 0–149)
VLDL Cholesterol Cal: 35 mg/dL (ref 5–40)

## 2018-04-05 LAB — COMPREHENSIVE METABOLIC PANEL
ALK PHOS: 70 IU/L (ref 39–117)
ALT: 29 IU/L (ref 0–32)
AST: 26 IU/L (ref 0–40)
Albumin/Globulin Ratio: 1.8 (ref 1.2–2.2)
Albumin: 4.6 g/dL (ref 3.6–4.8)
BUN/Creatinine Ratio: 26 (ref 12–28)
BUN: 20 mg/dL (ref 8–27)
Bilirubin Total: 0.3 mg/dL (ref 0.0–1.2)
CHLORIDE: 102 mmol/L (ref 96–106)
CO2: 23 mmol/L (ref 20–29)
CREATININE: 0.76 mg/dL (ref 0.57–1.00)
Calcium: 10.7 mg/dL — ABNORMAL HIGH (ref 8.7–10.3)
GFR calc Af Amer: 97 mL/min/{1.73_m2} (ref >59)
GFR calc non Af Amer: 84 mL/min/{1.73_m2} (ref >59)
GLUCOSE: 163 mg/dL — AB (ref 65–99)
Globulin, Total: 2.6 g/dL (ref 1.5–4.5)
Potassium: 5.2 mmol/L (ref 3.5–5.2)
SODIUM: 142 mmol/L (ref 134–144)
Total Protein: 7.2 g/dL (ref 6.0–8.5)

## 2018-04-05 LAB — MAGNESIUM: MAGNESIUM: 2 mg/dL (ref 1.6–2.3)

## 2018-04-05 LAB — VITAMIN B12

## 2018-04-05 LAB — VITAMIN D 25 HYDROXY (VIT D DEFICIENCY, FRACTURES): VIT D 25 HYDROXY: 58.4 ng/mL (ref 30.0–100.0)

## 2018-04-05 LAB — TSH: TSH: 4.05 u[IU]/mL (ref 0.450–4.500)

## 2018-04-05 LAB — T4, FREE: FREE T4: 0.86 ng/dL (ref 0.82–1.77)

## 2018-04-05 LAB — T3, FREE: T3 FREE: 3.6 pg/mL (ref 2.0–4.4)

## 2018-04-05 LAB — PHOSPHORUS: Phosphorus: 4.1 mg/dL (ref 2.5–4.5)

## 2018-04-08 NOTE — Progress Notes (Signed)
[facility] [address] [place], [place] [address] Phone: [phone] Subjective:    I'm seeing this patient by the request  of:  Mellody Dance, DO   CC: Right hand pain  DTO:[id number]  Robin Greene is a 62 y.o. female coming in with complaint of right hand pain. History of trigger finger. States that she uses a mouse a lot at work. Palm of the hand is painful. States her hand doesn't bend all the way. No numbness and tingling. Loss of ROM and strength. When she wakes up her hand is stiff. When she's really active her hand swells.   Onset- 2 months  Location- Palm of hand  Duration- Worse in the morning and night  Character- Achy  Aggravating factors- Twisting caps Reliving factors- Heat Therapies tried-  Severity-5 out of 10     Past Medical History:  Diagnosis Date  . Anemia   . Arthritis   . Complication of anesthesia   . Diabetes mellitus    dx 2010  . GERD (gastroesophageal reflux disease)    occasional   will take OTC  . HSV-1 infection   . Hypertension   . PONV (postoperative nausea and vomiting)   . Vitamin D deficiency    Past Surgical History:  Procedure Laterality Date  . ABDOMINAL HYSTERECTOMY    . EYE SURGERY    . INSERTION OF MESH N/A 04/30/2017   Procedure: INSERTION OF MESH;  Surgeon: Jackolyn Confer, MD;  Location: [facility];  Service: General;  Laterality: N/A;  . OVARY SURGERY    . REFRACTIVE SURGERY    . UMBILICAL HERNIA REPAIR N/A 04/30/2017   Procedure: UMBILICAL HERNIA REPAIR WITH MESH;  Surgeon: Jackolyn Confer, MD;  Location: [facility];  Service: General;  Laterality: N/A;   Social History   Socioeconomic History  . Marital status: Married    Spouse name: Not on file  . Number of children: Not on file  . Years of education: Not on file  . Highest education level: Not on file  Occupational History  . Not on file  Social Needs  . Financial resource strain: Not on file  . Food insecurity:    Worry: Not on  file    Inability: Not on file  . Transportation needs:    Medical: Not on file    Non-medical: Not on file  Tobacco Use  . Smoking status: Never Smoker  . Smokeless tobacco: Never Used  Substance and Sexual Activity  . Alcohol use: Yes    Comment: occasionally  . Drug use: No  . Sexual activity: Yes    Birth control/protection: Surgical  Lifestyle  . Physical activity:    Days per week: Not on file    Minutes per session: Not on file  . Stress: Not on file  Relationships  . Social connections:    Talks on phone: Not on file    Gets together: Not on file    Attends religious service: Not on file    Active member of club or organization: Not on file    Attends meetings of clubs or organizations: Not on file    Relationship status: Not on file  Other Topics Concern  . Not on file  Social History Narrative  . Not on file   Allergies  Allergen Reactions  . Asa [Aspirin] Anaphylaxis  . Crestor [Rosuvastatin Calcium] Other (See Comments)    Abnormal LFTs  . Claritin [Loratadine] Other (See Comments)  Fainted twice   . Invokana [Canagliflozin] Other (See Comments)    Genital yeast infections  . Jardiance [Empagliflozin] Other (See Comments)    Genital yeast infection  . Lipitor [Atorvastatin] Other (See Comments)    Muscle aches   . Simvastatin Other (See Comments)    Muscle aches   Family History  Problem Relation Age of Onset  . Sjogren's syndrome Mother   . Hypertension Father   . Hyperlipidemia Father   . Healthy Sister      Past medical history, social, surgical and family history all reviewed in electronic medical record.  No pertanent information unless stated regarding to the chief complaint.   Review of Systems:Review of systems updated and as accurate as of 04/09/18  No headache, visual changes, nausea, vomiting, diarrhea, constipation, dizziness, abdominal pain, skin rash, fevers, chills, night sweats, weight loss, swollen lymph nodes, body aches,  joint swelling,  chest pain, shortness of breath, mood changes.  Positive muscle aches  Objective  Blood pressure 140/80, pulse 61, height 5' 5.5" (1.664 m), weight 170 lb (77.1 kg), SpO2 98 %. Systems examined below as of 04/09/18   General: No apparent distress alert and oriented x3 mood and affect normal, dressed appropriately.  HEENT: Pupils equal, extraocular movements intact  Respiratory: Patient's speak in full sentences and does not appear short of breath  Cardiovascular: No lower extremity edema, non tender, no erythema  Skin: Warm dry intact with no signs of infection or rash on extremities or on axial skeleton.  Abdomen: Soft nontender  Neuro: Cranial nerves II through XII are intact, neurovascularly intact in all extremities with 2+ DTRs and 2+ pulses.  Lymph: No lymphadenopathy of posterior or anterior cervical chain or axillae bilaterally.  Gait normal with good balance and coordination.  MSK:  Non tender with full range of motion and good stability and symmetric strength and tone of shoulders, elbows, wrist, hip, knee and ankles bilaterally.  Right hand exam shows that patient does have some triggering of the fourth A2 pulley good grip strength, neurovascular intact, good capillary blood flow noted.  Full range of motion   Procedure: Real-time Ultrasound Guided Injection of right fourth flexor tendon sheath Device: GE Logiq Q7 Ultrasound guided injection is preferred based studies that show increased duration, increased effect, greater accuracy, decreased procedural pain, increased response rate, and decreased cost with ultrasound guided versus blind injection.  Verbal informed consent obtained.  Time-out conducted.  Noted no overlying erythema, induration, or other signs of local infection.  Skin prepped in a sterile fashion.  Local anesthesia: Topical Ethyl chloride.  With sterile technique and under real time ultrasound guidance: With a 25-gauge half inch needle patient  was injected with 0.5 cc of 0.5% Marcaine and 0.5 cc of Kenalog 40 mg/mL Completed without difficulty  Pain immediately resolved suggesting accurate placement of the medication.  Advised to call if fevers/chills, erythema, induration, drainage, or persistent bleeding.  Images permanently stored and available for review in the ultrasound unit.  Impression: Technically successful ultrasound guided injection.     Impression and Recommendations:     This case required medical decision making of moderate complexity.      Note: This dictation was prepared with Dragon dictation along with smaller phrase technology. Any transcriptional errors that result from this process are unintentional.

## 2018-04-09 ENCOUNTER — Ambulatory Visit: Payer: Self-pay

## 2018-04-09 ENCOUNTER — Encounter: Payer: Self-pay | Admitting: Family Medicine

## 2018-04-09 ENCOUNTER — Ambulatory Visit: Payer: No Typology Code available for payment source | Admitting: Family Medicine

## 2018-04-09 VITALS — BP 140/80 | HR 61 | Ht 65.5 in | Wt 170.0 lb

## 2018-04-09 DIAGNOSIS — M79641 Pain in right hand: Secondary | ICD-10-CM | POA: Diagnosis not present

## 2018-04-09 DIAGNOSIS — M65341 Trigger finger, right ring finger: Secondary | ICD-10-CM | POA: Diagnosis not present

## 2018-04-09 NOTE — Assessment & Plan Note (Signed)
Patient given injection today.  Tolerated procedure well.  Discussed icing regimen, bracing at night, we discussed over-the-counter vitamin D for strength and endurance.  Follow-up again in 4 weeks.  Worsening symptoms consider further imaging but likely will do well.  Also can refer to Occupational Therapy for hand if necessary.

## 2018-04-09 NOTE — Patient Instructions (Addendum)
Good to see you  Wear the splint at night Injected the finger today to help it and take 2 weeks to work completely.  Vitamin D 2000 IU daily  See me again in 4 weeks if not resolved.

## 2018-04-10 ENCOUNTER — Encounter: Payer: Self-pay | Admitting: Family Medicine

## 2018-04-11 ENCOUNTER — Other Ambulatory Visit: Payer: Self-pay

## 2018-04-12 ENCOUNTER — Other Ambulatory Visit: Payer: Self-pay

## 2018-04-12 ENCOUNTER — Ambulatory Visit (INDEPENDENT_AMBULATORY_CARE_PROVIDER_SITE_OTHER): Payer: No Typology Code available for payment source

## 2018-04-12 VITALS — BP 138/84 | HR 70 | Temp 98.8°F | Ht 65.5 in | Wt 170.0 lb

## 2018-04-12 DIAGNOSIS — E039 Hypothyroidism, unspecified: Secondary | ICD-10-CM

## 2018-04-12 DIAGNOSIS — Z23 Encounter for immunization: Secondary | ICD-10-CM | POA: Diagnosis not present

## 2018-04-12 MED ORDER — LEVOTHYROXINE SODIUM 100 MCG PO TABS: 100.0000 ug | ORAL_TABLET | Freq: Every day | ORAL | 1 refills | Status: DC

## 2018-04-12 MED FILL — glipiZIDE XL 5 MG TB24: 5 | 90 days supply | Qty: 90 | Fill #1

## 2018-04-12 MED FILL — SYNTHROID 100 MCG TABLET: 100 | 90 days supply | Qty: 90 | Fill #0

## 2018-04-12 NOTE — Progress Notes (Signed)
Patient was here to day for her Shingrix vaccine.  Patient tolerated vaccine well and will return in 2 to 6 months for the second injection.  Patient was given the VIS for the vaccine. MPulliam, CMA/RT(R)

## 2018-04-12 NOTE — Telephone Encounter (Signed)
Refill on levothyroxine per Dr. Raliegh Scarlet.  See patient advise note. MPulliam, CMA/RT(R)

## 2018-04-15 MED FILL — valACYclovir HCL 1 GM TABS: 1 | 30 days supply | Qty: 30 | Fill #0

## 2018-04-15 MED FILL — metFORMIN HCL 1000 MG TABS: 1000 | 90 days supply | Qty: 180 | Fill #2

## 2018-04-22 ENCOUNTER — Other Ambulatory Visit: Payer: Self-pay

## 2018-04-22 DIAGNOSIS — E039 Hypothyroidism, unspecified: Secondary | ICD-10-CM

## 2018-04-22 MED ORDER — LEVOTHYROXINE SODIUM 100 MCG PO TABS: 100.0000 ug | ORAL_TABLET | Freq: Every day | ORAL | 1 refills | Status: DC

## 2018-04-22 NOTE — Telephone Encounter (Signed)
Refill on synthroid. MPulliam, CMA/RT(R)

## 2018-05-06 NOTE — Progress Notes (Signed)
[facility] [place], [place] [address] Phone: [phone] Subjective:    I'm seeing this patient by the request  of:    CC: Hand pain  DTO:[id number]  Robin Greene is a 62 y.o. female coming in with complaint of hand pain. She received an injection that alleviated her pain. She had pain for 2 weeks after the injection but she has not had any pain for 1 week.  No new problems.  Feeling very good at this time.    Past Medical History:  Diagnosis Date  . Anemia   . Arthritis   . Complication of anesthesia   . Diabetes mellitus    dx 2010  . GERD (gastroesophageal reflux disease)    occasional   will take OTC  . HSV-1 infection   . Hypertension   . PONV (postoperative nausea and vomiting)   . Vitamin D deficiency    Past Surgical History:  Procedure Laterality Date  . ABDOMINAL HYSTERECTOMY    . EYE SURGERY    . INSERTION OF MESH N/A 04/30/2017   Procedure: INSERTION OF MESH;  Surgeon: Jackolyn Confer, MD;  Location: [facility];  Service: General;  Laterality: N/A;  . OVARY SURGERY    . REFRACTIVE SURGERY    . UMBILICAL HERNIA REPAIR N/A 04/30/2017   Procedure: UMBILICAL HERNIA REPAIR WITH MESH;  Surgeon: Jackolyn Confer, MD;  Location: [facility];  Service: General;  Laterality: N/A;   Social History   Socioeconomic History  . Marital status: Married    Spouse name: Not on file  . Number of children: Not on file  . Years of education: Not on file  . Highest education level: Not on file  Occupational History  . Not on file  Social Needs  . Financial resource strain: Not on file  . Food insecurity:    Worry: Not on file    Inability: Not on file  . Transportation needs:    Medical: Not on file    Non-medical: Not on file  Tobacco Use  . Smoking status: Never Smoker  . Smokeless tobacco: Never Used  Substance and Sexual Activity  . Alcohol use: Yes    Comment: occasionally  . Drug use: No  . Sexual activity: Yes    Birth  control/protection: Surgical  Lifestyle  . Physical activity:    Days per week: Not on file    Minutes per session: Not on file  . Stress: Not on file  Relationships  . Social connections:    Talks on phone: Not on file    Gets together: Not on file    Attends religious service: Not on file    Active member of club or organization: Not on file    Attends meetings of clubs or organizations: Not on file    Relationship status: Not on file  Other Topics Concern  . Not on file  Social History Narrative  . Not on file   Allergies  Allergen Reactions  . Asa [Aspirin] Anaphylaxis  . Crestor [Rosuvastatin Calcium] Other (See Comments)    Abnormal LFTs  . Claritin [Loratadine] Other (See Comments)    Fainted twice   . Invokana [Canagliflozin] Other (See Comments)    Genital yeast infections  . Jardiance [Empagliflozin] Other (See Comments)    Genital yeast infection  . Lipitor [Atorvastatin] Other (See Comments)    Muscle aches   . Simvastatin Other (See Comments)    Muscle aches  Family History  Problem Relation Age of Onset  . Sjogren's syndrome Mother   . Hypertension Father   . Hyperlipidemia Father   . Healthy Sister      Past medical history, social, surgical and family history all reviewed in electronic medical record.  No pertanent information unless stated regarding to the chief complaint.   Review of Systems:Review of systems updated and as accurate as of 05/07/18  No headache, visual changes, nausea, vomiting, diarrhea, constipation, dizziness, abdominal pain, skin rash, fevers, chills, night sweats, weight loss, swollen lymph nodes, body aches, joint swelling, muscle aches, chest pain, shortness of breath, mood changes.   Objective  Blood pressure 138/78, pulse 80, height 5' 5.5" (1.664 m), weight 169 lb (76.7 kg), SpO2 98 %. Systems examined below as of 05/07/18   General: No apparent distress alert and oriented x3 mood and affect normal, dressed  appropriately.  HEENT: Pupils equal, extraocular movements intact  Respiratory: Patient's speak in full sentences and does not appear short of breath  Cardiovascular: No lower extremity edema, non tender, no erythema  Skin: Warm dry intact with no signs of infection or rash on extremities or on axial skeleton.  Abdomen: Soft nontender  Neuro: Cranial nerves II through XII are intact, neurovascularly intact in all extremities with 2+ DTRs and 2+ pulses.  Lymph: No lymphadenopathy of posterior or anterior cervical chain or axillae bilaterally.  Gait normal with good balance and coordination.  MSK:  Non tender with full range of motion and good stability and symmetric strength and tone of shoulders, elbows, wrist, hip, knee and ankles bilaterally.  Very mild arthritic changes of the hands noted.  No triggering over the fourth finger anymore.  No nodule in the palmar aspect noted.    Impression and Recommendations:     This case required medical decision making of moderate complexity.      Note: This dictation was prepared with Dragon dictation along with smaller phrase technology. Any transcriptional errors that result from this process are unintentional.

## 2018-05-07 ENCOUNTER — Ambulatory Visit: Payer: No Typology Code available for payment source | Admitting: Family Medicine

## 2018-05-07 ENCOUNTER — Encounter: Payer: Self-pay | Admitting: Family Medicine

## 2018-05-07 DIAGNOSIS — M65341 Trigger finger, right ring finger: Secondary | ICD-10-CM

## 2018-05-07 NOTE — Patient Instructions (Signed)
Good to see you  Alvera Singh is your friend.  Stay active.  See me again when you need me  [phone]

## 2018-05-07 NOTE — Assessment & Plan Note (Signed)
Patient doing very well 4 weeks after the injection.  Follow-up as needed.

## 2018-05-10 ENCOUNTER — Other Ambulatory Visit: Payer: Self-pay | Admitting: Family Medicine

## 2018-05-10 DIAGNOSIS — I10 Essential (primary) hypertension: Secondary | ICD-10-CM

## 2018-05-10 MED FILL — HYDROCHLOROTHIAZIDE 25 MG T: 25 | 90 days supply | Qty: 90 | Fill #0

## 2018-05-10 MED FILL — LIOTHYRONINE SODIUM 5 MCG T: 5 | 90 days supply | Qty: 180 | Fill #1

## 2018-05-27 MED FILL — LISINOPRIL 40 MG TABLET: 40 | 90 days supply | Qty: 90 | Fill #1

## 2018-07-02 ENCOUNTER — Ambulatory Visit: Payer: No Typology Code available for payment source | Admitting: Family Medicine

## 2018-07-02 MED FILL — EZETIMIBE 10 MG TABLET: 10 | 90 days supply | Qty: 90 | Fill #2

## 2018-07-15 ENCOUNTER — Other Ambulatory Visit: Payer: Self-pay | Admitting: Family Medicine

## 2018-07-15 MED FILL — glipiZIDE XL 5 MG TB24: 5 | 90 days supply | Qty: 90 | Fill #0

## 2018-07-15 MED FILL — metFORMIN HCL 1000 MG TABS: 1000 | 30 days supply | Qty: 60 | Fill #0

## 2018-07-15 MED FILL — SYNTHROID 100 MCG TABLET: 100 | 90 days supply | Qty: 90 | Fill #1

## 2018-07-22 LAB — HM DEXA SCAN

## 2018-08-05 MED FILL — TERCONAZOLE 0.4% VAG CREAM: 0.4 | 7 days supply | Qty: 45 | Fill #1

## 2018-08-05 MED FILL — NITROFURANTOIN MONO-MCR 100: 100 | 7 days supply | Qty: 14 | Fill #0

## 2018-08-05 MED FILL — FLUCONAZOLE 150 MG TABS: 150 | 1 days supply | Qty: 1 | Fill #0

## 2018-08-07 ENCOUNTER — Ambulatory Visit: Payer: No Typology Code available for payment source | Admitting: Family Medicine

## 2018-08-08 ENCOUNTER — Other Ambulatory Visit: Payer: Self-pay | Admitting: Family Medicine

## 2018-08-08 DIAGNOSIS — I10 Essential (primary) hypertension: Secondary | ICD-10-CM

## 2018-08-08 MED FILL — LIOTHYRONINE SODIUM 5 MCG T: 5 | 30 days supply | Qty: 60 | Fill #3

## 2018-08-08 MED FILL — HYDROCHLOROTHIAZIDE 25 MG T: 25 | 90 days supply | Qty: 90 | Fill #0

## 2018-08-15 ENCOUNTER — Telehealth: Payer: Self-pay | Admitting: Family Medicine

## 2018-08-15 NOTE — Telephone Encounter (Signed)
Patient has an appt on 11/14 for f/u, but will be out of her metformin in a few days, she is requesting a refill to be sent to [facility], please advise if this is something we can do the patient

## 2018-08-16 ENCOUNTER — Other Ambulatory Visit: Payer: Self-pay

## 2018-08-16 DIAGNOSIS — E119 Type 2 diabetes mellitus without complications: Secondary | ICD-10-CM

## 2018-08-16 MED ORDER — METFORMIN HCL ER (OSM) 1000 MG PO TB24: 1000.0000 mg | ORAL_TABLET | Freq: Two times a day (BID) | ORAL | 0 refills | Status: DC

## 2018-08-16 MED FILL — metFORMIN HCL 1000 MG TABS: 1000 | 30 days supply | Qty: 60 | Fill #0

## 2018-08-16 NOTE — Telephone Encounter (Signed)
Okay to send in 30 days per Dr. Raliegh Scarlet.  Patient notified. MPulliam, CMA/RT(R)

## 2018-08-16 NOTE — Telephone Encounter (Signed)
Patient called requesting refill on metformin until OV.  Patient is no longer seeing Dr. Buddy Duty.  Per Dr. Raliegh Scarlet okay to fill for 30 days. MPulliam, CMA/RT(R)

## 2018-08-21 ENCOUNTER — Other Ambulatory Visit: Payer: Self-pay | Admitting: Obstetrics and Gynecology

## 2018-08-21 ENCOUNTER — Ambulatory Visit
Admission: RE | Admit: 2018-08-21 | Discharge: 2018-08-21 | Disposition: A | Discharge status: Home / Self Care | Payer: No Typology Code available for payment source | Source: Ambulatory Visit | Attending: Obstetrics and Gynecology | Admitting: Obstetrics and Gynecology

## 2018-08-21 DIAGNOSIS — Z1231 Encounter for screening mammogram for malignant neoplasm of breast: Secondary | ICD-10-CM

## 2018-08-29 ENCOUNTER — Ambulatory Visit (INDEPENDENT_AMBULATORY_CARE_PROVIDER_SITE_OTHER): Payer: No Typology Code available for payment source | Admitting: Family Medicine

## 2018-08-29 ENCOUNTER — Encounter: Payer: Self-pay | Admitting: Family Medicine

## 2018-08-29 VITALS — BP 130/84 | HR 75 | Temp 98.2°F | Ht 66.0 in | Wt 172.4 lb

## 2018-08-29 DIAGNOSIS — E785 Hyperlipidemia, unspecified: Secondary | ICD-10-CM

## 2018-08-29 DIAGNOSIS — E1159 Type 2 diabetes mellitus with other circulatory complications: Secondary | ICD-10-CM

## 2018-08-29 DIAGNOSIS — E1169 Type 2 diabetes mellitus with other specified complication: Secondary | ICD-10-CM | POA: Diagnosis not present

## 2018-08-29 DIAGNOSIS — E119 Type 2 diabetes mellitus without complications: Secondary | ICD-10-CM | POA: Diagnosis not present

## 2018-08-29 DIAGNOSIS — I152 Hypertension secondary to endocrine disorders: Secondary | ICD-10-CM

## 2018-08-29 DIAGNOSIS — Z23 Encounter for immunization: Secondary | ICD-10-CM

## 2018-08-29 DIAGNOSIS — F430 Acute stress reaction: Secondary | ICD-10-CM | POA: Diagnosis not present

## 2018-08-29 DIAGNOSIS — I10 Essential (primary) hypertension: Secondary | ICD-10-CM

## 2018-08-29 DIAGNOSIS — E663 Overweight: Secondary | ICD-10-CM

## 2018-08-29 LAB — POCT GLYCOSYLATED HEMOGLOBIN (HGB A1C): Hemoglobin A1C: 6.7 % — AB (ref 4.0–5.6)

## 2018-08-29 NOTE — Progress Notes (Signed)
Impression and Recommendations:    1. Type 2 diabetes mellitus without complication, unspecified whether long term insulin use (Robin Greene)   2. Hyperlipidemia associated with type 2 diabetes mellitus (Robin Greene)   3. Hypertension associated with diabetes (Robin Greene)   4. Acute reaction to situational stress   5. Overweight (BMI 25.0-29.9)   6. Need for shingles vaccine     - Need for Shingrix vaccine.   1. Diabetes Mellitus - A1c drawn today - 6.7, slightly up from 6.6. - Goal A1c of less than 7.0 discussed today.  - Stable at this time. - Continue treatment plan as prescribed.  See med list below. - Patient tolerating meds well without complication.  Denies S-E  - Counseled patient on pathophysiology of disease and discussed various treatment options, which often includes dietary and lifestyle modifications as first line.  Importance of low carb/ketogenic diet discussed with patient in addition to regular exercise.   - Check FBS and 2 hours after the biggest meal of your day.  Keep log and bring in next OV for my review.   Also, if you ever feel poorly, please check your blood pressure and blood sugar, as one or the other could be the cause of your symptoms.  - Being a diabetic, you need yearly eye and foot exams. Make appt.for diabetic eye exam.    2. Hypertension - Blood pressure remains controlled at this time. - Continue treatment plan as prescribed.  See med list below. - Patient tolerating meds well without complication.  Denies S-E  - Discussed goal BP of 130/80 or less.  - Lifestyle changes such as dash diet and engaging in a regular exercise program discussed with patient.  Educational handouts provided  - Ambulatory BP monitoring encouraged. Keep log and bring in next OV.  - Will continue to monitor.   3. BMI Counseling Explained to patient what BMI refers to, and what it means medically.    Told patient to think about it as a "medical risk stratification measurement"  and how increasing BMI is associated with increasing risk/ or worsening state of various diseases such as hypertension, hyperlipidemia, diabetes, premature OA, depression etc.  American Heart Association guidelines for healthy diet, basically Mediterranean diet, and exercise guidelines of 30 minutes 5 days per week or more discussed in detail.  Health counseling performed.  All questions answered.   4. Lifestyle & Preventative Health Maintenance - Advised patient to continue working toward exercising to improve overall mental, physical, and emotional health.    - Discussed the importance of engaging in weight-bearing exercise to prevent osteoporosis.  - Reviewed the "spokes of the wheel" of mood and health management.  Stressed the importance of ongoing prudent habits, including regular exercise, appropriate sleep hygiene, healthful dietary habits, and prayer/meditation to relax.  - Encouraged patient to practice self-love.  - Encouraged patient to engage in daily physical activity, especially a formal exercise routine.  Recommended that the patient eventually strive for at least 150 minutes of moderate cardiovascular activity per week according to guidelines established by the [facility].   - Healthy dietary habits encouraged, including low-carb, and high amounts of lean protein in diet.   - Patient should also consume adequate amounts of water.   5. Follow-Up - Goal is to begin exercising 3 days per week at lunch, 10-20 min per day. - Encouraged patient to engage in weight lifting two days per week.  - UTD on labs.  Re-check lab work as recommended. - Otherwise,  continue to return for CPE and chronic follow-up as scheduled.   - Patient knows to call in sooner if desired to address acute concerns.    Orders Placed This Encounter  Procedures  . Varicella-zoster vaccine IM (Shingrix)  . POCT glycosylated hemoglobin (Hb A1C)    Gross side effects, risk and benefits, and alternatives of  medications and treatment plan in general discussed with patient.  Patient is aware that all medications have potential side effects and we are unable to predict every side effect or drug-drug interaction that may occur.   Patient will call with any questions prior to using medication if they have concerns.    Expresses verbal understanding and consents to current therapy and treatment regimen.  No barriers to understanding were identified.  Red flag symptoms and signs discussed in detail.  Patient expressed understanding regarding what to do in case of emergency\urgent symptoms  Please see AVS handed out to patient at the end of our visit for further patient instructions/ counseling done pertaining to today's office visit.   Return for 4 mo since doing so well!!  DM, BP, HLD etc.     Note:  This note was prepared with assistance of Dragon voice recognition software. Occasional wrong-word or sound-a-like substitutions may have occurred due to the inherent limitations of voice recognition software.   This document serves as a record of services personally performed by Mellody Dance, DO. It was created on her behalf by Toni Amend, a trained medical scribe. The creation of this record is based on the scribe's personal observations and the provider's statements to them.   I have reviewed the above medical documentation for accuracy and completeness and I concur.  Mellody Dance, DO, D.O. 08/30/2018 10:05 AM    ---------------------------------------------------------------------------------------------------    Subjective:     HPI: Robin Greene is a 62 y.o. female who presents to [facility] today for issues as discussed below.  Patient is excited about some life developments that are taking place.  She's in the process of selling a rental house.  Also going through some family stressors and related family drama.  Her stepdaughter moved back to [place],  and patient is currently helping to raise her stepdaughter's 21 year old son who remains in the area.  She has a good relationship with her husband's other daughter and children.  Aside from her family stressors she's been feeling good.  Patient recently had a mammogram and bone density DEXA scan done.  DM HPI: -  She has not been working on diet and exercise for diabetes.  Thinks her sugars are up in recent times because she's been stressed, gone off of her diet, and hasn't been exercising regularly.  She has gained about three pounds.  Pt is currently maintained on the following medications for diabetes:   see med list today  Medication compliance - Continues on medications as prescribed.  Home glucose readings range: Has not been checking her blood sugars recently.   Denies polyuria/polydipsia. Denies hypo/hyperglycemia symptoms.  Last diabetic eye exam was No results found for: HMDIABEYEEXA  Had last diabetic eye exam yesterday.  No diabetic retinopathy.  Foot exam- UTD  Last A1C in the office was:  Lab Results  Component Value Date   HGBA1C 6.7 (A) 08/29/2018   HGBA1C 6.6 04/04/2018   HGBA1C 7.5 (H) 12/20/2017    Lab Results  Component Value Date   MICROALBUR 10 01/01/2018   [place] 114 (H) 04/04/2018  CREATININE 0.76 04/04/2018    Wt Readings from Last 3 Encounters:  08/29/18 172 lb 6.4 oz (78.2 kg)  05/07/18 169 lb (76.7 kg)  04/12/18 170 lb (77.1 kg)    BP Readings from Last 3 Encounters:  08/29/18 130/84  05/07/18 138/78  04/12/18 138/84    1. HTN HPI: -  Her blood pressure has been controlled at home.  Pt is checking it at home.   130's/80's at home.  States it was 130/82 the other day.  - Patient reports good compliance with blood pressure medications  - Denies medication S-E   - Smoking Status noted; never smoker.  - She denies new onset of: chest pain, exercise intolerance, shortness of breath, dizziness, visual changes, headache, lower  extremity swelling or claudication.   Last 3 blood pressure readings in our office are as follows: BP Readings from Last 3 Encounters:  08/29/18 130/84  05/07/18 138/78  04/12/18 138/84    Filed Weights   08/29/18 1320  Weight: 172 lb 6.4 oz (78.2 kg)    Wt Readings from Last 3 Encounters:  08/29/18 172 lb 6.4 oz (78.2 kg)  05/07/18 169 lb (76.7 kg)  04/12/18 170 lb (77.1 kg)   BP Readings from Last 3 Encounters:  08/29/18 130/84  05/07/18 138/78  04/12/18 138/84   Pulse Readings from Last 3 Encounters:  08/29/18 75  05/07/18 80  04/12/18 70   BMI Readings from Last 3 Encounters:  08/29/18 27.83 kg/m  05/07/18 27.70 kg/m  04/12/18 27.86 kg/m     Patient Care Team    Relationship Specialty Notifications Start End  Mellody Dance, DO PCP - General Family Medicine  09/27/16   Ronald Lobo, MD Consulting Physician Gastroenterology  09/27/16   Delrae Rend, MD Consulting Physician Endocrinology  09/27/16    Comment: Roseanne Kaufman, MD Consulting Physician Dermatology  09/27/16   Arvella Nigh, MD Consulting Physician Obstetrics and Gynecology  09/27/16   Vevelyn Royals, MD Consulting Physician Ophthalmology  09/27/16      Patient Active Problem List   Diagnosis Date Noted  . Hypertension associated with diabetes ([place]) 09/27/2016    Priority: High  . Hyperlipidemia associated with type 2 diabetes mellitus ([place]) 09/27/2016    Priority: High  . Diabetes mellitus ([place]) 02/07/2013    Priority: High  . Acute reaction to situational stress 10/30/2016    Priority: Medium  . Noncompliance with treatment regimen 10/18/2016    Priority: Medium  . Hypothyroidism 09/27/2016    Priority: Medium  . Overweight (BMI 25.0-29.9) 09/27/2016    Priority: Medium  . Vitamin D deficiency 09/27/2016    Priority: Low  . HSV-1 infection 09/27/2016    Priority: Low  . Anemia 09/27/2016    Priority: Low  . Environmental and seasonal allergies 09/27/2016     Priority: Low  . Trigger finger, right ring finger 04/09/2018  . Tenosynovitis of finger and hand 04/04/2018  . Dysfunction of both eustachian tubes 04/04/2018  . Vertigo 04/04/2018  . Fatigue 04/04/2018  . Vulvovaginitis 01/01/2018  . Postmenopausal 07/18/2017  . Umbilical hernia without obstruction and without gangrene 03/13/2017    Past Medical history, Surgical history, Family history, Social history, Allergies and Medications have been entered into the medical record, reviewed and changed as needed.    Current Meds  Medication Sig  . ALPRAZolam (XANAX) 0.25 MG tablet Take 0.25 mg by mouth 3 (three) times daily as needed for anxiety.  . Cholecalciferol (VITAMIN D) 2000 units tablet Take 2.5  tablets (5,000 Units total) by mouth daily.  . cyanocobalamin 2000 MCG tablet Take 2,000 mcg by mouth daily.  Marland Kitchen ezetimibe (ZETIA) 10 MG tablet Take 1 tablet (10 mg total) by mouth daily.  . ferrous sulfate 325 (65 FE) MG EC tablet Take 1 tablet (325 mg total) by mouth 2 (two) times daily.  Marland Kitchen FOLIC ACID PO Take 1 tablet by mouth daily.  Marland Kitchen GLIPIZIDE XL 5 MG 24 hr tablet TAKE 1 TABLET (5 MG TOTAL) BY MOUTH DAILY WITH BREAKFAST.  Marland Kitchen glucose blood test strip Inject 1 each into the skin as needed. Use as instructed  . hydrochlorothiazide (HYDRODIURIL) 25 MG tablet TAKE 1 TABLET (25 MG TOTAL) BY MOUTH DAILY.  Marland Kitchen levothyroxine (SYNTHROID, LEVOTHROID) 100 MCG tablet Take 1 tablet (100 mcg total) by mouth daily before breakfast.  . liothyronine (CYTOMEL) 5 MCG tablet Take 1 tablet (5 mcg total) by mouth 2 (two) times daily.  Marland Kitchen lisinopril (PRINIVIL,ZESTRIL) 40 MG tablet Take 1 tablet (40 mg total) by mouth daily.  . metformin (FORTAMET) 1000 MG (OSM) 24 hr tablet Take 1 tablet (1,000 mg total) by mouth 2 (two) times daily.  . metroNIDAZOLE (METROGEL) 1 % gel Apply 1 application topically daily as needed (rosacea).   . Omega-3 Fatty Acids (FISH OIL) 1000 MG CAPS Take 1200 mg fish oil 2 tablets twice daily  .  terconazole (TERAZOL 7) 0.4 % vaginal cream Place 1 applicator vaginally at bedtime.  . valACYclovir (VALTREX) 1000 MG tablet Take 500 mg by mouth 2 (two) times daily as needed (fever blisters). 1/2 tablet twice daily for 5 days as needed for flares     Allergies:  Allergies  Allergen Reactions  . Asa [Aspirin] Anaphylaxis  . Crestor [Rosuvastatin Calcium] Other (See Comments)    Abnormal LFTs  . Claritin [Loratadine] Other (See Comments)    Fainted twice   . Invokana [Canagliflozin] Other (See Comments)    Genital yeast infections  . Jardiance [Empagliflozin] Other (See Comments)    Genital yeast infection  . Lipitor [Atorvastatin] Other (See Comments)    Muscle aches   . Simvastatin Other (See Comments)    Muscle aches     Review of Systems:  A fourteen system review of systems was performed and found to be positive as per HPI.   Objective:   Blood pressure 130/84, pulse 75, temperature 98.2 F (36.8 C), height 5\' 6"  (1.676 m), weight 172 lb 6.4 oz (78.2 kg), SpO2 99 %. Body mass index is 27.83 kg/m. General:  Well Developed, well nourished, appropriate for stated age.  Neuro:  Alert and oriented,  extra-ocular muscles intact  HEENT:  Normocephalic, atraumatic, neck supple, no carotid bruits appreciated  Skin:  no gross rash, warm, pink. Cardiac:  RRR, S1 S2 Respiratory:  ECTA B/L and A/P, Not using accessory muscles, speaking in full sentences- unlabored. Vascular:  Ext warm, no cyanosis apprec.; cap RF less 2 sec. Psych:  No HI/SI, judgement and insight good, Euthymic mood. Full Affect.

## 2018-08-29 NOTE — Patient Instructions (Signed)
Your goal is to exercise 20 minutes 3 days/week to start, then increase that as tolerated to eventually get to 150 minutes/week of moderate intensity aerobic activity  Please realize, EXERCISE IS MEDICINE!  -  American Heart Association [facility]) guidelines for exercise : If you are in good health, without any medical conditions, you should engage in 150-300 minutes of moderate intensity aerobic activity per week.  This means you should be huffing and puffing throughout your workout.   Engaging in regular exercise will improve brain function and memory, as well as improve mood, boost immune system and help with weight management.  As well as the other, more well-known effects of exercise such as decreasing blood sugar levels, decreasing blood pressure,  and decreasing bad cholesterol levels/ increasing good cholesterol levels.     -  The AHA strongly endorses consumption of a diet that contains a variety of foods from all the food categories with an emphasis on fruits and vegetables; fat-free and low-fat dairy products; cereal and grain products; legumes and nuts; and fish, poultry, and/or extra lean meats.    Excessive food intake, especially of foods high in saturated and trans fats, sugar, and salt, should be avoided.    Adequate water intake of roughly 1/2 of your weight in pounds, should equal the ounces of water per day you should drink.  So for instance, if you're 200 pounds, that would be 100 ounces of water per day.         Mediterranean Diet  Why follow it? Research shows. . Those who follow the Mediterranean diet have a reduced risk of heart disease  . The diet is associated with a reduced incidence of Parkinson's and Alzheimer's diseases . People following the diet may have longer life expectancies and lower rates of chronic diseases  . The Dietary Guidelines for Americans recommends the Mediterranean diet as an eating plan to promote health and prevent disease  What Is the Mediterranean  Diet?  . Healthy eating plan based on typical foods and recipes of Mediterranean-style cooking . The diet is primarily a plant based diet; these foods should make up a majority of meals   Starches - Plant based foods should make up a majority of meals - They are an important sources of vitamins, minerals, energy, antioxidants, and fiber - Choose whole grains, foods high in fiber and minimally processed items  - Typical grain sources include wheat, oats, barley, corn, brown rice, bulgar, farro, millet, polenta, couscous  - Various types of beans include chickpeas, lentils, fava beans, black beans, white beans   Fruits  Veggies - Large quantities of antioxidant rich fruits & veggies; 6 or more servings  - Vegetables can be eaten raw or lightly drizzled with oil and cooked  - Vegetables common to the traditional Mediterranean Diet include: artichokes, arugula, beets, broccoli, brussel sprouts, cabbage, carrots, celery, collard greens, cucumbers, eggplant, kale, leeks, lemons, lettuce, mushrooms, okra, onions, peas, peppers, potatoes, pumpkin, radishes, rutabaga, shallots, spinach, sweet potatoes, turnips, zucchini - Fruits common to the Mediterranean Diet include: apples, apricots, avocados, cherries, clementines, dates, figs, grapefruits, grapes, melons, nectarines, oranges, peaches, pears, pomegranates, strawberries, tangerines  Fats - Replace butter and margarine with healthy oils, such as olive oil, canola oil, and tahini  - Limit nuts to no more than a handful a day  - Nuts include walnuts, almonds, pecans, pistachios, pine nuts  - Limit or avoid candied, honey roasted or heavily salted nuts - Olives are central to the Mediterranean diet -  can be eaten whole or used in a variety of dishes   Meats Protein - Limiting red meat: no more than a few times a month - When eating red meat: choose lean cuts and keep the portion to the size of deck of cards - Eggs: approx. 0 to 4 times a week  - Fish and  lean poultry: at least 2 a week  - Healthy protein sources include, chicken, [place], lean beef, lamb - Increase intake of seafood such as tuna, salmon, trout, mackerel, shrimp, scallops - Avoid or limit high fat processed meats such as sausage and bacon  Dairy - Include moderate amounts of low fat dairy products  - Focus on healthy dairy such as fat free yogurt, skim milk, low or reduced fat cheese - Limit dairy products higher in fat such as whole or 2% milk, cheese, ice cream  Alcohol - Moderate amounts of red wine is ok  - No more than 5 oz daily for women (all ages) and men older than age 75  - No more than 10 oz of wine daily for men younger than 85  Other - Limit sweets and other desserts  - Use herbs and spices instead of salt to flavor foods  - Herbs and spices common to the traditional Mediterranean Diet include: basil, bay leaves, chives, cloves, cumin, fennel, garlic, lavender, marjoram, mint, oregano, parsley, pepper, rosemary, sage, savory, sumac, tarragon, thyme   It's not just a diet, it's a lifestyle:  . The Mediterranean diet includes lifestyle factors typical of those in the region  . Foods, drinks and meals are best eaten with others and savored . Daily physical activity is important for overall good health . This could be strenuous exercise like running and aerobics . This could also be more leisurely activities such as walking, housework, yard-work, or taking the stairs . Moderation is the key; a balanced and healthy diet accommodates most foods and drinks . Consider portion sizes and frequency of consumption of certain foods   Meal Ideas & Options:  . Breakfast:  o Whole wheat toast or whole wheat English muffins with peanut butter & hard boiled egg o Steel cut oats topped with apples & cinnamon and skim milk  o Fresh fruit: banana, strawberries, melon, berries, peaches  o Smoothies: strawberries, bananas, greek yogurt, peanut butter o Low fat greek yogurt with  blueberries and granola  o Egg white omelet with spinach and mushrooms o Breakfast couscous: whole wheat couscous, apricots, skim milk, cranberries  . Sandwiches:  o Hummus and grilled vegetables (peppers, zucchini, squash) on whole wheat bread   o Grilled chicken on whole wheat pita with lettuce, tomatoes, cucumbers or tzatziki  o Tuna salad on whole wheat bread: tuna salad made with greek yogurt, olives, red peppers, capers, green onions o Garlic rosemary lamb pita: lamb sauted with garlic, rosemary, salt & pepper; add lettuce, cucumber, greek yogurt to pita - flavor with lemon juice and black pepper  . Seafood:  o Mediterranean grilled salmon, seasoned with garlic, basil, parsley, lemon juice and black pepper o Shrimp, lemon, and spinach whole-grain pasta salad made with low fat greek yogurt  o Seared scallops with lemon orzo  o Seared tuna steaks seasoned salt, pepper, coriander topped with tomato mixture of olives, tomatoes, olive oil, minced garlic, parsley, green onions and cappers  . Meats:  o Herbed greek chicken salad with kalamata olives, cucumber, feta  o Red bell peppers stuffed with spinach, bulgur, lean ground beef (or lentils) &  topped with feta   o Kebabs: skewers of chicken, tomatoes, onions, zucchini, squash  o [place] burgers: made with red onions, mint, dill, lemon juice, feta cheese topped with roasted red peppers . Vegetarian o Cucumber salad: cucumbers, artichoke hearts, celery, red onion, feta cheese, tossed in olive oil & lemon juice  o Hummus and whole grain pita points with a greek salad (lettuce, tomato, feta, olives, cucumbers, red onion) o Lentil soup with celery, carrots made with vegetable broth, garlic, salt and pepper  o Tabouli salad: parsley, bulgur, mint, scallions, cucumbers, tomato, radishes, lemon juice, olive oil, salt and pepper.       What is Chronic Stress Syndrome, Symptoms & Ways to Deal With it   What is Chronic Stress  Syndrome?  Chronic Stress Syndrome is something which can now be called as a medical condition due to the amount of stress an individual is going through these days. Chronic Stress Syndrome causes the body and mind to shutdown and the person has no control over himself or herself. Due to the demands of modern day life and the hardship throughout day and night takes its toll over a period of time and the body and brain starts demanding rest and a break. This leads to certain symptoms where your performance level starts to dip at work, you become irritable both at work and at home, you may stop enjoying activities you previously liked, you may become depressed, you may get angry for even small things. Chronic Stress Syndrome can significantly impact your quality life. Thus it is important understand the symptoms of Chronic Stress Syndrome and react accordingly in order to cope up with it.  It is important to note here that a balanced work-home equation should be drawn to cut down symptoms of Chronic Stress Syndrome. Minor stressors can be overcome by the body's inbuilt stress response but when there is unending stress for a long period of time then an external help is required to ease the stress.  Chronic Stress Syndrome can physically and psychologically drain you over a period of time. For such cases stress management is the best way to cope up with Chronic Stress Syndrome. If Chronic Stress Syndrome is not treated then it may result in many health hazards like anxiety, muscle pain, insomnia, and high blood pressure along with a compromised immune system leading to frequent infections and missed days from work.    What are the Symptoms of Chronic Stress Syndrome?   The symptoms of Chronic Stress Syndrome are variable and range from generalized symptoms to emotional symptoms along with behavioral and cognitive symptoms. Some of these symptoms have been delineated below:  Generalized Symptoms of Chronic  Stress Syndrome are: Anxiety Depression Social isolation Headache Abdominal pain Lack of sleep Back pain Difficulty in concentrating Hypertension Hemorrhoids Varicose veins Panic attacks/ Panic disorder Cardiovascular diseases.   Some of the Emotional Symptoms of Chronic Stress Syndrome are: To become easily agitated, moody and frustrated Feeling overwhelmed which makes you feel like you are losing control. Having difficulty relaxing and have a peaceful mind Having low self esteem Feeling lonely Feeling worthless Feeling depressed Avoiding social environment.   Some of the Physical Symptoms of Chronic Stress Syndrome are: Headaches Lethargy Alternating diarrhea and constipation Nausea Muscles aches and pains Insomnia Rapid heartbeat and chest pain Infections and frequent colds Decreased libido Nervousness and shaking Tinnitus Sweaty palms Dry mouth Clenched jaw.  Some of the Cognitive Symptoms of Chronic Stress Syndrome are: Constant worrying Racing thoughts Disorganization  and forgetfulness Inability to focus Poor judgment Abundance of negativity.  Some of the Behavioral Symptoms of Chronic Stress Syndrome are: Changes in appetite with less desire to eat Avoiding responsibilities Indulgence in alcohol or recreational drug use Increased nail biting and being fidgety Ways to Deal With Chronic Stress Syndrome    Chronic Stress Syndrome is not something which cannot be addressed. A bit of effort from your side in the form of lifestyle modifications, a little bit of exercise, a balanced work life equation can do wonders and help you get rid of Chronic Stress Syndrome.  Get Proper Sleep: It has been proved that Chronic Stress Syndrome causes loss of sleep where an individual may not even be able to sleep for days unending. This may result in the individual feeling lethargic and unable to focus at work the following morning. This may lead to decreased  performance at work. Thus, it is important to have a good sleep-wake cycle. For this, try and not drink any caffeinated beverage about four hours prior to going to sleep, as caffeine pumps up the adrenaline and causes you to stay awake resulting ultimately in Chronic Stress Syndrome.  Avoid Alcohol and Drugs: Another way to get rid of Chronic Stress Syndrome is lifestyle modifications. Stay away from alcohol and other recreational drugs. Take Short Frequent Breaks at Work: Try to take frequent breaks from work and do not work continuously. Try and manage your work in such a way that you even meet your deadline and come home on time for a happy dinner with family. A good time spent with family and kids does wonders in not only dealing with Chronic Stress Syndrome but also preventing it.  Become Physically Active: Another step towards getting rid of Chronic Stress Syndrome is physical activity. If you do not have time to spend at the gym then at least try and go for daily walks for about half an hour a day which not only keeps the stress away but also is good for your overall health. Physical activity leads to production of endorphins which will make you feel relaxed and feel good.  Healthy Diet Can Help You Deal With Chronic Stress Syndrome: Have a balanced and healthy diet is another step towards a stress free life and keeping Chronic Stress Syndrome at [place]. If time is a constraint then you can try eating three small meals a day. Try and avoid fast foods and take foods which are healthy and rich in proteins, fiber, and carbohydrates to boost your energy system.  Music Can Soothe Your Mind: Light music is one of the best and most effective relaxation techniques that one can try to overcome stress. It has shown to calm down the mind and take you away from all the stressors that you may be having. These days it is also being used as a therapy in some institutes for overcoming stress. It is important here to  discuss the importance of a good social support system for patients with Chronic Stress Syndrome, as a good social support framework can do wonders in taking the stress away from the patient and overcoming Chronic Stress Syndrome.  Meditation Can Help You Deal With Chronic Stress Syndrome Effectively: Meditation and yoga has also shown to be quite effective in relaxing the mind and coping up with Chronic Stress Syndrome   In cases where these measures are not helpful, then it is time for you to consult with a skilled psychologist or a psychiatrist for potential therapies or  medications to control the stress response.   The psychologist can help you with a variety of steps for coping up with Chronic Stress Syndrome. Relaxation techniques and behavioral therapy are some of the methods employed by psychologists. In some cases, medications can also be given to help relax the patient.  Since Chronic Stress Syndrome is both emotionally and physically draining for the patient and it also adversely affects the family life of the patient hence it is important for the patient to recognize the condition and taking steps to cope up with it. Escaping measures like alcohol and drug use are of no help as they only aggravate the condition apart from their other health hazards. If this condition is ignored or left untreated it can lead to various medical conditions like anxiety and depression and various other medical conditions.  Last but not least, smile as often as you can as it is the best gift that you can give to someone. The best way to stay relaxed is to have a good smile, exercise daily, spend time with your family, meditation and if required consultation with a good psychologist so that you can live a stress free life and overcome the symptoms of Chronic Stress Syndrome.

## 2018-09-18 ENCOUNTER — Other Ambulatory Visit: Payer: Self-pay | Admitting: Family Medicine

## 2018-09-18 MED FILL — LIOTHYRONINE SODIUM 5 MCG T: 5 | 30 days supply | Qty: 60 | Fill #4

## 2018-09-18 MED FILL — LISINOPRIL 40 MG TABLET: 40 | 90 days supply | Qty: 90 | Fill #0

## 2018-09-18 MED FILL — metFORMIN HCL 1000 MG TABS: 1000 | 90 days supply | Qty: 180 | Fill #0

## 2018-10-18 ENCOUNTER — Other Ambulatory Visit: Payer: Self-pay | Admitting: Adult Health

## 2018-10-18 ENCOUNTER — Telehealth: Payer: Self-pay | Admitting: Family Medicine

## 2018-10-18 MED ORDER — LIOTHYRONINE SODIUM 5 MCG PO TABS: 5.0000 ug | ORAL_TABLET | Freq: Two times a day (BID) | ORAL | 0 refills | Status: DC

## 2018-10-18 MED FILL — EZETIMIBE 10 MG TABS: 10 | 90 days supply | Qty: 90 | Fill #3

## 2018-10-18 MED FILL — SYNTHROID 100 MCG TABLET: 100 | 90 days supply | Qty: 90 | Fill #0

## 2018-10-18 MED FILL — LIOTHYRONINE SODIUM 5 MCG T: 5 | 30 days supply | Qty: 60 | Fill #0

## 2018-10-18 MED FILL — glipiZIDE XL 5 MG TB24: 5 | 90 days supply | Qty: 90 | Fill #1

## 2018-10-18 NOTE — Progress Notes (Signed)
Only one moth refill sent in, since she was instructed to return in Dec for thyroid panel

## 2018-10-18 NOTE — Telephone Encounter (Signed)
One month refill only sent in since she was instructed to return in Dec 2019 for thyroid panel Thanks! Robin Greene

## 2018-10-18 NOTE — Telephone Encounter (Signed)
Pt called to request Rx refill on :   liothyronine (CYTOMEL) 5 MCG tablet [[id number]]   Order Details  Dose: 5 mcg Route: Oral Frequency: 2 times daily  Dispense Quantity: 180 tablet Refills: 1 Fills remaining: --        Sig: Take 1 tablet (5 mcg total) by mouth 2 (two) times daily.     --Forwarding request to provider/medical assistant if approved pls send to :  [place], [name] - [address] [phone] (Phone) [phone] (Fax)   --Dion Body

## 2018-11-19 ENCOUNTER — Other Ambulatory Visit: Payer: Self-pay | Admitting: Family Medicine

## 2018-11-19 DIAGNOSIS — I10 Essential (primary) hypertension: Secondary | ICD-10-CM

## 2018-11-19 MED FILL — valACYclovir HCL 1 GM TABS: 1 | 30 days supply | Qty: 30 | Fill #1

## 2018-11-19 MED FILL — HYDROCHLOROTHIAZIDE 25 MG T: 25 | 90 days supply | Qty: 90 | Fill #0

## 2018-11-19 MED FILL — LIOTHYRONINE SODIUM 5 MCG T: 5 | 90 days supply | Qty: 90 | Fill #1

## 2018-12-16 MED FILL — LISINOPRIL 40 MG TABLET: 40 | 90 days supply | Qty: 90 | Fill #1

## 2018-12-16 MED FILL — metFORMIN HCL 1000 MG TABS: 1000 | 90 days supply | Qty: 180 | Fill #1

## 2018-12-27 ENCOUNTER — Other Ambulatory Visit: Payer: Self-pay

## 2018-12-27 ENCOUNTER — Encounter: Payer: Self-pay | Admitting: Family Medicine

## 2018-12-27 ENCOUNTER — Ambulatory Visit (INDEPENDENT_AMBULATORY_CARE_PROVIDER_SITE_OTHER): Payer: 59 | Admitting: Family Medicine

## 2018-12-27 VITALS — BP 134/85 | HR 83 | Temp 98.7°F | Ht 66.0 in | Wt 181.3 lb

## 2018-12-27 DIAGNOSIS — I10 Essential (primary) hypertension: Secondary | ICD-10-CM

## 2018-12-27 DIAGNOSIS — R5383 Other fatigue: Secondary | ICD-10-CM

## 2018-12-27 DIAGNOSIS — E039 Hypothyroidism, unspecified: Secondary | ICD-10-CM

## 2018-12-27 DIAGNOSIS — E119 Type 2 diabetes mellitus without complications: Secondary | ICD-10-CM | POA: Diagnosis not present

## 2018-12-27 DIAGNOSIS — E785 Hyperlipidemia, unspecified: Secondary | ICD-10-CM

## 2018-12-27 DIAGNOSIS — E663 Overweight: Secondary | ICD-10-CM

## 2018-12-27 DIAGNOSIS — E1159 Type 2 diabetes mellitus with other circulatory complications: Secondary | ICD-10-CM

## 2018-12-27 DIAGNOSIS — I152 Hypertension secondary to endocrine disorders: Secondary | ICD-10-CM

## 2018-12-27 DIAGNOSIS — E1169 Type 2 diabetes mellitus with other specified complication: Secondary | ICD-10-CM

## 2018-12-27 DIAGNOSIS — F430 Acute stress reaction: Secondary | ICD-10-CM

## 2018-12-27 LAB — POCT GLYCOSYLATED HEMOGLOBIN (HGB A1C): HEMOGLOBIN A1C: 10.2 % — AB (ref 4.0–5.6)

## 2018-12-27 LAB — POCT UA - MICROALBUMIN
Creatinine, POC: 200 mg/dL
Microalbumin Ur, POC: 80 mg/L

## 2018-12-27 MED ORDER — SEMAGLUTIDE(0.25 OR 0.5MG/DOS) 2 MG/1.5ML ~~LOC~~ SOPN: PEN_INJECTOR | SUBCUTANEOUS | 0 refills | Status: DC

## 2018-12-27 MED ORDER — BLOOD GLUCOSE METER KIT: PACK | 0 refills | Status: DC

## 2018-12-27 MED ORDER — FLUOXETINE HCL 10 MG PO TABS: ORAL_TABLET | ORAL | 0 refills | Status: DC

## 2018-12-27 MED FILL — FLUoxetine HCL 10 MG TABS: 10 | 90 days supply | Qty: 90 | Fill #0

## 2018-12-27 NOTE — Progress Notes (Signed)
Impression and Recommendations:    1. Type 2 diabetes mellitus without complication, unspecified whether long term insulin use ([place])-  deteriorated   2. acute reaction to extreme stress   3. Hypertension associated with diabetes ([place])   4. Hyperlipidemia associated with type 2 diabetes mellitus ([place])   5. Overweight (BMI 25.0-29.9)   6. Hypothyroidism, unspecified type   7. Fatigue, unspecified type      Type 2 diabetes mellitus without complication, unspecified whether long term insulin use ([facility])-  deteriorated - from 6.7 to now over 10.0 - Plan: POCT HgB A1C, HM Diabetes Foot Exam, CBC With Differential, Comprehensive metabolic panel, Lipid panel, Vitamin D 1,25 dihydroxy, TSH, T4, free, POCT UA - Microalbumin, blood glucose meter kit and supplies, DISCONTINUED: Semaglutide,0.25 or 0.5MG/DOS, (OZEMPIC, 0.25 OR 0.5 MG/DOSE,) 2 MG/1.5ML SOPN, CANCELED: Hemoglobin A1c  acute reaction to extreme stress - Plan: CBC With Differential, Comprehensive metabolic panel, Lipid panel, Vitamin D 1,25 dihydroxy, TSH, T4, free  Hypertension associated with diabetes (HCC) - Plan: CBC With Differential, Comprehensive metabolic panel, Lipid panel, Vitamin D 1,25 dihydroxy, TSH, T4, free  Hyperlipidemia associated with type 2 diabetes mellitus ([facility]) - Plan: CBC With Differential, Comprehensive metabolic panel, Lipid panel, Vitamin D 1,25 dihydroxy, TSH, T4, free  Overweight (BMI 25.0-29.9) - Plan: CBC With Differential, Comprehensive metabolic panel, Lipid panel, Vitamin D 1,25 dihydroxy, TSH, T4, free  Hypothyroidism, unspecified type - Plan: CBC With Differential, Comprehensive metabolic panel, Lipid panel, Vitamin D 1,25 dihydroxy, TSH, T4, free  Fatigue, unspecified type - Plan: CBC With Differential, Comprehensive metabolic panel, Lipid panel, Vitamin D 1,25 dihydroxy, TSH, T4, free  Type 2 DM:  -Last A1c was at 6.7 on 08/29/2018  -A1c today, 12/27/2018 at 10.2. Repeat A1c completed in the  office today. Repeat A1c at .  -Patient hasn't been checking her blood sugar at home due to her CBG monitor not working. Compliant on metformin and glipizide. Discussed with the patient regarding the significant in A1c to 10.2 today that insulin is the recommended treatment of choice. However, patient states that she doesn't want to proceed with insulin and would like to try another method.  -Discussed with the patient regarding the use of GLP1 medications (Ozempic) that are an once weekly injection to help decrease blood sugar levels. Will start the patient on Ozempic today. Discussed the side effects of this medication with the patient.  - Counseled patient on pathophysiology of disease and discussed various treatment options, which often includes dietary and lifestyle modifications as first line.  Importance of low carb/ketogenic diet discussed with patient in addition to regular exercise. - Check FBS and 2 hours after the biggest meal of your day at least 3-4 times a week. Keep log and bring in next OV for my review.  Also, if you ever feel poorly, please check your blood pressure and blood sugar, as one or the other could be the cause of your symptoms. - Being a diabetic, you need yearly eye and foot exams. Make appt.for diabetic eye exam  - Continue current medication(s) and will add on GLP1, Ozempic.  Also, risks and benefits of medications discussed with patient, including alternative treatments.   Encouraged patient to read drug information handouts to further educate self about the medicine prior to starting it. Contact us prior with any questions/ concerns.   Anxiety:  -Patient continues on Xanax with good tolerance (only when flying). Due to the recent family and life stressors, we will start the patient on  Prozac for daily use.  -Patient will start on 5 mg daily Prozac for 1 week, then if the patient is not having any side effects (most common, upset stomach), then the patient can proceed with  the full doe of 10 mg Prozac.  - Counseled patient on pathophysiology of disease and discussed various treatment options, which often includes dietary and lifestyle modifications as first line, in addition to discussing the risks and benefits of various medications.  - Importance of healthy eating, getting adequate sleep, daily exercise and seeking the help of a professional counselor discussed.  Pt encouraged to call one for an appt for counseling.  - Meditation and relaxation techniques discussed with patient.   Deep breathing/ Square breathing exercises reviewed.  - Encouraged daily meditation and regular exercise - Anticipatory guidance given and pt was encouraged to return to clinic or call the office with any further questions or concerns. -Will write the patient out of work for 3/16 and 3/17.   HTN:  -Blood pressure in the office today at 134/85.  -Advised that the patients' blood pressure should remain at or above goal of 130/80.  -Keep a blood pressure log and bring it into the office on next visit.     Follow up in 2 weeks for a follow up on Prozac and Ozempic. Labs completed in the office today.  Pt was interviewed and evaluated by me in the clinic today for 32.5+ minutes, with over 50% time spent in face to face counseling of patients various medical conditions, treatment plans of those medical conditions including medicine management and lifestyle modification, strategies to improve health and well being; and in coordination of care. SEE ABOVE TREATMENT PLAN FOR DETAILS     Education and routine counseling performed. Handouts provided.  Orders Placed This Encounter  Procedures  . CBC With Differential  . Comprehensive metabolic panel  . Lipid panel  . Vitamin D 1,25 dihydroxy  . TSH  . T4, free  . Hgb A1c w/o eAG  . Specimen status report  . POCT HgB A1C  . POCT UA - Microalbumin  . HM Diabetes Foot Exam    There are no discontinued medications.    Meds ordered  this encounter  Medications  . DISCONTD: Semaglutide,0.25 or 0.5MG/DOS, (OZEMPIC, 0.25 OR 0.5 MG/DOSE,) 2 MG/1.5ML SOPN    Sig: Inject 0.25 mg into the skin once a week for 30 days, THEN 0.5 mg once a week for 30 days.    Dispense:  5 pen    Refill:  0  . FLUoxetine (PROZAC) 10 MG tablet    Sig: Use one half tablet daily for 1 week then increase to 1 tablet daily    Dispense:  90 tablet    Refill:  0  . blood glucose meter kit and supplies    Sig: Dispense based on patient and insurance preference. Use three to four times weekly as directed. (FOR ICD-10 E10.9, E11.9).    Dispense:  1 each    Refill:  0    Order Specific Question:   Number of strips    Answer:   100    Order Specific Question:   Number of lancets    Answer:   100    The patient was counseled, risk factors were discussed, anticipatory guidance given.  Gross side effects, risk and benefits, and alternatives of medications discussed with patient.  Patient is aware that all medications have potential side effects and we are unable to predict every side  effect or drug-drug interaction that may occur.  Expresses verbal understanding and consents to current therapy plan and treatment regimen.   Return for 2) f/up 2 wks- new meds; .   Please see AVS handed out to patient at the end of our visit for further patient instructions/ counseling done pertaining to today's office visit.    Note:  This document was prepared using Dragon voice recognition software and may include unintentional dictation errors.   This document serves as a record of services personally performed by Mellody Dance, DO. It was created on her behalf by Steva Colder, a trained medical scribe. The creation of this record is based on the scribe's personal observations and the provider's statements to them.   I have reviewed the above medical documentation for accuracy and completeness and I concur.  Mellody Dance, DO 01/08/2019 4:29 PM         Subjective:    Chief Complaint  Patient presents with  . Follow-up    4 mo follow up     Robin Greene is a 63 y.o. female who presents to [facility] today for Diabetes Management.    Anxiety:  Patient uses Xanax only when she flies. She tried other anti-anxiety medications, however, notes that she didn't like it because it took away her emotion. She doesn't need a refill at this time of Xanax. She reports that her dog was recently lost and when she found her dog, she was informed that her 35 year old grandsons' pit bull killed her dog. She has been voicing to her husband and the grandson that the dog needs to be removed, however, they haven't listened to her.    DM HPI: -  She has been working on diet and exercise for diabetes. She is walking more when it isn't raining. She is still consuming wine.   Pt is currently maintained on the following medications for diabetes:   see med list today Medication compliance - compliant on metformin and glipizide.   Home glucose readings range: She hasn't been checking her blood sugar at home due to her monitor not working.    Denies polyuria. She has chronic polydipsia that hasn't changed from baseline at this time.  Denies hypo/ hyperglycemia symptoms - She denies new onset of: chest pain, exercise intolerance, shortness of breath, dizziness, visual changes, headache, lower extremity swelling or claudication.   Last diabetic eye exam was No results found for: HMDIABEYEEXA  Foot exam- UTD  Last A1C in the office was:  Lab Results  Component Value Date   HGBA1C 8.5 (H) 12/27/2018   HGBA1C 10.2 (A) 12/27/2018   HGBA1C 6.7 (A) 08/29/2018    Lab Results  Component Value Date   MICROALBUR 80 12/27/2018   LDLCALC 142 (H) 12/27/2018   CREATININE 0.80 12/27/2018      Last 3 blood pressure readings in our office are as follows: BP Readings from Last 3 Encounters:  12/27/18 134/85  08/29/18 130/84   05/07/18 138/78    BMI Readings from Last 3 Encounters:  12/27/18 29.26 kg/m  08/29/18 27.83 kg/m  05/07/18 27.70 kg/m     No problems updated.    Patient Care Team    Relationship Specialty Notifications Start End  Mellody Dance, DO PCP - General Family Medicine  09/27/16   Ronald Lobo, MD Consulting Physician Gastroenterology  09/27/16   Delrae Rend, MD Consulting Physician Endocrinology  09/27/16    Comment: Roseanne Kaufman, MD  Consulting Physician Dermatology  09/27/16   Arvella Nigh, MD Consulting Physician Obstetrics and Gynecology  09/27/16   Vevelyn Royals, MD Consulting Physician Ophthalmology  09/27/16      Patient Active Problem List   Diagnosis Date Noted  . Hypertension associated with diabetes ([place]) 09/27/2016    Priority: High  . Hyperlipidemia associated with type 2 diabetes mellitus ([place]) 09/27/2016    Priority: High  . Diabetes mellitus ([place]) 02/07/2013    Priority: High  . Acute reaction to situational stress 10/30/2016    Priority: Medium  . Noncompliance with treatment regimen 10/18/2016    Priority: Medium  . Hypothyroidism 09/27/2016    Priority: Medium  . Overweight (BMI 25.0-29.9) 09/27/2016    Priority: Medium  . Vitamin D deficiency 09/27/2016    Priority: Low  . HSV-1 infection 09/27/2016    Priority: Low  . Anemia 09/27/2016    Priority: Low  . Environmental and seasonal allergies 09/27/2016    Priority: Low  . acute reaction to extreme stress 12/27/2018  . Trigger finger, right ring finger 04/09/2018  . Tenosynovitis of finger and hand 04/04/2018  . Dysfunction of both eustachian tubes 04/04/2018  . Vertigo 04/04/2018  . Fatigue 04/04/2018  . Vulvovaginitis 01/01/2018  . Postmenopausal 07/18/2017  . Umbilical hernia without obstruction and without gangrene 03/13/2017     Past Medical History:  Diagnosis Date  . Anemia   . Arthritis   . Complication of anesthesia   . Diabetes mellitus    dx 2010   . GERD (gastroesophageal reflux disease)    occasional   will take OTC  . HSV-1 infection   . Hypertension   . PONV (postoperative nausea and vomiting)   . Vitamin D deficiency      Past Surgical History:  Procedure Laterality Date  . ABDOMINAL HYSTERECTOMY    . EYE SURGERY    . INSERTION OF MESH N/A 04/30/2017   Procedure: INSERTION OF MESH;  Surgeon: Jackolyn Confer, MD;  Location: [facility];  Service: General;  Laterality: N/A;  . OVARY SURGERY    . REFRACTIVE SURGERY    . UMBILICAL HERNIA REPAIR N/A 04/30/2017   Procedure: UMBILICAL HERNIA REPAIR WITH MESH;  Surgeon: Jackolyn Confer, MD;  Location: [facility];  Service: General;  Laterality: N/A;     Family History  Problem Relation Age of Onset  . Sjogren's syndrome Mother   . Hypertension Father   . Hyperlipidemia Father   . Healthy Sister      Social History   Substance and Sexual Activity  Drug Use No  ,  Social History   Substance and Sexual Activity  Alcohol Use Yes   Comment: occasionally  ,  Social History   Tobacco Use  Smoking Status Never Smoker  Smokeless Tobacco Never Used  ,    Current Outpatient Medications on File Prior to Visit  Medication Sig Dispense Refill  . ALPRAZolam (XANAX) 0.25 MG tablet Take 0.25 mg by mouth 3 (three) times daily as needed for anxiety.    . Cholecalciferol (VITAMIN D) 2000 units tablet Take 2.5 tablets (5,000 Units total) by mouth daily.    . cyanocobalamin 2000 MCG tablet Take 2,000 mcg by mouth daily.    Marland Kitchen ezetimibe (ZETIA) 10 MG tablet Take 1 tablet (10 mg total) by mouth daily. 90 tablet 3  . ferrous sulfate 325 (65 FE) MG EC tablet Take 1 tablet (325 mg total) by mouth 2 (two) times daily. 180 tablet 1  . FOLIC ACID PO  Take 1 tablet by mouth daily.    Marland Kitchen GLIPIZIDE XL 5 MG 24 hr tablet TAKE 1 TABLET (5 MG TOTAL) BY MOUTH DAILY WITH BREAKFAST. 90 tablet 1  . glucose blood test strip Inject 1 each into the skin as needed. Use as instructed    . hydrochlorothiazide  (HYDRODIURIL) 25 MG tablet TAKE 1 TABLET BY MOUTH DAILY. 90 tablet 0  . levothyroxine (SYNTHROID, LEVOTHROID) 100 MCG tablet Take 1 tablet (100 mcg total) by mouth daily before breakfast. 90 tablet 1  . lisinopril (PRINIVIL,ZESTRIL) 40 MG tablet TAKE 1 TABLET (40 MG TOTAL) BY MOUTH DAILY. 90 tablet 1  . metFORMIN (GLUCOPHAGE) 1000 MG tablet Take 1 tablet (1,000 mg total) by mouth 2 (two) times daily with a meal. 180 tablet 1  . metroNIDAZOLE (METROGEL) 1 % gel Apply 1 application topically daily as needed (rosacea).     . Omega-3 Fatty Acids (FISH OIL) 1000 MG CAPS Take 1200 mg fish oil 2 tablets twice daily    . terconazole (TERAZOL 7) 0.4 % vaginal cream Place 1 applicator vaginally at bedtime. 45 g 1  . valACYclovir (VALTREX) 1000 MG tablet Take 500 mg by mouth 2 (two) times daily as needed (fever blisters). 1/2 tablet twice daily for 5 days as needed for flares      No current facility-administered medications on file prior to visit.      Allergies  Allergen Reactions  . Asa [Aspirin] Anaphylaxis  . Crestor [Rosuvastatin Calcium] Other (See Comments)    Abnormal LFTs  . Claritin [Loratadine] Other (See Comments)    Fainted twice   . Invokana [Canagliflozin] Other (See Comments)    Genital yeast infections  . Jardiance [Empagliflozin] Other (See Comments)    Genital yeast infection  . Lipitor [Atorvastatin] Other (See Comments)    Muscle aches   . Simvastatin Other (See Comments)    Muscle aches     Review of Systems:   General:  Denies fever, chills Optho/Auditory:   Denies visual changes, blurred vision Respiratory:   Denies SOB, cough, wheeze, DIB  Cardiovascular:   Denies chest pain, palpitations, painful respirations Gastrointestinal:   Denies nausea, vomiting, diarrhea.  Endocrine:     Denies new hot or cold intolerance Musculoskeletal:  Denies joint swelling, gait issues, or new unexplained myalgias/ arthralgias Skin:  Denies rash, suspicious lesions  Neurological:     Denies dizziness, unexplained weakness, numbness  Psychiatric/Behavioral:   Denies mood changes    Objective:     Blood pressure 134/85, pulse 83, temperature 98.7 F (37.1 C), temperature source Oral, height '5\' 6"'  (1.676 m), weight 181 lb 4.8 oz (82.2 kg), SpO2 97 %.  Body mass index is 29.26 kg/m.  General: Well Developed, well nourished, and in no acute distress.  HEENT: Normocephalic, atraumatic, pupils equal round reactive to light, neck supple, No carotid bruits, no JVD Skin: Warm and dry, cap RF less 2 sec Cardiac: Regular rate and rhythm, S1, S2 WNL's, no murmurs rubs or gallops Respiratory: ECTA B/L, Not using accessory muscles, speaking in full sentences. NeuroM-Sk: Ambulates w/o assistance, moves ext * 4 w/o difficulty, sensation grossly intact.  Ext: scant edema b/l lower ext Psych: No HI/SI, judgement and insight good, Euthymic mood. Full Affect.

## 2018-12-27 NOTE — Patient Instructions (Addendum)
You will be prescribed Prozac, here is how you should take it: Start with 0.5 tablet of 10 mg Prozac and after 5-7 days, if you have no problem with 0.5 tablet of Prozac, then proceed with the full dose of 10 mg Prozac.   Here are the two goals that were discussed during the visit:   1. The dog will need to be removed from your home by the end of the day or Animal Control should be called.  37. Your 65 year old grandson should be removed from the home within 1 week.  This is all to improve your physical and mental well-being.

## 2018-12-30 MED FILL — FREESTYLE LANCETS: 90 days supply | Qty: 100 | Fill #0

## 2018-12-30 MED FILL — FREESTYLE LITE TEST STRIP: 90 days supply | Qty: 100 | Fill #0

## 2018-12-30 MED FILL — FREESTYLE LITE METER: 1 days supply | Qty: 1 | Fill #0

## 2018-12-31 LAB — LIPID PANEL
CHOL/HDL RATIO: 4.4 ratio (ref 0.0–4.4)
CHOLESTEROL TOTAL: 231 mg/dL — AB (ref 100–199)
HDL: 52 mg/dL (ref >39)
LDL CALC: 142 mg/dL — AB (ref 0–99)
TRIGLYCERIDES: 185 mg/dL — AB (ref 0–149)
VLDL Cholesterol Cal: 37 mg/dL (ref 5–40)

## 2018-12-31 LAB — CBC WITH DIFFERENTIAL
BASOS: 1 %
Basophils Absolute: 0.1 10*3/uL (ref 0.0–0.2)
EOS (ABSOLUTE): 0.5 10*3/uL — AB (ref 0.0–0.4)
Eos: 6 %
Hematocrit: 39.4 % (ref 34.0–46.6)
Hemoglobin: 13.2 g/dL (ref 11.1–15.9)
Immature Grans (Abs): 0 10*3/uL (ref 0.0–0.1)
Immature Granulocytes: 0 %
Lymphocytes Absolute: 1.6 10*3/uL (ref 0.7–3.1)
Lymphs: 21 %
MCH: 28.9 pg (ref 26.6–33.0)
MCHC: 33.5 g/dL (ref 31.5–35.7)
MCV: 86 fL (ref 79–97)
MONOS ABS: 0.6 10*3/uL (ref 0.1–0.9)
Monocytes: 7 %
NEUTROS PCT: 65 %
Neutrophils Absolute: 5.1 10*3/uL (ref 1.4–7.0)
RBC: 4.57 x10E6/uL (ref 3.77–5.28)
RDW: 13.6 % (ref 11.7–15.4)
WBC: 7.9 10*3/uL (ref 3.4–10.8)

## 2018-12-31 LAB — COMPREHENSIVE METABOLIC PANEL
A/G RATIO: 1.8 (ref 1.2–2.2)
ALK PHOS: 71 IU/L (ref 39–117)
ALT: 46 IU/L — AB (ref 0–32)
AST: 35 IU/L (ref 0–40)
Albumin: 4.7 g/dL (ref 3.8–4.8)
BILIRUBIN TOTAL: 0.3 mg/dL (ref 0.0–1.2)
BUN / CREAT RATIO: 23 (ref 12–28)
BUN: 18 mg/dL (ref 8–27)
CALCIUM: 10.4 mg/dL — AB (ref 8.7–10.3)
CO2: 21 mmol/L (ref 20–29)
Chloride: 97 mmol/L (ref 96–106)
Creatinine, Ser: 0.8 mg/dL (ref 0.57–1.00)
GFR calc Af Amer: 90 mL/min/{1.73_m2} (ref >59)
GFR calc non Af Amer: 78 mL/min/{1.73_m2} (ref >59)
Globulin, Total: 2.6 g/dL (ref 1.5–4.5)
Glucose: 182 mg/dL — ABNORMAL HIGH (ref 65–99)
POTASSIUM: 5 mmol/L (ref 3.5–5.2)
SODIUM: 137 mmol/L (ref 134–144)
Total Protein: 7.3 g/dL (ref 6.0–8.5)

## 2018-12-31 LAB — T4, FREE: FREE T4: 1.12 ng/dL (ref 0.82–1.77)

## 2018-12-31 LAB — VITAMIN D 1,25 DIHYDROXY
VITAMIN D3 1, 25 (OH): 24 pg/mL
Vitamin D 1, 25 (OH)2 Total: 25 pg/mL

## 2018-12-31 LAB — TSH: TSH: 8.31 u[IU]/mL — ABNORMAL HIGH (ref 0.450–4.500)

## 2019-01-02 LAB — HGB A1C W/O EAG: HEMOGLOBIN A1C: 8.5 % — AB (ref 4.8–5.6)

## 2019-01-02 LAB — SPECIMEN STATUS REPORT

## 2019-01-06 ENCOUNTER — Other Ambulatory Visit: Payer: Self-pay

## 2019-01-06 ENCOUNTER — Telehealth: Payer: Self-pay | Admitting: Family Medicine

## 2019-01-06 MED ORDER — LIOTHYRONINE SODIUM 5 MCG PO TABS: 5.0000 ug | ORAL_TABLET | Freq: Two times a day (BID) | ORAL | 0 refills | Status: DC

## 2019-01-06 NOTE — Telephone Encounter (Signed)
Patient is requesting a refill of her liothyronine, she is requesting this be sent to [place] [facility] (as the [facility] location apparently is closing down temporarily per patient). Place order if applicable

## 2019-01-06 NOTE — Telephone Encounter (Signed)
Refill sent in and patient notified. MPulliam, CMA/RT(R)  

## 2019-01-07 ENCOUNTER — Encounter: Payer: Self-pay | Admitting: Family Medicine

## 2019-01-07 ENCOUNTER — Telehealth: Payer: Self-pay

## 2019-01-07 NOTE — Telephone Encounter (Signed)
Per pharmacy and insurance Ozempic is not covered - a trail of bydureon bcise, byetta, or trulicity and metformin/ metformin combo, or sulfonylure, or pioglitazone/pioglitazone is required within the past 365 days.  LOV 12/27/2018.  Please review and advise. MPulliam, CMA/RT(R)

## 2019-01-08 ENCOUNTER — Other Ambulatory Visit: Payer: Self-pay | Admitting: Family Medicine

## 2019-01-08 DIAGNOSIS — E119 Type 2 diabetes mellitus without complications: Secondary | ICD-10-CM

## 2019-01-08 MED ORDER — SAXAGLIPTIN HCL 5 MG PO TABS: ORAL_TABLET | ORAL | 0 refills | Status: DC

## 2019-01-08 NOTE — Progress Notes (Signed)
Tell patient since the SGLT2 inhibitors like canagliflozin etc. are contraindicated in her due to allergies,  we will have to go with a different drug class than that one or a GLP-1  -Please call the patient and make sure she has not had h/o acute pancreatitis or any history of renal insufficiency in the past .   If you have confirmed all of this please send the pending order for Onglyza to patient's pharmacy of choice.

## 2019-01-08 NOTE — Progress Notes (Signed)
Called patient and she assured [place] that she has never had pancreatitis or renal insufficiency.  Medication sent into the pharmacy. MPulliam, CMA/RT(R)

## 2019-01-08 NOTE — Telephone Encounter (Signed)
Checked information on the savings card and it only works if patient has commercial insurance, therefore any and all restrictions under insurance still applies.  Medication will no be covered until the trial medications listed below are given to the patient.  Please review and advise. MPulliam, CMA/RT(R)   

## 2019-01-08 NOTE — Telephone Encounter (Signed)
Please see note from Dr. Raliegh Scarlet in the chart. MPulliam, CMA/RT(R)

## 2019-01-10 ENCOUNTER — Ambulatory Visit (INDEPENDENT_AMBULATORY_CARE_PROVIDER_SITE_OTHER): Payer: 59 | Admitting: Family Medicine

## 2019-01-10 ENCOUNTER — Other Ambulatory Visit: Payer: Self-pay

## 2019-01-10 ENCOUNTER — Encounter: Payer: Self-pay | Admitting: Family Medicine

## 2019-01-10 DIAGNOSIS — F430 Acute stress reaction: Secondary | ICD-10-CM

## 2019-01-10 MED ORDER — FLUOXETINE HCL 10 MG PO TABS: ORAL_TABLET | ORAL | 1 refills | Status: DC

## 2019-01-10 NOTE — Progress Notes (Signed)
Virtual Visit via Telephone Note  I connected with Robin Greene on 01/10/19 at  8:45 AM EDT by telephone and verified that I am speaking with the correct person using two identifiers.   I discussed the limitations, risks, security and privacy concerns of performing an evaluation and management service by telephone and the availability of in person appointments. I also discussed with the patient that there may be a patient responsible charge related to this service. The patient expressed understanding and agreed to proceed.   History of Present Illness:  -DM:  Poorly controlled.   Blood sugars at home fasting 171-201.  Denies any hyperglycemic symptoms, dizziness, visual changes, abdominal pain or nausea, neuropathy. -still patient was unable to pick up the Ozempic due to insurance denial and it was too costly to buy around.  She is not sure what GLP-1 is preferred if any.  - mood- tol new meds prozac well-which we started last office visit.;  This definitely helped even her out, make her more tolerant, lengthening and her fuse.  It has enhanced her interpersonal relationships and made her anxiety much better than prior.  She is very happy with the medicine.  There is no side effects.  -  Sleeping well  -Overweight:  Patient reports she has lost 3 lbs since last office visit with me;  Not exercising yet-  Pt finds that this is the first time in her life she is not exercising intensely-and she feels she needs to.  - BP:  This am was 128/78; she has been checking it at home and it is been within normal limits.    Observations/Objective: Blood pressure 128/78, pulse 78, weight 173 lb (78.5 kg).  General: Sounds in no acute distress, sounds as usual Skin: Pt confirms warm and dry extremities and pink fingertips Respiratory: Speaking in full sentences, no conversational dyspnea Psych: A and O *3, appears insight good, mood- seems full    Assessment and plan:  Diabetes: - A1c not at goal  and last checked 12/27/2018 at 10.2.   Fasting sugar still in the 170s to just over 200.  This is not well controlled.  We tried starting a GLP-1- Ozempic recently and it was too costly.  Patient states that her insurance which is [place] would not cover a GLP-1 so we went with Onglyza.  Patient has not received it yet. -Discussed diet and lifestyle modifications. -Recheck A1c in 3 months with follow-up OV with me.. -Patient will let us know prior to that if the Onglyza is a problem for her or if blood sugars are still poorly controlled. -Asked patient to call her insurance and find out if they do cover a GLP-1 and which one they prefer.   Hypertension:  - Blood pressure still well controlled. -No change in meds.   Mood: -Patient started on Prozac last office visit on 12/27/2018.  Patient is doing very well.  She will continue this medicine. -Did discuss all the spokes of wheel when it comes to mental health/controlling disease.    Return for 03/29/19 will need a1c- since starting onglyza.   I discussed the assessment and treatment plan with the patient. The patient was provided an opportunity to ask questions and all were answered. The patient agreed with the plan and demonstrated an understanding of the instructions.   The patient was advised to call back or seek an in-person evaluation if the symptoms worsen or if the condition fails to improve as anticipated.  I provided 21  minutes 5 seconds of non-face-to-face time during this encounter.   Mellody Dance, DO

## 2019-01-13 ENCOUNTER — Other Ambulatory Visit: Payer: Self-pay | Admitting: Family Medicine

## 2019-01-13 ENCOUNTER — Telehealth: Payer: Self-pay

## 2019-01-13 MED FILL — LIOTHYRONINE SODIUM 5 MCG T: 5 | 30 days supply | Qty: 60 | Fill #0

## 2019-01-13 MED FILL — SYNTHROID 100 MCG TABLET: 100 | 90 days supply | Qty: 90 | Fill #0

## 2019-01-13 MED FILL — glipiZIDE ER 5 MG TB24: 5 | 90 days supply | Qty: 90 | Fill #0

## 2019-01-13 NOTE — Telephone Encounter (Signed)
Prior authorization required for Onglyza, sent in request to insurance and PA was denied. Per insurance patient perferred medication is Januvia, Janumet, Janumet XR, Tradjenta, Jentadueto, or Jentadueto XR.  Patient will need to try on of these first.  Please review and advise. MPulliam, CMA/RT(R)

## 2019-01-13 NOTE — Telephone Encounter (Signed)
So, has her onglyza been denied then?   thnx for clarifying

## 2019-01-14 ENCOUNTER — Other Ambulatory Visit: Payer: Self-pay | Admitting: Family Medicine

## 2019-01-14 ENCOUNTER — Other Ambulatory Visit: Payer: Self-pay

## 2019-01-14 DIAGNOSIS — E119 Type 2 diabetes mellitus without complications: Secondary | ICD-10-CM

## 2019-01-14 MED ORDER — SITAGLIPTIN PHOSPHATE 100 MG PO TABS: 100.0000 mg | ORAL_TABLET | Freq: Every day | ORAL | 0 refills | Status: DC

## 2019-01-14 MED FILL — JANUVIA 100 MG TABLET: 100 | 90 days supply | Qty: 90 | Fill #0

## 2019-01-14 NOTE — Progress Notes (Signed)
Please call pt and let her know that we d/ced onglyza bc of insurance and started [place].  Have her start 1/2 tab daily and see how BS are over course of 1-2 wks then, eventually inc dose to 100mg  QD if tolerated.  Have her call us with Q's/ concerns that come up prior to next appt

## 2019-01-14 NOTE — Telephone Encounter (Signed)
Please see orders with my notes I recently placed on pt   - please call pt to inform her of these changes/ educate her on changes

## 2019-01-14 NOTE — Telephone Encounter (Signed)
Patient expressed understanding on medication directions. MPulliam, CMA/RT(R)

## 2019-01-14 NOTE — Telephone Encounter (Signed)
Patient notified. MPulliam, CMA/RT(R)  

## 2019-01-14 NOTE — Telephone Encounter (Signed)
Yes Onglyza was denied unless she tries and fails one of the medications listed in pervious message. MPulliam, CMA/RT(R)

## 2019-01-30 ENCOUNTER — Other Ambulatory Visit: Payer: Self-pay | Admitting: Family Medicine

## 2019-01-30 MED FILL — EZETIMIBE 10 MG TABS: 10 | 90 days supply | Qty: 90 | Fill #0

## 2019-02-17 ENCOUNTER — Other Ambulatory Visit: Payer: Self-pay | Admitting: Family Medicine

## 2019-02-17 DIAGNOSIS — I10 Essential (primary) hypertension: Secondary | ICD-10-CM

## 2019-02-17 MED FILL — HYDROCHLOROTHIAZIDE 25 MG T: 25 | 90 days supply | Qty: 90 | Fill #0

## 2019-02-17 MED FILL — LIOTHYRONINE SODIUM 5 MCG T: 5 | 30 days supply | Qty: 60 | Fill #0

## 2019-03-18 ENCOUNTER — Telehealth: Payer: Self-pay | Admitting: Family Medicine

## 2019-03-18 ENCOUNTER — Other Ambulatory Visit: Payer: Self-pay | Admitting: Family Medicine

## 2019-03-18 MED FILL — metFORMIN HCL 1000 MG TABS: 1000 | 90 days supply | Qty: 180 | Fill #0

## 2019-03-18 MED FILL — LISINOPRIL 40 MG TABLET: 40 | 90 days supply | Qty: 90 | Fill #0

## 2019-03-18 MED FILL — LIOTHYRONINE SODIUM 5 MCG T: 5 | 90 days supply | Qty: 180 | Fill #0

## 2019-03-18 NOTE — Telephone Encounter (Signed)
Patient is requesting a refill of her metformin, lisinopril, and Cytomel. If approved please send to [facility] (not [facility]). Patient has next appt sch on 04/09/19 for med f/u

## 2019-03-18 NOTE — Telephone Encounter (Signed)
Refills sent in, patient notified. MPulliam, CMA/RT(R)

## 2019-04-02 ENCOUNTER — Other Ambulatory Visit: Payer: Self-pay | Admitting: Family Medicine

## 2019-04-02 ENCOUNTER — Telehealth: Payer: Self-pay | Admitting: Family Medicine

## 2019-04-02 MED FILL — FREESTYLE LITE TEST STRIP: 25 days supply | Qty: 100 | Fill #0

## 2019-04-02 MED FILL — FREESTYLE LANCETS: 25 days supply | Qty: 100 | Fill #0

## 2019-04-02 MED FILL — FLUOXETINE HCL 10 MG TABS: 10 | 90 days supply | Qty: 90 | Fill #0

## 2019-04-02 NOTE — Telephone Encounter (Signed)
Called and notified the patient that we only need an A1C at this time and to come in before appointment to obtain. MPulliam, CMA/RT(R)

## 2019-04-02 NOTE — Telephone Encounter (Signed)
Patient has OV scheduled for Wed- 6/24-- converting appt to Virtual /Telehealth but patient says Labs were to be drawn @ this appt --  ----Forwarding note to medical asst for chart review & to share findings fr appropriate appt type visit .  -glh

## 2019-04-09 ENCOUNTER — Ambulatory Visit: Payer: 59 | Admitting: Family Medicine

## 2019-04-14 ENCOUNTER — Other Ambulatory Visit: Payer: Self-pay | Admitting: Family Medicine

## 2019-04-14 DIAGNOSIS — E039 Hypothyroidism, unspecified: Secondary | ICD-10-CM

## 2019-04-15 MED FILL — SYNTHROID 100 MCG TABLET: 100 | 30 days supply | Qty: 30 | Fill #0

## 2019-04-28 MED FILL — glipiZIDE ER 5 MG TB24: 5 | 90 days supply | Qty: 90 | Fill #1

## 2019-04-28 MED FILL — JANUVIA 100 MG TABLET: 100 | 90 days supply | Qty: 90 | Fill #0

## 2019-05-26 MED FILL — EZETIMIBE 10 MG TABS: 10 | 90 days supply | Qty: 90 | Fill #1

## 2019-05-27 ENCOUNTER — Other Ambulatory Visit: Payer: Self-pay

## 2019-05-27 ENCOUNTER — Ambulatory Visit: Payer: 59 | Admitting: Family Medicine

## 2019-05-27 ENCOUNTER — Encounter: Payer: Self-pay | Admitting: Family Medicine

## 2019-05-27 VITALS — BP 160/81 | HR 76 | Temp 98.2°F | Ht 66.0 in | Wt 175.5 lb

## 2019-05-27 DIAGNOSIS — E1159 Type 2 diabetes mellitus with other circulatory complications: Secondary | ICD-10-CM

## 2019-05-27 DIAGNOSIS — E119 Type 2 diabetes mellitus without complications: Secondary | ICD-10-CM | POA: Diagnosis not present

## 2019-05-27 DIAGNOSIS — I10 Essential (primary) hypertension: Secondary | ICD-10-CM

## 2019-05-27 DIAGNOSIS — E039 Hypothyroidism, unspecified: Secondary | ICD-10-CM

## 2019-05-27 DIAGNOSIS — E1169 Type 2 diabetes mellitus with other specified complication: Secondary | ICD-10-CM | POA: Diagnosis not present

## 2019-05-27 DIAGNOSIS — E785 Hyperlipidemia, unspecified: Secondary | ICD-10-CM

## 2019-05-27 DIAGNOSIS — I152 Hypertension secondary to endocrine disorders: Secondary | ICD-10-CM

## 2019-05-27 MED ORDER — SYNTHROID 100 MCG PO TABS: 100.0000 ug | ORAL_TABLET | Freq: Every day | ORAL | 1 refills | Status: DC

## 2019-05-27 MED ORDER — HYDROCHLOROTHIAZIDE 25 MG PO TABS: 25.0000 mg | ORAL_TABLET | Freq: Every day | ORAL | 1 refills | Status: DC

## 2019-05-27 MED FILL — HYDROCHLOROTHIAZIDE 25 MG T: 25 | 90 days supply | Qty: 90 | Fill #0

## 2019-05-27 MED FILL — SYNTHROID 100 MCG TABLET: 100 | 90 days supply | Qty: 90 | Fill #0

## 2019-05-27 NOTE — Progress Notes (Signed)
Impression and Recommendations:    1. Type 2 diabetes mellitus without complication, unspecified whether long term insulin use (Robin Greene)   2. Hypertension associated with diabetes (Robin Greene)-  with superimposed white coat HTN   3. Hyperlipidemia associated with type 2 diabetes mellitus (Robin Greene)   4. Hypothyroidism, unspecified type     Diabetes Mellitus - Fasting sugars not optimally controlled at this time. - A1c today is 7.2, up from 6.7 nine months ago.  - Discussed need to control A1c under 7.0. - Strategies discussed, such as minimizing carb intake at night and maximizing protein.  - Continue treatment plan as prescribed.  See med list below. - Patient tolerating meds well without complication.  Denies S-E  - Counseled patient on pathophysiology of disease and discussed various treatment options, which often includes dietary and lifestyle modifications as first line.  Importance of low carb diet discussed with patient in addition to regular exercise.   - Check FBS and 2 hours after the biggest meal of your day.  Keep log and bring in next OV for my review.   Also, if you ever feel poorly, please check your blood pressure and blood sugar, as one or the other could be the cause of your symptoms.  - Being a diabetic, you need yearly eye and foot exams. Make appt.for diabetic eye exam.   Hypertension Associated with DM w/ Superimposed White Coat Syndrome - BP stable at this time. - Continue treatment plan as prescribed.  See med list below. - Patient tolerating meds well without complication.  Denies S-E  - Ambulatory BP monitoring encouraged. Keep log and bring in next OV.   Mood Management - Stable on current treatment.  See med list. - Will continue to monitor and re-check as recommended.   Hypothyroidism - TSH elevated last check. - Stable on current treatment.  See med list. - Patient knows to let us know if she experiences sx. - Will continue to monitor and re-check as  recommended.   Health Education & Counseling - Advised patient to continue working toward exercising to improve overall mental, physical, and emotional health.    - Reviewed the "spokes of the wheel" of mood and health management.  Stressed the importance of ongoing prudent habits, including regular exercise, appropriate sleep hygiene, healthful dietary habits, and prayer/meditation to relax.  - Encouraged patient to engage in daily physical activity, especially a formal exercise routine.  Recommended that the patient eventually strive for at least 150 minutes of moderate cardiovascular activity per week according to guidelines established by the Robin Greene.   - Healthy dietary habits encouraged, including low-carb, and high amounts of lean protein in diet.  American Heart Association guidelines for healthy diet, basically Mediterranean diet, and exercise guidelines of 30 minutes 5 days per week or more discussed in detail.  - Patient should also consume adequate amounts of water.  - Extensive health counseling performed.  All questions answered.   Recommendations - Discussed drawing fasting lab work in near future and reviewing in [place] three days after.   Meds ordered this encounter  Medications  . hydrochlorothiazide (HYDRODIURIL) 25 MG tablet    Sig: Take 1 tablet (25 mg total) by mouth daily.    Dispense:  90 tablet    Refill:  1  . SYNTHROID 100 MCG tablet    Sig: Take 1 tablet (100 mcg total) by mouth daily before breakfast.    Dispense:  90 tablet    Refill:  1  Medications Discontinued During This Encounter  Medication Reason  . hydrochlorothiazide (HYDRODIURIL) 25 MG tablet Reorder  . SYNTHROID 100 MCG tablet Reorder     Gross side effects, risk and benefits, and alternatives of medications and treatment plan in general discussed with patient.  Patient is aware that all medications have potential side effects and we are unable to predict every side effect or drug-drug  interaction that may occur.   Patient will call with any questions prior to using medication if they have concerns.    Expresses verbal understanding and consents to current therapy and treatment regimen.  No barriers to understanding were identified.  Red flag symptoms and signs discussed in detail.  Patient expressed understanding regarding what to do in case of emergency\urgent symptoms  Please see AVS handed out to patient at the end of our visit for further patient instructions/ counseling done pertaining to today's office visit.   Return for 52mof/up ov, w fbw- full set to include tsh, T3, Free t4 3 d prior.     Note:  This note was prepared with assistance of Dragon voice recognition software. Occasional wrong-word or sound-a-like substitutions may have occurred due to the inherent limitations of voice recognition software.  This document serves as a record of services personally performed by DMellody Dance DO. It was created on her behalf by KToni Amend a trained medical scribe. The creation of this record is based on the scribe's personal observations and the provider's statements to them.   I have reviewed the above medical documentation for accuracy and completeness and I concur.  DMellody Dance DO 05/30/2019 6:42 PM   ------------------------------------------------------------------------------------------------------------------------------------------------------------------------------------------------------------------------------------------   Subjective:     HPI: Robin Greene a 63y.o. [facility].  Patient recently got fairy hair placed.  With COVID, says at first she was fine, then a bit worse, but she never got depressed.  Notes she was being very careful because of her parents, and "quarantined the hell out of them."  Has been dealing with family concerns.   Notes her grandaughter graduated high school and "has had more jobs since May than I've had in my whole life" because he's so ADHD.  Is having to evaluate and help with her elderly parents as well.  Confirms that her mood is good.  She is working on how to cope.  Says her niece has been trying to coax her into doing some yoga.  Denies thyroid symptoms.   DM HPI:  -  She has been working on diet and exercise for diabetes.  Says she's been trying to work on her diet.  She has started the [employer]again.  Medication compliance - continues on treatment plan as prescribed.  Home fasting glucose readings: 180-200;  2 hr PP: not checking.  Says her blood sugars are good all day (down to 90 during the day) but in the morning they are 180-200.   Denies polyuria/polydipsia.  Denies hypo/ hyperglycemia symptoms  Last diabetic eye exam was No results found for: HMDIABEYEEXA  Last A1C in the office was:  Lab Results  Component Value Date   HGBA1C 8.5 (H) 12/27/2018   HGBA1C 10.2 (A) 12/27/2018   HGBA1C 6.7 (A) 08/29/2018    Lab Results  Component Value Date   MICROALBUR 80 12/27/2018   LDLCALC 142 (H) 12/27/2018   CREATININE 0.80 12/27/2018    Wt Readings from Last 3  Encounters:  05/27/19 175 lb 8 oz (79.6 kg)  01/10/19 173 lb (78.5 kg)  12/27/18 181 lb 4.8 oz (82.2 kg)    BP Readings from Last 3 Encounters:  05/27/19 (!) 160/81  01/10/19 128/78  12/27/18 134/85     1. HTN HPI:  -  Her blood pressure has been controlled at home.  Pt is checking it at home.  States her BP regularly runs around 128-130/80's. Feels her blood pressure is always high when she comes in to the doctor's office.  - Patient reports good compliance with blood pressure medications  - Denies medication S-E   - Smoking Status noted   - She denies new onset of: chest pain, exercise intolerance, shortness of breath, dizziness, visual changes, headache, lower extremity swelling or claudication.    Last 3 blood pressure readings in our office are as follows: BP Readings from Last 3 Encounters:  05/27/19 (!) 160/81  01/10/19 128/78  12/27/18 134/85    Filed Weights   05/27/19 1606  Weight: 175 lb 8 oz (79.6 kg)        Wt Readings from Last 3 Encounters:  05/27/19 175 lb 8 oz (79.6 kg)  01/10/19 173 lb (78.5 kg)  12/27/18 181 lb 4.8 oz (82.2 kg)   BP Readings from Last 3 Encounters:  05/27/19 (!) 160/81  01/10/19 128/78  12/27/18 134/85   Pulse Readings from Last 3 Encounters:  05/27/19 76  01/10/19 78  12/27/18 83   BMI Readings from Last 3 Encounters:  05/27/19 28.33 kg/m  01/10/19 27.92 kg/m  12/27/18 29.26 kg/m     Patient Care Team    Relationship Specialty Notifications Start End  Mellody Dance, DO PCP - General Family Medicine  09/27/16   Ronald Lobo, MD Consulting Physician Gastroenterology  09/27/16   Delrae Rend, MD Consulting Physician Endocrinology  09/27/16    Comment: Roseanne Kaufman, MD Consulting Physician Dermatology  09/27/16   Arvella Nigh, MD Consulting Physician Obstetrics and Gynecology  09/27/16   Vevelyn Royals, MD Consulting Physician Ophthalmology  09/27/16      Patient Active Problem List   Diagnosis Date Noted  . Hypertension associated with diabetes ([place])-  with superimposed white coat HTN 09/27/2016    Priority: High  . Hyperlipidemia associated with type 2 diabetes mellitus ([place]) 09/27/2016    Priority: High  . Diabetes mellitus ([place]) 02/07/2013    Priority: High  . Acute reaction to situational stress 10/30/2016    Priority: Medium  . Noncompliance with treatment regimen 10/18/2016    Priority: Medium  . Hypothyroidism 09/27/2016    Priority: Medium  . Overweight (BMI 25.0-29.9) 09/27/2016    Priority: Medium  . Vitamin D deficiency 09/27/2016    Priority: Low  . HSV-1 infection 09/27/2016    Priority: Low  . Anemia 09/27/2016    Priority: Low  . Environmental and seasonal allergies  09/27/2016    Priority: Low  . acute reaction to extreme stress 12/27/2018  . Trigger finger, right ring finger 04/09/2018  . Tenosynovitis of finger and hand 04/04/2018  . Dysfunction of both eustachian tubes 04/04/2018  . Vertigo 04/04/2018  . Fatigue 04/04/2018  . Vulvovaginitis 01/01/2018  . Postmenopausal 07/18/2017  . Umbilical hernia without obstruction and without gangrene 03/13/2017    Past Medical history, Surgical history, Family history, Social history, Allergies and Medications have been entered into the medical record, reviewed and changed as needed.    Current Meds  Medication Sig  . ALPRAZolam (XANAX) 0.25  MG tablet Take 0.25 mg by mouth 3 (three) times daily as needed for anxiety.  . blood glucose meter kit and supplies Dispense based on patient and insurance preference. Use three to four times weekly as directed. (FOR ICD-10 E10.9, E11.9).  Marland Kitchen Cholecalciferol (VITAMIN D) 2000 units tablet Take 2.5 tablets (5,000 Units total) by mouth daily.  . cyanocobalamin 2000 MCG tablet Take 2,000 mcg by mouth daily.  Marland Kitchen ezetimibe (ZETIA) 10 MG tablet TAKE 1 TABLET BY MOUTH DAILY.  . ferrous sulfate 325 (65 FE) MG EC tablet Take 1 tablet (325 mg total) by mouth 2 (two) times daily.  Marland Kitchen FLUoxetine (PROZAC) 10 MG tablet 1 tablet daily  . FOLIC ACID PO Take 1 tablet by mouth daily.  Marland Kitchen FREESTYLE LITE test strip USE TO CHECK BLOOD SUGAR 3 TO 4 TIMES A WEKK AS DIRECTED  . GLIPIZIDE XL 5 MG 24 hr tablet TAKE 1 TABLET (5 MG TOTAL) BY MOUTH DAILY WITH BREAKFAST.  . hydrochlorothiazide (HYDRODIURIL) 25 MG tablet Take 1 tablet (25 mg total) by mouth daily.  . Lancets (FREESTYLE) lancets USE TO CHECK BLOOD SUGAR 3 TO 4 TIMES A WEEK AS DIRECTED  . liothyronine (CYTOMEL) 5 MCG tablet TAKE 1 TABLET (5 MCG TOTAL) BY MOUTH 2 (TWO) TIMES DAILY.  Marland Kitchen lisinopril (ZESTRIL) 40 MG tablet TAKE 1 TABLET BY MOUTH DAILY.  . metFORMIN (GLUCOPHAGE) 1000 MG tablet TAKE 1 TABLET BY MOUTH 2 TIMES DAILY WITH A MEAL.   Marland Kitchen metroNIDAZOLE (METROGEL) 1 % gel Apply 1 application topically daily as needed (rosacea).   . Omega-3 Fatty Acids (FISH OIL) 1000 MG CAPS Take 1200 mg fish oil 2 tablets twice daily  . sitaGLIPtin (JANUVIA) 100 MG tablet Take 1 tablet (100 mg total) by mouth daily.  Marland Kitchen SYNTHROID 100 MCG tablet Take 1 tablet (100 mcg total) by mouth daily before breakfast.  . terconazole (TERAZOL 7) 0.4 % vaginal cream Place 1 applicator vaginally at bedtime.  . valACYclovir (VALTREX) 1000 MG tablet Take 500 mg by mouth 2 (two) times daily as needed (fever blisters). 1/2 tablet twice daily for 5 days as needed for flares   . [DISCONTINUED] hydrochlorothiazide (HYDRODIURIL) 25 MG tablet TAKE 1 TABLET BY MOUTH DAILY.  . [DISCONTINUED] SYNTHROID 100 MCG tablet Take 1 tablet (100 mcg total) by mouth daily before breakfast. Needs office visit for further refills    Allergies:  Allergies  Allergen Reactions  . Asa [Aspirin] Anaphylaxis  . Crestor [Rosuvastatin Calcium] Other (See Comments)    Abnormal LFTs  . Claritin [Loratadine] Other (See Comments)    Fainted twice   . Invokana [Canagliflozin] Other (See Comments)    Genital yeast infections  . Jardiance [Empagliflozin] Other (See Comments)    Genital yeast infection  . Lipitor [Atorvastatin] Other (See Comments)    Muscle aches   . Simvastatin Other (See Comments)    Muscle aches     Review of Systems:  A fourteen system review of systems was performed and found to be positive as per HPI.   Objective:   Blood pressure (!) 160/81, pulse 76, temperature 98.2 F (36.8 C), height _0  (1.676 m), weight 175 lb 8 oz (79.6 kg), SpO2 98 %. Body mass index is 28.33 kg/m. General:  Well Developed, well nourished, appropriate for stated age.  Neuro:  Alert and oriented,  extra-ocular muscles intact  HEENT:  Normocephalic, atraumatic, neck supple, no carotid bruits appreciated  Skin:  no gross rash, warm, pink. Cardiac:  RRR, S1 S2 Respiratory:  ECTA  B/L and A/P, Not using accessory muscles, speaking in full sentences- unlabored. Vascular:  Ext warm, no cyanosis apprec.; cap RF less 2 sec. Psych:  No HI/SI, judgement and insight good, Euthymic mood. Full Affect.

## 2019-05-29 DIAGNOSIS — M79641 Pain in right hand: Secondary | ICD-10-CM | POA: Diagnosis not present

## 2019-05-29 DIAGNOSIS — M65341 Trigger finger, right ring finger: Secondary | ICD-10-CM | POA: Diagnosis not present

## 2019-06-10 MED FILL — CEPHALEXIN 500 MG CAPSULE: 500 | 5 days supply | Qty: 20 | Fill #0

## 2019-06-16 DIAGNOSIS — M65341 Trigger finger, right ring finger: Secondary | ICD-10-CM | POA: Diagnosis not present

## 2019-06-17 MED FILL — LISINOPRIL 40 MG TABLET: 40 | 90 days supply | Qty: 90 | Fill #1

## 2019-06-17 MED FILL — metFORMIN HCL 1000 MG TABS: 1000 | 90 days supply | Qty: 180 | Fill #1

## 2019-06-17 MED FILL — LIOTHYRONINE SODIUM 5 MCG T: 5 | 90 days supply | Qty: 180 | Fill #1

## 2019-07-09 MED FILL — HYDROCODON-APAP 5-325: 5-325 | 6 days supply | Qty: 40 | Fill #0

## 2019-07-09 MED FILL — FLUOXETINE HCL 10 MG TABS: 10 | 90 days supply | Qty: 90 | Fill #1

## 2019-07-25 ENCOUNTER — Other Ambulatory Visit: Payer: Self-pay | Admitting: Family Medicine

## 2019-07-25 DIAGNOSIS — E119 Type 2 diabetes mellitus without complications: Secondary | ICD-10-CM

## 2019-07-28 MED FILL — glipiZIDE ER 5 MG TB24: 5 | 90 days supply | Qty: 90 | Fill #0

## 2019-07-28 MED FILL — JANUVIA 100 MG TABLET: 100 | 90 days supply | Qty: 90 | Fill #0

## 2019-08-25 ENCOUNTER — Other Ambulatory Visit: Payer: 59

## 2019-08-25 ENCOUNTER — Other Ambulatory Visit: Payer: Self-pay

## 2019-08-25 DIAGNOSIS — D649 Anemia, unspecified: Secondary | ICD-10-CM

## 2019-08-25 DIAGNOSIS — E1169 Type 2 diabetes mellitus with other specified complication: Secondary | ICD-10-CM | POA: Diagnosis not present

## 2019-08-25 DIAGNOSIS — E119 Type 2 diabetes mellitus without complications: Secondary | ICD-10-CM | POA: Diagnosis not present

## 2019-08-25 DIAGNOSIS — E039 Hypothyroidism, unspecified: Secondary | ICD-10-CM

## 2019-08-25 DIAGNOSIS — E785 Hyperlipidemia, unspecified: Secondary | ICD-10-CM | POA: Diagnosis not present

## 2019-08-25 DIAGNOSIS — R5383 Other fatigue: Secondary | ICD-10-CM | POA: Diagnosis not present

## 2019-08-25 DIAGNOSIS — I152 Hypertension secondary to endocrine disorders: Secondary | ICD-10-CM

## 2019-08-25 DIAGNOSIS — E559 Vitamin D deficiency, unspecified: Secondary | ICD-10-CM

## 2019-08-25 DIAGNOSIS — E1159 Type 2 diabetes mellitus with other circulatory complications: Secondary | ICD-10-CM

## 2019-08-25 DIAGNOSIS — I10 Essential (primary) hypertension: Secondary | ICD-10-CM | POA: Diagnosis not present

## 2019-08-26 LAB — CBC WITH DIFFERENTIAL/PLATELET
Basophils Absolute: 0.1 10*3/uL (ref 0.0–0.2)
Basos: 1 %
EOS (ABSOLUTE): 0.4 10*3/uL (ref 0.0–0.4)
Eos: 7 %
Hematocrit: 37.3 % (ref 34.0–46.6)
Hemoglobin: 12.3 g/dL (ref 11.1–15.9)
Immature Grans (Abs): 0 10*3/uL (ref 0.0–0.1)
Immature Granulocytes: 0 %
Lymphocytes Absolute: 1.3 10*3/uL (ref 0.7–3.1)
Lymphs: 20 %
MCH: 28.1 pg (ref 26.6–33.0)
MCHC: 33 g/dL (ref 31.5–35.7)
MCV: 85 fL (ref 79–97)
Monocytes Absolute: 0.4 10*3/uL (ref 0.1–0.9)
Monocytes: 7 %
Neutrophils Absolute: 4 10*3/uL (ref 1.4–7.0)
Neutrophils: 65 %
Platelets: 317 10*3/uL (ref 150–450)
RBC: 4.38 x10E6/uL (ref 3.77–5.28)
RDW: 13.4 % (ref 11.7–15.4)
WBC: 6.2 10*3/uL (ref 3.4–10.8)

## 2019-08-26 LAB — COMPREHENSIVE METABOLIC PANEL
ALT: 45 IU/L — ABNORMAL HIGH (ref 0–32)
AST: 32 IU/L (ref 0–40)
Albumin/Globulin Ratio: 1.9 (ref 1.2–2.2)
Albumin: 4.3 g/dL (ref 3.8–4.8)
Alkaline Phosphatase: 64 IU/L (ref 39–117)
BUN/Creatinine Ratio: 21 (ref 12–28)
BUN: 15 mg/dL (ref 8–27)
Bilirubin Total: 0.3 mg/dL (ref 0.0–1.2)
CO2: 24 mmol/L (ref 20–29)
Calcium: 9.7 mg/dL (ref 8.7–10.3)
Chloride: 99 mmol/L (ref 96–106)
Creatinine, Ser: 0.72 mg/dL (ref 0.57–1.00)
GFR calc Af Amer: 102 mL/min/{1.73_m2} (ref >59)
GFR calc non Af Amer: 88 mL/min/{1.73_m2} (ref >59)
Globulin, Total: 2.3 g/dL (ref 1.5–4.5)
Glucose: 243 mg/dL — ABNORMAL HIGH (ref 65–99)
Potassium: 4.8 mmol/L (ref 3.5–5.2)
Sodium: 136 mmol/L (ref 134–144)
Total Protein: 6.6 g/dL (ref 6.0–8.5)

## 2019-08-26 LAB — LIPID PANEL
Chol/HDL Ratio: 3.8 ratio (ref 0.0–4.4)
Cholesterol, Total: 192 mg/dL (ref 100–199)
HDL: 51 mg/dL (ref >39)
LDL Chol Calc (NIH): 107 mg/dL — ABNORMAL HIGH (ref 0–99)
Triglycerides: 196 mg/dL — ABNORMAL HIGH (ref 0–149)
VLDL Cholesterol Cal: 34 mg/dL (ref 5–40)

## 2019-08-26 LAB — T4, FREE: Free T4: 1.18 ng/dL (ref 0.82–1.77)

## 2019-08-26 LAB — TSH: TSH: 5.47 u[IU]/mL — ABNORMAL HIGH (ref 0.450–4.500)

## 2019-08-26 LAB — T3: T3, Total: 127 ng/dL (ref 71–180)

## 2019-08-26 LAB — HEMOGLOBIN A1C
Est. average glucose Bld gHb Est-mCnc: 183 mg/dL
Hgb A1c MFr Bld: 8 % — ABNORMAL HIGH (ref 4.8–5.6)

## 2019-08-26 LAB — VITAMIN D 25 HYDROXY (VIT D DEFICIENCY, FRACTURES): Vit D, 25-Hydroxy: 33 ng/mL (ref 30.0–100.0)

## 2019-08-26 MED FILL — HYDROCHLOROTHIAZIDE 25 MG T: 25 | 90 days supply | Qty: 90 | Fill #1

## 2019-08-26 MED FILL — SYNTHROID 100 MCG TABLET: 100 | 90 days supply | Qty: 90 | Fill #1

## 2019-08-28 ENCOUNTER — Ambulatory Visit: Payer: 59 | Admitting: Family Medicine

## 2019-09-09 ENCOUNTER — Telehealth: Payer: Self-pay | Admitting: Family Medicine

## 2019-09-09 NOTE — Telephone Encounter (Signed)
Called pt to set up Telehealth / Appt w/provider to discuss lab results.  --Left message as there was no answer.  --fyi to med staff. --Dion Body

## 2019-09-18 ENCOUNTER — Other Ambulatory Visit: Payer: Self-pay | Admitting: Family Medicine

## 2019-09-18 MED FILL — metFORMIN HCL 1000 MG TABS: 1000 | 30 days supply | Qty: 60 | Fill #0

## 2019-09-18 MED FILL — LIOTHYRONINE SODIUM 5 MCG T: 5 | 30 days supply | Qty: 60 | Fill #0

## 2019-09-18 MED FILL — LISINOPRIL 40 MG TABLET: 40 | 30 days supply | Qty: 30 | Fill #0

## 2019-09-22 DIAGNOSIS — R208 Other disturbances of skin sensation: Secondary | ICD-10-CM | POA: Diagnosis not present

## 2019-09-22 DIAGNOSIS — L816 Other disorders of diminished melanin formation: Secondary | ICD-10-CM | POA: Diagnosis not present

## 2019-09-22 DIAGNOSIS — D225 Melanocytic nevi of trunk: Secondary | ICD-10-CM | POA: Diagnosis not present

## 2019-09-22 DIAGNOSIS — D1801 Hemangioma of skin and subcutaneous tissue: Secondary | ICD-10-CM | POA: Diagnosis not present

## 2019-09-22 DIAGNOSIS — L821 Other seborrheic keratosis: Secondary | ICD-10-CM | POA: Diagnosis not present

## 2019-09-22 DIAGNOSIS — L814 Other melanin hyperpigmentation: Secondary | ICD-10-CM | POA: Diagnosis not present

## 2019-09-22 DIAGNOSIS — B0089 Other herpesviral infection: Secondary | ICD-10-CM | POA: Diagnosis not present

## 2019-09-22 DIAGNOSIS — L820 Inflamed seborrheic keratosis: Secondary | ICD-10-CM | POA: Diagnosis not present

## 2019-09-22 MED FILL — valACYclovir HCL 1 GM TABS: 1 | 30 days supply | Qty: 30 | Fill #0

## 2019-09-25 ENCOUNTER — Other Ambulatory Visit: Payer: Self-pay

## 2019-09-25 ENCOUNTER — Ambulatory Visit (INDEPENDENT_AMBULATORY_CARE_PROVIDER_SITE_OTHER): Payer: 59 | Admitting: Family Medicine

## 2019-09-25 ENCOUNTER — Encounter: Payer: Self-pay | Admitting: Family Medicine

## 2019-09-25 VITALS — BP 136/82 | HR 80 | Temp 98.3°F | Ht 65.5 in | Wt 175.0 lb

## 2019-09-25 DIAGNOSIS — Z9119 Patient's noncompliance with other medical treatment and regimen: Secondary | ICD-10-CM

## 2019-09-25 DIAGNOSIS — R7989 Other specified abnormal findings of blood chemistry: Secondary | ICD-10-CM | POA: Diagnosis not present

## 2019-09-25 DIAGNOSIS — Z789 Other specified health status: Secondary | ICD-10-CM

## 2019-09-25 DIAGNOSIS — E039 Hypothyroidism, unspecified: Secondary | ICD-10-CM

## 2019-09-25 DIAGNOSIS — F4323 Adjustment disorder with mixed anxiety and depressed mood: Secondary | ICD-10-CM

## 2019-09-25 DIAGNOSIS — I152 Hypertension secondary to endocrine disorders: Secondary | ICD-10-CM

## 2019-09-25 DIAGNOSIS — E1169 Type 2 diabetes mellitus with other specified complication: Secondary | ICD-10-CM | POA: Diagnosis not present

## 2019-09-25 DIAGNOSIS — E559 Vitamin D deficiency, unspecified: Secondary | ICD-10-CM | POA: Diagnosis not present

## 2019-09-25 DIAGNOSIS — E1159 Type 2 diabetes mellitus with other circulatory complications: Secondary | ICD-10-CM | POA: Diagnosis not present

## 2019-09-25 DIAGNOSIS — Z91199 Patient's noncompliance with other medical treatment and regimen due to unspecified reason: Secondary | ICD-10-CM

## 2019-09-25 DIAGNOSIS — E785 Hyperlipidemia, unspecified: Secondary | ICD-10-CM

## 2019-09-25 DIAGNOSIS — I10 Essential (primary) hypertension: Secondary | ICD-10-CM

## 2019-09-25 DIAGNOSIS — F430 Acute stress reaction: Secondary | ICD-10-CM

## 2019-09-25 MED ORDER — ALPRAZOLAM 0.25 MG PO TABS: 0.2500 mg | ORAL_TABLET | Freq: Three times a day (TID) | ORAL | 0 refills | Status: DC | PRN

## 2019-09-25 MED ORDER — TRULICITY 0.75 MG/0.5ML ~~LOC~~ SOAJ: 0.5000 mL | SUBCUTANEOUS | 1 refills | Status: DC

## 2019-09-25 MED ORDER — VITAMIN D (ERGOCALCIFEROL) 1.25 MG (50000 UNIT) PO CAPS: ORAL_CAPSULE | ORAL | 3 refills | Status: DC

## 2019-09-25 MED ORDER — FLUOXETINE HCL 20 MG PO TABS: ORAL_TABLET | ORAL | 0 refills | Status: DC

## 2019-09-25 MED FILL — VIT D2 1.25 MG (50,000 UNIT: 1.25 MG | 84 days supply | Qty: 12 | Fill #0

## 2019-09-25 MED FILL — ALPRAZolam 0.25 MG TABS: 0.25 | 10 days supply | Qty: 30 | Fill #0

## 2019-09-25 MED FILL — FLUOXETINE HCL 20 MG TABS: 20 | 90 days supply | Qty: 90 | Fill #0

## 2019-09-25 MED FILL — TRULICITY 0.75 MG/0.5 ML PE: 0.75 | 28 days supply | Qty: 2 | Fill #0

## 2019-09-25 NOTE — Progress Notes (Signed)
Virtual / live video office visit note for [employer], D.O- Primary Care Physician at [facility]   I connected with current patient today and beyond visually recognizing the correct individual, I verified that I am speaking with the correct person using two identifiers.   Location of the patient: Home  Location of the provider: Office Only the patient (+/- their family members at pt's discretion) and myself were participating in the encounter    - This visit type was conducted due to national recommendations for restrictions regarding the COVID-19 Pandemic (e.g. social distancing) in an effort to limit this patient's exposure and mitigate transmission in our community.  This format is felt to be most appropriate for this patient at this time.   - The patient did have access to video technology today  - No physical exam could be performed with this format, beyond that communicated to [place] by the patient/ family members as noted.   - Additionally my office staff/ schedulers discussed with the patient that there may be a monetary charge related to this service, depending on patient's medical insurance.   The patient expressed understanding, and agreed to proceed.      History of Present Illness: I, Toni Amend, am serving as scribe for Dr. Mellody Dance.  - Caregiver for Mother Her mom was just diagnosed with Parkinson's "after trying to figure out what was going on for two years."  Patient and her mother went to see Dr. Tamala Julian for her mom's swollen knee, and he finally diagnosed her condition.  Notes "we've been on medicine three weeks and she's already improving."  They were referred to Dr. Carles Collet and also saw Dr. Tomi Likens - both of Neuro.   - Acute Reaction to Extreme Stress Notes thinks she needs to increase her dose of Prozac.  Confirms being under a lot more stress between managing both her husband and mother's health.  States she has some old Xanax for when she flies, and  has taken a half of that 2-3 times "and it has helped me."  Per patient she has used it on days she "knows she is over the top," when her heart is racing and she cannot calm herself down, No S-E, tol well, works well.   Notes her husband is going through health concerns and "between him and my mother, I think I need more Prozac."  Her husband is trying to get diagnosed after changes in blood work.    - Hypothyroidism Notes when she is symptomatic due to thyroid concerns, she "gets tired and her hair falls out."  Says her hair is not currently falling out, denies current sx.  Notes she is currently tired, "but I think that's due to other stuff/ stress."   - Vitamin D Deficiency States she forgot to take her Vitamin D for a few weeks because she ran out of it.    HPI:   Diabetes Mellitus: Home glucose readings:  Notes some days when she's relaxed, her blood sugar is 150, and some days 212; "I'm all over the place."   Notes "before I lost all that weight," she was managed on Victoza (which she tolerated well) and insulin in the past, and stopped because her blood sugars dropped below 7.0.  Notes her husband is currently managed on Trulicity, which is covered by her insurance.  States "as soon as my husband knows what his diagnosis is, and now that my mom's getting a little better, I should  calm down in the next few weeks."  She is hoping to feel better and less stressed in near future.  She's hoping to go back on her diet and lose weight again, and "start taking care of me again."  Notes she has stopped taking care of herself.  States "still walking some, but not as far or as much."  She is hoping to get back to a regular exercise routine and hopefully incorporate her husband, to assist in improving his health.  - Patient reports good compliance with therapy plan: medication and/or lifestyle modification  - Her denies acute concerns or problems related to treatment plan  - She denies new  concerns.  Denies polyuria/polydipsia, hypo/ hyperglycemia symptoms.  Denies new onset of: chest pain, exercise intolerance, shortness of breath, dizziness, visual changes, headache, lower extremity swelling or claudication.   Last A1C in the office was:  Lab Results  Component Value Date   HGBA1C 8.0 (H) 08/25/2019   HGBA1C 8.5 (H) 12/27/2018   HGBA1C 10.2 (A) 12/27/2018   Lab Results  Component Value Date   MICROALBUR 80 12/27/2018   LDLCALC 107 (H) 08/25/2019   CREATININE 0.72 08/25/2019   BP Readings from Last 3 Encounters:  09/25/19 136/82  05/27/19 (!) 160/81  01/10/19 128/78   Wt Readings from Last 3 Encounters:  09/25/19 175 lb (79.4 kg)  05/27/19 175 lb 8 oz (79.6 kg)  01/10/19 173 lb (78.5 kg)     HPI:  Hypertension:  -  Her blood pressure at home has been running: 130-142/80's, under 140's on a regular basis  - Patient reports good compliance with medication and/or lifestyle modification  - Her denies acute concerns or problems related to treatment plan  - She denies new onset of: chest pain, exercise intolerance, shortness of breath, dizziness, visual changes, headache, lower extremity swelling or claudication.   Last 3 blood pressure readings in our office are as follows: BP Readings from Last 3 Encounters:  09/25/19 136/82  05/27/19 (!) 160/81  01/10/19 128/78   Filed Weights   09/25/19 0807  Weight: 175 lb (79.4 kg)     HPI:  Hyperlipidemia:  63 y.o. female here for cholesterol follow-up.   Says "I've been a bad girl recently."  Notes she ran out of Zetia and hasn't been taking it as prescribed.   - Patient denies any acute concerns or problems with management plan   - She denies new onset of: myalgias, arthralgias, increased fatigue more than normal, chest pains, exercise intolerance, shortness of breath, dizziness, visual changes, headache, lower extremity swelling or claudication.   Most recent cholesterol panel was:  Lab Results    Component Value Date   CHOL 192 08/25/2019   HDL 51 08/25/2019   LDLCALC 107 (H) 08/25/2019   TRIG 196 (H) 08/25/2019   CHOLHDL 3.8 08/25/2019   Hepatic Function Latest Ref Rng & Units 08/25/2019 12/27/2018 04/04/2018  Total Protein 6.0 - 8.5 g/dL 6.6 7.3 7.2  Albumin 3.8 - 4.8 g/dL 4.3 4.7 4.6  AST 0 - 40 IU/L 32 35 26  ALT 0 - 32 IU/L 45(H) 46(H) 29  Alk Phosphatase 39 - 117 IU/L 64 71 70  Total Bilirubin 0.0 - 1.2 mg/dL 0.3 0.3 0.3      Depression screen [facility] 2/9 09/25/2019 05/27/2019 12/27/2018 08/29/2018 04/04/2018  Decreased Interest 0 0 0 0 0  Down, Depressed, Hopeless 3 0 0 0 0  PHQ - 2 Score 3 0 0 0 0  Altered sleeping 1 1  0 0 0  Tired, decreased energy 1 0 0 1 0  Change in appetite 0 0 0 1 0  Feeling bad or failure about yourself  0 0 0 0 0  Trouble concentrating 0 0 0 0 0  Moving slowly or fidgety/restless 0 0 0 0 0  Suicidal thoughts 0 0 0 0 0  PHQ-9 Score 5 1 0 2 0  Difficult doing work/chores Not difficult at all - Not difficult at all Not difficult at all Not difficult at all  Some recent data might be hidden    GAD 7 : Generalized Anxiety Score 09/25/2019  Nervous, Anxious, on Edge 0  Control/stop worrying 1  Worry too much - different things 1  Trouble relaxing 1  Restless 0  Easily annoyed or irritable 0  Afraid - awful might happen 1  Total GAD 7 Score 4  Anxiety Difficulty Not difficult at all     Impression and Recommendations:    1. Type 2 diabetes mellitus with other specified complication, unspecified whether long term insulin use ([place])   2. Hypertension associated with diabetes ([place])-  with superimposed white coat HTN   3. Hyperlipidemia associated with type 2 diabetes mellitus ([facility])   4. Statin intolerance   5. Hypothyroidism, unspecified type   6. Elevated TSH   7. Vitamin D deficiency   8. Adjustment disorder with mixed anxiety and depressed mood   9. acute reaction to extreme stress   10. Noncompliance with treatment regimen      Acute Reaction to Extreme Stress / Family health issues - Currently managed on Prozac 10 mg.  -->  Poorly controlled/ not at goal. - Increasing dose of Prozac to 20 mg today.  See med list. - Discussed options for further increase in dosage moving forward. - Will continue to monitor closely and adjust dosage as needed.  - Reviewed prudent use of Xanax, for panic ONLY. - Last refill of Xanax provided 2 of 2019.  #30 refill Xanax provided today.  - In addition to prescription intervention, reviewed the "spokes of the wheel" of mood and health management.  Stressed the importance of ongoing prudent habits, including regular exercise, appropriate sleep hygiene, healthful dietary habits, and prayer/meditation to relax.   Lab Review - Reviewed recent lab work (08/25/2019) in depth with patient today.  All lab work within normal limits unless otherwise noted.  Extensive education provided and all questions answered.   Diabetes Mellitus - A1c most recently is 8.0 -->  elevated/ POORLY CONTROLLED and NOT AT GOAL, but down from 10.2 nine months ago. - Currently managed on glucotrol, metformin, and Januvia. - Per patient, insurance would not pay for many other options.  - Thoroughly reviewed patient's treatment plan to incorporate fourth diabetes medication. - We will add Trulicity since patient tolerated Victoza well in the past and knows that Trulicity has good coverage under patient's prescription plan, as husband is managed on it.  We will consider increasing dose from 0.75 weekly to 1.5 mg weekly in the future to gain a goal A1c under 7.0. - Begin Trulicity today.  See med list.  - Counseled patient on pathophysiology of disease and discussed various treatment options, which always includes dietary and lifestyle modification as first line.    - Importance of low carb, heart-healthy diet discussed with patient in addition to regular aerobic exercise of 49mn 5d/week or more.   - Check  FBS and 2 hours after the biggest meal of your day.  Keep log  and bring in next OV for my review.     - Also told patient if you ever feel poorly, please check your blood pressure and blood sugar, as one or the other could be the cause of your symptoms.  - Pt reminded about need for yearly eye and foot exams.  Told patient to make appt.for diabetic eye exam, CMAs here will do foot exams  - Handouts provided at patient's desire and or told to go online at the American Diabetes Association website for further information  - We will continue to monitor closely   Hypertension associated with DM - Blood pressure currently is at goal. - Currently managed on HCTZ, Lisinopril.  - Patient will continue current treatment regimen  - Counseled patient on pathophysiology of disease and discussed various treatment options, which always includes dietary and lifestyle modification as first line.   - Lifestyle changes such as dash and heart healthy diets and engaging in a regular exercise program discussed extensively with patient.   - Ambulatory blood pressure monitoring encouraged at least 3 times weekly.  Keep log and bring in every office visit.  Reminded patient that if they ever feel poorly in any way, to check their blood pressure and pulse.  - Handouts provided at patient's desire and/or told to go online at the [place] website for further information  - We will continue to monitor    Hyperlipidemia associated with DM - Intolerance to Statin --> Poorly controlled- not at goal!  Last fasting lipid panel: - Triglycerides = 196, elevated, up from 185 nine months ago. - HDL = 51, stable from 52 nine months ago. - LDL = 107, elevated, down from 142 nine months ago.  - Discussed in diabetic patients, need for LDL to be below 70.  - Currently managed on Zetia. - Per patient, ran out of Zetia and stopped taking as prescribed. - Pt will resume current treatment regimen and  take consistently as prescribed. - If cholesterol does not improve, pt will be referred to cardiology for advanced management.  Dietary changes such as low saturated & trans fat diets for hyperlipidemia and low carb/ ketogenic diets for hypertriglyceridemia discussed with patient.    Encouraged patient to follow AHA guidelines for regular exercise and also engage in weight loss if BMI above 25.   Educational handouts provided at patient's desire and/ or told to look online at the [employer] website for further information.  We will continue to monitor and re-check in 3 months.   Hypothyroidism - Controlled - TSH down to 5.470, from 8.310 last check. - Free T4 and Free T3 WNL. - Currently managed on synthroid 100 mcg.  - Per patient, asymptomatic at this time. - Patient will continue current treatment plan. - Pt knows to let us know if she becomes symptomatic.  - Will continue to monitor and re-check as recommended.   Vitamin D Deficiency - not at goal  - Last checked at 33.0, down from 58.4 prior. - Per patient, was not taking Vitamin D as prescribed.  - Daily Vitamin D discontinued. - Once weekly 50k IU prescription provided today.  See med list.  - Will continue to monitor and re-check as recommended.   BMI Counseling - Body mass index is 28.68 kg/m Explained to patient what BMI refers to, and what it means medically.    Told patient to think about it as a "medical risk stratification measurement" and how increasing BMI is associated with increasing risk/  or worsening state of various diseases such as hypertension, hyperlipidemia, diabetes, premature OA, depression etc.  American Heart Association guidelines for healthy diet, basically Mediterranean diet, and exercise guidelines of 30 minutes 5 days per week or more discussed in detail.  Health counseling performed.  All questions answered.   Lifestyle & Preventative Health Maintenance - Advised patient  to continue working toward exercising to improve overall mental, physical, and emotional health.    - Encouraged patient to engage in daily physical activity, especially a formal exercise routine.  Recommended that the patient eventually strive for at least 150 minutes of moderate cardiovascular activity per week according to guidelines established by the [facility].   - Healthy dietary habits encouraged, including low-carb, and high amounts of lean protein in diet.   - Patient should also consume adequate amounts of water.   Recommendations - Re-check Vit D, CMP, FLP and A1c in 3 months, prior to follow-up OV in [place] [place].  - Face-to-face counseling provided for 27++ minutes today.   - As part of my medical decision making, I reviewed the following data [facility] the [facility] obtained from pt /family, CMA notes reviewed and incorporated if applicable, Labs reviewed, Radiograph/ tests reviewed if applicable and OV notes from prior OV's with me, as well as other specialists she/he has seen since seeing me last, were all reviewed and used in my medical decision making process today.   - Additionally, discussion had with patient regarding txmnt plan, their biases about that plan etc were used in my medical decision making today.   - The patient agreed with the plan and demonstrated an understanding of the instructions.   No barriers to understanding were identified.     Return for re-check Vit D, CMP, FLP and A1c in 3 months, follow-up OV 3 days later..    Orders Placed This Encounter  Procedures   VITAMIN D 25 Hydroxy (Vit-D Deficiency, Fractures)   Lipid panel   Comprehensive metabolic panel   Hemoglobin A1c    Meds ordered this encounter  Medications   FLUoxetine (PROZAC) 20 MG tablet    Sig: 1 tablet daily    Dispense:  90 tablet    Refill:  0   ALPRAZolam (XANAX) 0.25 MG tablet    Sig: Take 1 tablet (0.25 mg total) by mouth 3 (three) times daily as  needed for anxiety.    Dispense:  30 tablet    Refill:  0   Vitamin D, Ergocalciferol, (DRISDOL) 1.25 MG (50000 UT) CAPS capsule    Sig: Take one tablet wkly    Dispense:  12 capsule    Refill:  3   Dulaglutide (TRULICITY) 09.35MTS/1.7BLSOPN    Sig: Inject 0.5 mLs into the skin once a week.    Dispense:  4 pen    Refill:  1    Medications Discontinued During This Encounter  Medication Reason   Cholecalciferol (VITAMIN D) 2000 units tablet    ALPRAZolam (XANAX) 0.25 MG tablet Reorder   FLUoxetine (PROZAC) 10 MG tablet Reorder      Note:  This note was prepared with assistance of Dragon voice recognition software. Occasional wrong-word or sound-a-like substitutions may have occurred due to the inherent limitations of voice recognition software.  This document serves as a record of services personally performed by DMellody Dance DO. It was created on her behalf by KToni Amend a trained medical scribe. The creation of this record is based on the scribe's personal  observations and the provider's statements to them.   This case required medical decision making of at least moderate complexity. The above documentation has been reviewed to be accurate and was completed by Marjory Sneddon, D.O.      Patient Care Team    Relationship Specialty Notifications Start End  Mellody Dance, DO PCP - General Family Medicine  09/27/16   Ronald Lobo, MD Consulting Physician Gastroenterology  09/27/16   Delrae Rend, MD Consulting Physician Endocrinology  09/27/16    Comment: Roseanne Kaufman, MD Consulting Physician Dermatology  09/27/16   Arvella Nigh, MD Consulting Physician Obstetrics and Gynecology  09/27/16   Vevelyn Royals, MD Consulting Physician Ophthalmology  09/27/16     -Vitals obtained; medications/ allergies reconciled;  personal medical, social, Sx etc.histories were updated by CMA, reviewed by me and are reflected in chart   Patient Active  Problem List   Diagnosis Date Noted   Hypertension associated with diabetes ([place])-  with superimposed white coat HTN 09/27/2016   Hyperlipidemia associated with type 2 diabetes mellitus ([place]) 09/27/2016   Type 2 diabetes mellitus with other specified complication ([place]) 30/16/0109   Acute reaction to situational stress 10/30/2016   Noncompliance with treatment regimen 10/18/2016   Hypothyroidism 09/27/2016   Overweight (BMI 25.0-29.9) 09/27/2016   Vitamin D deficiency 09/27/2016   HSV-1 infection 09/27/2016   Anemia 09/27/2016   Environmental and seasonal allergies 09/27/2016   Elevated TSH 09/25/2019   Adjustment disorder with mixed anxiety and depressed mood 09/25/2019   Statin intolerance 09/25/2019   acute reaction to extreme stress 12/27/2018   Trigger finger, right ring finger 04/09/2018   Tenosynovitis of finger and hand 04/04/2018   Dysfunction of both eustachian tubes 04/04/2018   Vertigo 04/04/2018   Fatigue 04/04/2018   Vulvovaginitis 01/01/2018   Postmenopausal 32/35/5732   Umbilical hernia without obstruction and without gangrene 03/13/2017     Current Meds  Medication Sig   ALPRAZolam (XANAX) 0.25 MG tablet Take 1 tablet (0.25 mg total) by mouth 3 (three) times daily as needed for anxiety.   blood glucose meter kit and supplies Dispense based on patient and insurance preference. Use three to four times weekly as directed. (FOR ICD-10 E10.9, E11.9).   cyanocobalamin 2000 MCG tablet Take 2,000 mcg by mouth daily.   ezetimibe (ZETIA) 10 MG tablet TAKE 1 TABLET BY MOUTH DAILY.   ferrous sulfate 325 (65 FE) MG EC tablet Take 1 tablet (325 mg total) by mouth 2 (two) times daily.   Fexofenadine HCl (ALLEGRA ALLERGY PO) Take by mouth.   FLUoxetine (PROZAC) 20 MG tablet 1 tablet daily   FOLIC ACID PO Take 1 tablet by mouth daily.   FREESTYLE LITE test strip USE TO CHECK BLOOD SUGAR 3 TO 4 TIMES A WEKK AS DIRECTED   glipiZIDE (GLUCOTROL XL) 5  MG 24 hr tablet TAKE 1 TABLET (5 MG TOTAL) BY MOUTH DAILY WITH BREAKFAST.   hydrochlorothiazide (HYDRODIURIL) 25 MG tablet Take 1 tablet (25 mg total) by mouth daily.   JANUVIA 100 MG tablet TAKE 1 TABLET (100 MG TOTAL) BY MOUTH DAILY.   Lancets (FREESTYLE) lancets USE TO CHECK BLOOD SUGAR 3 TO 4 TIMES A WEEK AS DIRECTED   liothyronine (CYTOMEL) 5 MCG tablet Take 1 tablet (5 mcg total) by mouth 2 (two) times daily. **PATIENT NEEDS APT FOR FURTHER REFILLS**   lisinopril (ZESTRIL) 40 MG tablet Take 1 tablet (40 mg total) by mouth daily. **PATIENT NEEDS APT FOR FURTHER REFILLS**   metFORMIN (GLUCOPHAGE)  1000 MG tablet TAKE 1 TABLET BY MOUTH 2 TIMES DAILY WITH A MEAL.   metroNIDAZOLE (METROGEL) 1 % gel Apply 1 application topically daily as needed (rosacea).    Omega-3 Fatty Acids (FISH OIL) 1000 MG CAPS Take 1200 mg fish oil 2 tablets twice daily   SYNTHROID 100 MCG tablet Take 1 tablet (100 mcg total) by mouth daily before breakfast.   terconazole (TERAZOL 7) 0.4 % vaginal cream Place 1 applicator vaginally at bedtime.   valACYclovir (VALTREX) 1000 MG tablet Take 500 mg by mouth 2 (two) times daily as needed (fever blisters). 1/2 tablet twice daily for 5 days as needed for flares    [DISCONTINUED] ALPRAZolam (XANAX) 0.25 MG tablet Take 0.25 mg by mouth 3 (three) times daily as needed for anxiety.   [DISCONTINUED] Cholecalciferol (VITAMIN D) 2000 units tablet Take 2.5 tablets (5,000 Units total) by mouth daily.   [DISCONTINUED] FLUoxetine (PROZAC) 10 MG tablet 1 tablet daily     Allergies  Allergen Reactions   Asa [Aspirin] Anaphylaxis   Crestor [Rosuvastatin Calcium] Other (See Comments)    Abnormal LFTs   Claritin [Loratadine] Other (See Comments)    Fainted twice    Invokana [Canagliflozin] Other (See Comments)    Genital yeast infections   Jardiance [Empagliflozin] Other (See Comments)    Genital yeast infection   Lipitor [Atorvastatin] Other (See Comments)     Muscle aches    Simvastatin Other (See Comments)    Muscle aches     ROS:  See above HPI for pertinent positives and negatives   Objective:   Blood pressure 136/82, pulse 80, temperature 98.3 F (36.8 C), temperature source Oral, height 5' 5.5" (1.664 m), weight 175 lb (79.4 kg).  (if some vitals are omitted, this means that patient was UNABLE to obtain them even though they were asked to get them prior to OV today.  They were asked to call us at their earliest convenience with these once obtained.)  General: A & O * 3; visually in no acute distress; in usual state of health.  Skin: Visible skin appears normal and pt's usual skin color HEENT:  EOMI, head is normocephalic and atraumatic.  Sclera are anicteric. Neck has a good range of motion.  Lips are noncyanotic Chest: normal chest excursion and movement Respiratory: speaking in full sentences, no conversational dyspnea; no use of accessory muscles Psych: insight good, mood- appears full

## 2019-10-01 DIAGNOSIS — Z20828 Contact with and (suspected) exposure to other viral communicable diseases: Secondary | ICD-10-CM | POA: Diagnosis not present

## 2019-10-06 ENCOUNTER — Other Ambulatory Visit: Payer: Self-pay | Admitting: Family Medicine

## 2019-10-06 MED FILL — EZETIMIBE 10 MG TABS: 10 | 90 days supply | Qty: 90 | Fill #0

## 2019-10-22 ENCOUNTER — Other Ambulatory Visit: Payer: Self-pay | Admitting: Family Medicine

## 2019-10-23 MED FILL — metFORMIN HCL 1000 MG TABS: 1000 | 60 days supply | Qty: 120 | Fill #0

## 2019-10-23 MED FILL — LISINOPRIL 40 MG TABLET: 40 | 90 days supply | Qty: 90 | Fill #0

## 2019-10-23 MED FILL — glipiZIDE ER 5 MG TB24: 5 | 90 days supply | Qty: 90 | Fill #0

## 2019-10-23 MED FILL — LIOTHYRONINE SODIUM 5 MCG T: 5 | 60 days supply | Qty: 120 | Fill #0

## 2019-10-27 MED FILL — TRULICITY 0.75 MG/0.5 ML PE: 0.75 | 28 days supply | Qty: 2 | Fill #1

## 2019-11-25 ENCOUNTER — Other Ambulatory Visit: Payer: Self-pay | Admitting: Family Medicine

## 2019-11-25 DIAGNOSIS — E039 Hypothyroidism, unspecified: Secondary | ICD-10-CM

## 2019-11-25 DIAGNOSIS — E1159 Type 2 diabetes mellitus with other circulatory complications: Secondary | ICD-10-CM

## 2019-11-25 DIAGNOSIS — E1169 Type 2 diabetes mellitus with other specified complication: Secondary | ICD-10-CM

## 2019-11-25 DIAGNOSIS — I152 Hypertension secondary to endocrine disorders: Secondary | ICD-10-CM

## 2019-11-25 MED FILL — SYNTHROID 100 MCG TABLET: 100 | 90 days supply | Qty: 90 | Fill #0

## 2019-11-25 MED FILL — TRULICITY 0.75 MG/0.5 ML PE: 0.75 | 28 days supply | Qty: 2 | Fill #0

## 2019-11-25 MED FILL — HYDROCHLOROTHIAZIDE 25 MG T: 25 | 90 days supply | Qty: 90 | Fill #0

## 2019-12-22 ENCOUNTER — Other Ambulatory Visit: Payer: Self-pay | Admitting: Family Medicine

## 2019-12-22 MED FILL — FREESTYLE LITE TEST STRIP: 90 days supply | Qty: 100 | Fill #0

## 2019-12-22 MED FILL — LIOTHYRONINE SODIUM 5 MCG T: 5 | 60 days supply | Qty: 120 | Fill #0

## 2019-12-22 MED FILL — metFORMIN HCL 1000 MG TABS: 1000 | 60 days supply | Qty: 120 | Fill #0

## 2019-12-22 MED FILL — TRULICITY 0.75 MG/0.5 ML PE: 0.75 | 28 days supply | Qty: 2 | Fill #1

## 2019-12-24 ENCOUNTER — Other Ambulatory Visit: Payer: Self-pay | Admitting: Family Medicine

## 2019-12-24 ENCOUNTER — Encounter: Payer: Self-pay | Admitting: Family Medicine

## 2019-12-24 DIAGNOSIS — F430 Acute stress reaction: Secondary | ICD-10-CM

## 2019-12-24 DIAGNOSIS — F4323 Adjustment disorder with mixed anxiety and depressed mood: Secondary | ICD-10-CM

## 2019-12-24 MED ORDER — FLUOXETINE HCL 20 MG PO TABS: ORAL_TABLET | ORAL | 0 refills | Status: DC

## 2019-12-24 MED FILL — FLUOXETINE HCL 20 MG TABS: 20 | 90 days supply | Qty: 90 | Fill #0

## 2019-12-30 MED FILL — TRIAMCINOLONE ACETONIDE 0.1: 0.1 | 20 days supply | Qty: 60 | Fill #0

## 2020-01-20 ENCOUNTER — Other Ambulatory Visit: Payer: Self-pay | Admitting: Family Medicine

## 2020-01-20 DIAGNOSIS — E1169 Type 2 diabetes mellitus with other specified complication: Secondary | ICD-10-CM

## 2020-01-20 MED FILL — LISINOPRIL 40 MG TABLET: 40 | 30 days supply | Qty: 30 | Fill #0

## 2020-01-20 MED FILL — TRULICITY 0.75 MG/0.5 ML PE: 0.75 | 28 days supply | Qty: 2 | Fill #0

## 2020-01-20 MED FILL — EZETIMIBE 10 MG TABS: 10 | 90 days supply | Qty: 90 | Fill #1

## 2020-02-11 ENCOUNTER — Telehealth: Payer: Self-pay | Admitting: Family Medicine

## 2020-02-11 MED ORDER — GLIPIZIDE ER 5 MG PO TB24: ORAL_TABLET | ORAL | 0 refills | Status: DC

## 2020-02-11 MED ORDER — LIOTHYRONINE SODIUM 5 MCG PO TABS: 5.0000 ug | ORAL_TABLET | Freq: Two times a day (BID) | ORAL | 0 refills | Status: DC

## 2020-02-11 MED ORDER — LISINOPRIL 40 MG PO TABS: 40.0000 mg | ORAL_TABLET | Freq: Every day | ORAL | 0 refills | Status: DC

## 2020-02-11 MED FILL — LIOTHYRONINE SODIUM 5 MCG T: 5 | 60 days supply | Qty: 120 | Fill #0

## 2020-02-11 MED FILL — GLIPIZIDE ER 5 MG TB24: 5 | 90 days supply | Qty: 90 | Fill #0

## 2020-02-11 NOTE — Telephone Encounter (Signed)
Med sent to pharmacy for patient. AS, CMA

## 2020-02-11 NOTE — Telephone Encounter (Signed)
Patient needed to reschedule labs and f/u OV, but they next available appt is with Herb Grays in early June (which the patient is scheduled for), however she will be out of her liothyronine, glipizide, and lisinopril. She is requesting refill to get her to this appt, if approved please send to [facility]

## 2020-02-11 NOTE — Addendum Note (Signed)
Addended by: Mickel Crow on: 02/11/2020 03:47 PM   Modules accepted: Orders

## 2020-02-12 ENCOUNTER — Ambulatory Visit: Payer: 59 | Admitting: Family Medicine

## 2020-02-16 ENCOUNTER — Other Ambulatory Visit: Payer: Self-pay | Admitting: Family Medicine

## 2020-02-16 DIAGNOSIS — E119 Type 2 diabetes mellitus without complications: Secondary | ICD-10-CM

## 2020-02-16 DIAGNOSIS — E1169 Type 2 diabetes mellitus with other specified complication: Secondary | ICD-10-CM

## 2020-02-16 MED FILL — LISINOPRIL 40 MG TABLET: 40 | 90 days supply | Qty: 90 | Fill #0

## 2020-02-17 MED FILL — TRULICITY 0.75 MG/0.5 ML PE: 0.75 | 28 days supply | Qty: 2 | Fill #0

## 2020-02-17 MED FILL — JANUVIA 100 MG TABLET: 100 | 30 days supply | Qty: 30 | Fill #0

## 2020-03-01 ENCOUNTER — Other Ambulatory Visit: Payer: Self-pay | Admitting: Family Medicine

## 2020-03-01 ENCOUNTER — Other Ambulatory Visit: Payer: Self-pay | Admitting: Obstetrics and Gynecology

## 2020-03-01 DIAGNOSIS — Z1231 Encounter for screening mammogram for malignant neoplasm of breast: Secondary | ICD-10-CM

## 2020-03-04 ENCOUNTER — Ambulatory Visit: Payer: 59

## 2020-03-08 ENCOUNTER — Other Ambulatory Visit: Payer: Self-pay | Admitting: Physician Assistant

## 2020-03-08 ENCOUNTER — Telehealth: Payer: Self-pay | Admitting: Physician Assistant

## 2020-03-08 MED ORDER — METFORMIN HCL 1000 MG PO TABS: 1000.0000 mg | ORAL_TABLET | Freq: Two times a day (BID) | ORAL | 0 refills | Status: DC

## 2020-03-08 MED FILL — METFORMIN HCL 1000 MG TABS: 1000 | 90 days supply | Qty: 180 | Fill #0

## 2020-03-08 NOTE — Telephone Encounter (Signed)
Patient called states she has a upcoming Appt(June 3rd) w/ provider but is out of her Metformin and is requesting a refill .  --Forwarding request to med asst for :  metFORMIN (GLUCOPHAGE) 1000 MG tablet [id number] DISCONTINUED  Order Details Dose, Route, Frequency: As Directed  Dispense Quantity: 180 tablet Refills: 1       Sig: TAKE 1 TABLET BY MOUTH 2 TIMES DAILY WITH A MEAL.       ---Per patient Dr. Raliegh Scarlet usually gives her 3 month supply but lately it has been cut down to 2 month of (120 pills) unsure why the reduction.  --Pt uses :   [place], [name] - [address] [phone] (Phone) [phone] (Fax)   --Dion Body

## 2020-03-16 ENCOUNTER — Other Ambulatory Visit: Payer: 59

## 2020-03-16 ENCOUNTER — Other Ambulatory Visit: Payer: No Typology Code available for payment source

## 2020-03-16 ENCOUNTER — Other Ambulatory Visit: Payer: Self-pay

## 2020-03-16 DIAGNOSIS — E559 Vitamin D deficiency, unspecified: Secondary | ICD-10-CM

## 2020-03-16 DIAGNOSIS — E039 Hypothyroidism, unspecified: Secondary | ICD-10-CM

## 2020-03-16 DIAGNOSIS — I152 Hypertension secondary to endocrine disorders: Secondary | ICD-10-CM

## 2020-03-16 DIAGNOSIS — E1169 Type 2 diabetes mellitus with other specified complication: Secondary | ICD-10-CM

## 2020-03-17 LAB — COMPREHENSIVE METABOLIC PANEL
ALT: 30 IU/L (ref 0–32)
AST: 32 IU/L (ref 0–40)
Albumin/Globulin Ratio: 1.8 (ref 1.2–2.2)
Albumin: 4.4 g/dL (ref 3.8–4.8)
Alkaline Phosphatase: 62 IU/L (ref 48–121)
BUN/Creatinine Ratio: 17 (ref 12–28)
BUN: 12 mg/dL (ref 8–27)
Bilirubin Total: 0.2 mg/dL (ref 0.0–1.2)
CO2: 22 mmol/L (ref 20–29)
Calcium: 9.5 mg/dL (ref 8.7–10.3)
Chloride: 100 mmol/L (ref 96–106)
Creatinine, Ser: 0.72 mg/dL (ref 0.57–1.00)
GFR calc Af Amer: 102 mL/min/{1.73_m2} (ref >59)
GFR calc non Af Amer: 88 mL/min/{1.73_m2} (ref >59)
Globulin, Total: 2.4 g/dL (ref 1.5–4.5)
Glucose: 165 mg/dL — ABNORMAL HIGH (ref 65–99)
Potassium: 4.4 mmol/L (ref 3.5–5.2)
Sodium: 136 mmol/L (ref 134–144)
Total Protein: 6.8 g/dL (ref 6.0–8.5)

## 2020-03-17 LAB — VITAMIN D 25 HYDROXY (VIT D DEFICIENCY, FRACTURES): Vit D, 25-Hydroxy: 44.8 ng/mL (ref 30.0–100.0)

## 2020-03-17 LAB — LIPID PANEL
Chol/HDL Ratio: 3.3 ratio (ref 0.0–4.4)
Cholesterol, Total: 200 mg/dL — ABNORMAL HIGH (ref 100–199)
HDL: 61 mg/dL (ref >39)
LDL Chol Calc (NIH): 117 mg/dL — ABNORMAL HIGH (ref 0–99)
Triglycerides: 126 mg/dL (ref 0–149)
VLDL Cholesterol Cal: 22 mg/dL (ref 5–40)

## 2020-03-17 LAB — HEMOGLOBIN A1C
Est. average glucose Bld gHb Est-mCnc: 180 mg/dL
Hgb A1c MFr Bld: 7.9 % — ABNORMAL HIGH (ref 4.8–5.6)

## 2020-03-18 ENCOUNTER — Encounter: Payer: Self-pay | Admitting: Physician Assistant

## 2020-03-18 ENCOUNTER — Other Ambulatory Visit: Payer: Self-pay

## 2020-03-18 ENCOUNTER — Ambulatory Visit (INDEPENDENT_AMBULATORY_CARE_PROVIDER_SITE_OTHER): Payer: No Typology Code available for payment source | Admitting: Physician Assistant

## 2020-03-18 VITALS — BP 172/77 | HR 68 | Temp 98.1°F | Ht 65.5 in | Wt 182.2 lb

## 2020-03-18 DIAGNOSIS — E1169 Type 2 diabetes mellitus with other specified complication: Secondary | ICD-10-CM

## 2020-03-18 DIAGNOSIS — E1159 Type 2 diabetes mellitus with other circulatory complications: Secondary | ICD-10-CM

## 2020-03-18 DIAGNOSIS — I10 Essential (primary) hypertension: Secondary | ICD-10-CM

## 2020-03-18 DIAGNOSIS — E559 Vitamin D deficiency, unspecified: Secondary | ICD-10-CM

## 2020-03-18 DIAGNOSIS — Z789 Other specified health status: Secondary | ICD-10-CM | POA: Diagnosis not present

## 2020-03-18 DIAGNOSIS — I152 Hypertension secondary to endocrine disorders: Secondary | ICD-10-CM

## 2020-03-18 DIAGNOSIS — E039 Hypothyroidism, unspecified: Secondary | ICD-10-CM

## 2020-03-18 DIAGNOSIS — E785 Hyperlipidemia, unspecified: Secondary | ICD-10-CM

## 2020-03-18 LAB — POCT UA - MICROALBUMIN
Albumin/Creatinine Ratio, Urine, POC: <30
Creatinine, POC: 100 mg/dL
Microalbumin Ur, POC: 10 mg/L

## 2020-03-18 MED ORDER — SYNTHROID 100 MCG PO TABS: ORAL_TABLET | ORAL | 1 refills | Status: DC

## 2020-03-18 MED ORDER — TRULICITY 0.75 MG/0.5ML ~~LOC~~ SOAJ: SUBCUTANEOUS | 2 refills | Status: DC

## 2020-03-18 MED FILL — TRULICITY 0.75 MG/0.5 ML PE: 0.75 | 28 days supply | Qty: 2 | Fill #0

## 2020-03-18 MED FILL — SYNTHROID 100 MCG TABLET: 100 | 90 days supply | Qty: 90 | Fill #0

## 2020-03-18 NOTE — Progress Notes (Signed)
Established Patient Office Visit  Subjective:  Patient ID: Robin Greene, female    DOB: 01-Mar-1956  Age: 64 y.o. MRN: [id number]  CC:  Chief Complaint  Patient presents with   Hyperlipidemia   Hypothyroidism   Diabetes    HPI Robin Greene presents for chronic follow-up on diabetes mellitus, hypertension, hyperlipidemia, and hypothyroidism.  Diabetes: Pt denies increased urination or thirst. Pt reports medication compliance. No hypoglycemic events. Checking glucose at home. FBS range 120-160. She plans to restart [employer] meal plan to improve dietary habits and for weight loss. She tries to stay as active as possible.   HTN: Pt denies chest pain, palpitations, dizziness or lower extremity swelling. Taking medication as directed without side effects. Checks BP at home  and readings range in 128-140/70s. Pt follows a low salt diet.  HLD: Pt taking medication as directed without issues. She plans to enroll in [place] to improve diet. She has tried it in the past and was satisfied with the results she obtained with weight loss.    Hypothyroid: Taking Cytomel and Synthroid without issues. Reports there were a few days she missed one of her thyroid dose because she misplaced her bottle and states she noticed a difference. She felt symptomatic with fatigue and once she realized she was missing one of her doses she resumed and symptoms resolved. Currently asymptomatic.     Past Medical History:  Diagnosis Date   Anemia    Arthritis    Complication of anesthesia    Diabetes mellitus    dx 2010   GERD (gastroesophageal reflux disease)    occasional   will take OTC   HSV-1 infection    Hypertension    PONV (postoperative nausea and vomiting)    Vitamin D deficiency     Past Surgical History:  Procedure Laterality Date   ABDOMINAL HYSTERECTOMY     EYE SURGERY     INSERTION OF MESH N/A 04/30/2017   Procedure: INSERTION OF MESH;  Surgeon: Jackolyn Confer, MD;   Location: [facility];  Service: General;  Laterality: N/A;   OVARY SURGERY     REFRACTIVE SURGERY     UMBILICAL HERNIA REPAIR N/A 04/30/2017   Procedure: UMBILICAL HERNIA REPAIR WITH MESH;  Surgeon: Jackolyn Confer, MD;  Location: [facility];  Service: General;  Laterality: N/A;    Family History  Problem Relation Age of Onset   Sjogren's syndrome Mother    Hypertension Father    Hyperlipidemia Father    Healthy Sister     Social History   Socioeconomic History   Marital status: Married    Spouse name: Not on file   Number of children: Not on file   Years of education: Not on file   Highest education level: Not on file  Occupational History   Not on file  Tobacco Use   Smoking status: Never Smoker   Smokeless tobacco: Never Used  Substance and Sexual Activity   Alcohol use: Yes    Comment: occasionally   Drug use: No   Sexual activity: Yes    Birth control/protection: Surgical  Other Topics Concern   Not on file  Social History Narrative   Not on file   Social Determinants of Health   Financial Resource Strain:    Difficulty of Paying Living Expenses:   Food Insecurity:    Worried About [place] in the Last Year:    Ran Out of Food in the Last Year:   Transportation Needs:  Lack of Transportation (Medical):    Lack of Transportation (Non-Medical):   Physical Activity:    Days of Exercise per Week:    Minutes of Exercise per Session:   Stress:    Feeling of Stress :   Social Connections:    Frequency of Communication with Friends and Family:    Frequency of Social Gatherings with Friends and Family:    Attends Religious Services:    Active Member of Clubs or Organizations:    Attends Music therapist:    Marital Status:   Intimate Partner Violence:    Fear of Current or Ex-Partner:    Emotionally Abused:    Physically Abused:    Sexually Abused:     Outpatient Medications Prior to Visit   Medication Sig Dispense Refill   ALPRAZolam (XANAX) 0.25 MG tablet Take 1 tablet (0.25 mg total) by mouth 3 (three) times daily as needed for anxiety. 30 tablet 0   blood glucose meter kit and supplies Dispense based on patient and insurance preference. Use three to four times weekly as directed. (FOR ICD-10 E10.9, E11.9). 1 each 0   cyanocobalamin 2000 MCG tablet Take 2,000 mcg by mouth daily.     ezetimibe (ZETIA) 10 MG tablet TAKE 1 TABLET BY MOUTH DAILY. 90 tablet 1   Fexofenadine HCl (ALLEGRA ALLERGY PO) Take by mouth.     FLUoxetine (PROZAC) 20 MG tablet 1 tablet daily 90 tablet 0   FOLIC ACID PO Take 1 tablet by mouth daily.     FREESTYLE LITE test strip USE TO CHECK BLOOD SUGAR 3 TO 4 TIMES A WEEK AS DIRECTED 100 strip 0   glipiZIDE (GLUCOTROL XL) 5 MG 24 hr tablet TAKE 1 TABLET (5 MG TOTAL) BY MOUTH DAILY WITH BREAKFAST. 90 tablet 0   hydrochlorothiazide (HYDRODIURIL) 25 MG tablet TAKE 1 TABLET BY MOUTH ONCE DAILY 90 tablet 1   JANUVIA 100 MG tablet TAKE 1 TABLET (100 MG TOTAL) BY MOUTH DAILY. 30 tablet 0   Lancets (FREESTYLE) lancets USE TO CHECK BLOOD SUGAR 3 TO 4 TIMES A WEEK AS DIRECTED 100 each 0   liothyronine (CYTOMEL) 5 MCG tablet Take 1 tablet (5 mcg total) by mouth 2 (two) times daily. 120 tablet 0   lisinopril (ZESTRIL) 40 MG tablet Take 1 tablet (40 mg total) by mouth daily. 90 tablet 0   metFORMIN (GLUCOPHAGE) 1000 MG tablet Take 1 tablet (1,000 mg total) by mouth 2 (two) times daily with a meal. 180 tablet 0   metroNIDAZOLE (METROGEL) 1 % gel Apply 1 application topically daily as needed (rosacea).      terconazole (TERAZOL 7) 0.4 % vaginal cream Place 1 applicator vaginally at bedtime. 45 g 1   valACYclovir (VALTREX) 1000 MG tablet Take 500 mg by mouth 2 (two) times daily as needed (fever blisters). 1/2 tablet twice daily for 5 days as needed for flares      SYNTHROID 100 MCG tablet TAKE 1 TABLET BY MOUTH ONCE DAILY BEFORE BREAKFAST 90 tablet 1    TRULICITY 7.11 AF/7.9UX SOPN INJECT 1 PEN INTO THE SKIN ONCE WEEKLY 2 mL 0   ferrous sulfate 325 (65 FE) MG EC tablet Take 1 tablet (325 mg total) by mouth 2 (two) times daily. 180 tablet 1   Omega-3 Fatty Acids (FISH OIL) 1000 MG CAPS Take 1200 mg fish oil 2 tablets twice daily     Vitamin D, Ergocalciferol, (DRISDOL) 1.25 MG (50000 UT) CAPS capsule Take one tablet wkly 12 capsule 3  No facility-administered medications prior to visit.    Allergies  Allergen Reactions   Asa [Aspirin] Anaphylaxis   Crestor [Rosuvastatin Calcium] Other (See Comments)    Abnormal LFTs   Claritin [Loratadine] Other (See Comments)    Fainted twice    Invokana [Canagliflozin] Other (See Comments)    Genital yeast infections   Jardiance [Empagliflozin] Other (See Comments)    Genital yeast infection   Lipitor [Atorvastatin] Other (See Comments)    Muscle aches    Simvastatin Other (See Comments)    Muscle aches    ROS Review of Systems  A fourteen system review of systems was performed and found to be positive as per HPI.    Objective:    Physical Exam General:  Well Developed, well nourished, appropriate for stated age.  Neuro:  Alert and oriented,  extra-ocular muscles intact  HEENT:  Normocephalic, atraumatic, neck supple Skin:  no gross rash, warm, pink. Cardiac:  RRR, S1 S2 Respiratory:  ECTA B/L, Not using accessory muscles, speaking in full sentences- unlabored. Vascular:  Ext warm, no cyanosis apprec.; no edema present. Psych:  No HI/SI, judgement and insight good, Euthymic mood. Full Affect.  BP (!) 172/77    Pulse 68    Temp 98.1 F (36.7 C) (Oral)    Ht 5' 5.5" (1.664 m)    Wt 182 lb 3.2 oz (82.6 kg)    SpO2 97% Comment: on RA   BMI 29.86 kg/m  Wt Readings from Last 3 Encounters:  03/18/20 182 lb 3.2 oz (82.6 kg)  09/25/19 175 lb (79.4 kg)  05/27/19 175 lb 8 oz (79.6 kg)     Health Maintenance Due  Topic Date Due   OPHTHALMOLOGY EXAM  Never done   COVID-19  Vaccine (1) Never done   PAP SMEAR-Modifier  Never done   COLONOSCOPY  12/29/2019    There are no preventive care reminders to display for this patient.  Lab Results  Component Value Date   TSH 5.470 (H) 08/25/2019   Lab Results  Component Value Date   WBC 6.2 08/25/2019   HGB 12.3 08/25/2019   HCT 37.3 08/25/2019   MCV 85 08/25/2019   PLT 317 08/25/2019   Lab Results  Component Value Date   NA 136 03/16/2020   K 4.4 03/16/2020   CO2 22 03/16/2020   GLUCOSE 165 (H) 03/16/2020   BUN 12 03/16/2020   CREATININE 0.72 03/16/2020   BILITOT 0.2 03/16/2020   ALKPHOS 62 03/16/2020   AST 32 03/16/2020   ALT 30 03/16/2020   PROT 6.8 03/16/2020   ALBUMIN 4.4 03/16/2020   CALCIUM 9.5 03/16/2020   ANIONGAP 9 04/19/2017   Lab Results  Component Value Date   CHOL 200 (H) 03/16/2020   Lab Results  Component Value Date   HDL 61 03/16/2020   Lab Results  Component Value Date   LDLCALC 117 (H) 03/16/2020   Lab Results  Component Value Date   TRIG 126 03/16/2020   Lab Results  Component Value Date   CHOLHDL 3.3 03/16/2020   Lab Results  Component Value Date   HGBA1C 7.9 (H) 03/16/2020      Assessment & Plan:   Problem List Items Addressed This Visit      Cardiovascular and Mediastinum   Hypertension associated with diabetes ([place])-  with superimposed white coat HTN (Chronic)    - BP today is 172/77, above goal and pt has hx of white coat syndrome. - Continue Lisinopril and HCTZ. - Continue  ambulatory BP and pulse monitoring and keep a log. - Continue DASH diet. - Encourage to stay as active as possible.       Relevant Medications   Dulaglutide (TRULICITY) 0.37 QD/6.4RC SOPN     Endocrine   Hypothyroidism (Chronic)    - Last TSH mildly elevated and free T4 normal. - Asymptomatic. - Continue Levothyroxine and Liothyronine - Pt recently missed (unintentional) a couple of her medication doses so will recheck TSH at next OV in 3 months.      Relevant  Medications   SYNTHROID 100 MCG tablet   Hyperlipidemia associated with type 2 diabetes mellitus (HCC) (Chronic)    - Most lipid panel is stable: LDL elevated at 117 - Continue Zetia. - Follow heart healthy diet and stay as active as possible.        Relevant Medications   Dulaglutide (TRULICITY) 3.81 MM/0.3FV SOPN   Type 2 diabetes mellitus with other specified complication ([place]) - Primary    - Most recent A1c is 7.9, slightly decreased from prior but still above goal. - Patient plans to re-start [employer] meal plan to help with weight loss and improve diet to help better control diabetes. Discussed with patient if A1c remains above goal at next OV despite making dietary changes, then I will consider making medication adjustments. Pt verbalized understanding and agreeable.  - Continue Trulicity, Glipizide, Metformin and Januvia.  - Continue ambulatory glucose monitoring and notify clinic if FBS consistently <80 or >160. - Follow low glucose/carbohydrate diet.       Relevant Medications   Dulaglutide (TRULICITY) 4.36 GO/7.7CH SOPN   Other Relevant Orders   POCT UA - Microalbumin (Completed)     Other   Vitamin D deficiency (Chronic)    - Most recent Vit D level wnl- 44.8 - Discontinue Vit D 50,000 units and take OTC Vit D3 1000 units.  - Will continue to monitor.      Statin intolerance      Meds ordered this encounter  Medications   SYNTHROID 100 MCG tablet    Sig: TAKE 1 TABLET BY MOUTH ONCE DAILY BEFORE BREAKFAST    Dispense:  90 tablet    Refill:  1    Order Specific Question:   Supervising Provider    Answer:   Beatrice Lecher D [[id number]]   Dulaglutide (TRULICITY) 4.03 TC/4.8LY SOPN    Sig: INJECT 1 PEN INTO THE SKIN ONCE WEEKLY    Dispense:  2 mL    Refill:  2    Order Specific Question:   Supervising Provider    Answer:   Beatrice Lecher D [[id number]]    Follow-up: Return in about 3 months (around 06/18/2020) for DM, HTN.    Lorrene Reid, PA-C

## 2020-03-18 NOTE — Patient Instructions (Signed)

## 2020-03-22 ENCOUNTER — Ambulatory Visit
Admission: RE | Admit: 2020-03-22 | Discharge: 2020-03-22 | Disposition: A | Discharge status: Home / Self Care | Payer: No Typology Code available for payment source | Source: Ambulatory Visit | Attending: Obstetrics and Gynecology | Admitting: Obstetrics and Gynecology

## 2020-03-22 ENCOUNTER — Other Ambulatory Visit: Payer: Self-pay

## 2020-03-22 DIAGNOSIS — Z1231 Encounter for screening mammogram for malignant neoplasm of breast: Secondary | ICD-10-CM

## 2020-03-24 ENCOUNTER — Other Ambulatory Visit (HOSPITAL_COMMUNITY): Payer: Self-pay | Admitting: Obstetrics and Gynecology

## 2020-03-30 NOTE — Assessment & Plan Note (Signed)
-   Last TSH mildly elevated and free T4 normal. - Asymptomatic. - Continue Levothyroxine and Liothyronine - Pt recently missed (unintentional) a couple of her medication doses so will recheck TSH at next OV in 3 months.

## 2020-03-30 NOTE — Assessment & Plan Note (Signed)
-   Most lipid panel is stable: LDL elevated at 117 - Continue Zetia. - Follow heart healthy diet and stay as active as possible.

## 2020-03-30 NOTE — Assessment & Plan Note (Signed)
-   BP today is 172/77, above goal and pt has hx of white coat syndrome. - Continue Lisinopril and HCTZ. - Continue ambulatory BP and pulse monitoring and keep a log. - Continue DASH diet. - Encourage to stay as active as possible.

## 2020-03-30 NOTE — Assessment & Plan Note (Addendum)
-   Most recent Vit D level wnl- 44.8 - Discontinue Vit D 50,000 units and take OTC Vit D3 1000 units.  - Will continue to monitor.

## 2020-03-30 NOTE — Assessment & Plan Note (Addendum)
-   Most recent A1c is 7.9, slightly decreased from prior but still above goal. - Patient plans to re-start [employer] meal plan to help with weight loss and improve diet to help better control diabetes. Discussed with patient if A1c remains above goal at next OV despite making dietary changes, then I will consider making medication adjustments. Pt verbalized understanding and agreeable.  - Continue Trulicity, Glipizide, Metformin and Januvia.  - Foot exam today WNL - UA microalbumin normal (<30). - Continue ambulatory glucose monitoring and notify clinic if FBS consistently <80 or >160. - Follow low glucose/carbohydrate diet.

## 2020-04-07 ENCOUNTER — Telehealth: Payer: Self-pay | Admitting: Physician Assistant

## 2020-04-07 DIAGNOSIS — E119 Type 2 diabetes mellitus without complications: Secondary | ICD-10-CM

## 2020-04-07 DIAGNOSIS — F4323 Adjustment disorder with mixed anxiety and depressed mood: Secondary | ICD-10-CM

## 2020-04-07 DIAGNOSIS — F430 Acute stress reaction: Secondary | ICD-10-CM

## 2020-04-07 MED ORDER — FLUOXETINE HCL 20 MG PO TABS: ORAL_TABLET | ORAL | 0 refills | Status: DC

## 2020-04-07 MED ORDER — SITAGLIPTIN PHOSPHATE 100 MG PO TABS: 100.0000 mg | ORAL_TABLET | Freq: Every day | ORAL | 0 refills | Status: DC

## 2020-04-07 MED ORDER — EZETIMIBE 10 MG PO TABS: 10.0000 mg | ORAL_TABLET | Freq: Every day | ORAL | 0 refills | Status: DC

## 2020-04-07 MED FILL — JANUVIA 100 MG TABLET: 100 | 30 days supply | Qty: 30 | Fill #0

## 2020-04-07 MED FILL — FLUOXETINE HCL 20 MG TABS: 20 | 90 days supply | Qty: 90 | Fill #0

## 2020-04-07 NOTE — Addendum Note (Signed)
Addended by: Mickel Crow on: 04/07/2020 01:23 PM   Modules accepted: Orders

## 2020-04-07 NOTE — Telephone Encounter (Signed)
Patient is requesting a refill of her fluoxetine, ezetimibe, and Januvia. If approved please send to [facility].

## 2020-04-07 NOTE — Telephone Encounter (Signed)
Refill sent to pharmacy. AS, CMA 

## 2020-04-12 MED FILL — EZETIMIBE 10 MG TABS: 10 | 90 days supply | Qty: 90 | Fill #0

## 2020-04-15 MED FILL — TRULICITY 0.75 MG/0.5 ML PE: 0.75 | 28 days supply | Qty: 2 | Fill #1

## 2020-04-27 ENCOUNTER — Other Ambulatory Visit: Payer: Self-pay | Admitting: Physician Assistant

## 2020-04-27 MED FILL — LIOTHYRONINE SODIUM 5 MCG T: 5 | 60 days supply | Qty: 120 | Fill #0

## 2020-05-03 ENCOUNTER — Other Ambulatory Visit: Payer: Self-pay | Admitting: Physician Assistant

## 2020-05-03 MED FILL — JANUVIA 100 MG TABLET: 100 | 30 days supply | Qty: 30 | Fill #1

## 2020-05-17 ENCOUNTER — Telehealth: Payer: Self-pay | Admitting: Physician Assistant

## 2020-05-17 ENCOUNTER — Other Ambulatory Visit: Payer: Self-pay | Admitting: Physician Assistant

## 2020-05-17 DIAGNOSIS — M6530 Trigger finger, unspecified finger: Secondary | ICD-10-CM

## 2020-05-17 MED FILL — LISINOPRIL 40 MG TABS: 40 | 90 days supply | Qty: 90 | Fill #0

## 2020-05-17 MED FILL — ESTRADIOL 0.1 MG/GM CREA: 0.1 | 42 days supply | Qty: 43 | Fill #1

## 2020-05-17 NOTE — Telephone Encounter (Signed)
Per patient seeking Referral --- now the trigger finger issue has jumped to the Left middle finger (last year it was the rt hand & Dr. Amedeo Plenty released it she would like to return to him )  --Forwarding referral request to provider & Dorothea Ogle /coord.  --glh

## 2020-05-17 NOTE — Telephone Encounter (Signed)
[place] for referral per [facility].  Referral has been placed. Patient is aware. AS< CMA

## 2020-05-17 NOTE — Telephone Encounter (Signed)
Pt called states was unsure if mychart request for refill on :   glipiZIDE (GLUCOTROL XL) 5 MG 24 hr tablet [[id number]]   Order Details Dose, Route, Frequency: As Directed  Dispense Quantity: 90 tablet Refills: 0       Sig: TAKE 1 TABLET (5 MG TOTAL) BY MOUTH DAILY WITH BREAKFAST.      Came thru---- Forwarding request to med asst that if approved send refill order to :    [place], [name] - [address]  [place], [name] [address]  Phone:  [phone] Fax:  [phone]   glh

## 2020-05-18 MED FILL — GLIPIZIDE ER 5 MG TB24: 5 | 90 days supply | Qty: 90 | Fill #0

## 2020-05-24 MED FILL — TRULICITY 0.75 MG/0.5 ML PE: 0.75 | 28 days supply | Qty: 2 | Fill #2

## 2020-06-08 ENCOUNTER — Other Ambulatory Visit: Payer: Self-pay | Admitting: Physician Assistant

## 2020-06-08 MED FILL — JANUVIA 100 MG TABLET: 100 | 30 days supply | Qty: 30 | Fill #2

## 2020-06-08 MED FILL — METFORMIN HCL 1000 MG TABS: 1000 | 90 days supply | Qty: 180 | Fill #0

## 2020-06-10 MED FILL — valACYclovir HCL 1 GM TABS: 1 | 30 days supply | Qty: 30 | Fill #1

## 2020-06-14 MED FILL — DOXYCYCLINE HYCLATE 100 MG: 100 | 14 days supply | Qty: 28 | Fill #0

## 2020-06-15 MED FILL — CEPHALEXIN 500 MG CAPSULE: 500 | 8 days supply | Qty: 32 | Fill #0

## 2020-06-15 MED FILL — TRIAMCINOLONE 0.1% CREAM: 0.1 | 20 days supply | Qty: 80 | Fill #0

## 2020-06-15 MED FILL — HYDROCODON-APAP 5-325: 5-325 | 6 days supply | Qty: 40 | Fill #0

## 2020-06-22 ENCOUNTER — Ambulatory Visit: Payer: No Typology Code available for payment source | Admitting: Physician Assistant

## 2020-06-24 ENCOUNTER — Other Ambulatory Visit: Payer: Self-pay | Admitting: Physician Assistant

## 2020-06-24 ENCOUNTER — Encounter: Payer: Self-pay | Admitting: Physician Assistant

## 2020-06-24 ENCOUNTER — Ambulatory Visit (INDEPENDENT_AMBULATORY_CARE_PROVIDER_SITE_OTHER): Payer: No Typology Code available for payment source | Admitting: Physician Assistant

## 2020-06-24 ENCOUNTER — Other Ambulatory Visit: Payer: Self-pay

## 2020-06-24 VITALS — BP 164/83 | HR 92 | Ht 65.5 in | Wt 179.8 lb

## 2020-06-24 DIAGNOSIS — Z789 Other specified health status: Secondary | ICD-10-CM

## 2020-06-24 DIAGNOSIS — B373 Candidiasis of vulva and vagina: Secondary | ICD-10-CM | POA: Diagnosis not present

## 2020-06-24 DIAGNOSIS — E1159 Type 2 diabetes mellitus with other circulatory complications: Secondary | ICD-10-CM | POA: Diagnosis not present

## 2020-06-24 DIAGNOSIS — E785 Hyperlipidemia, unspecified: Secondary | ICD-10-CM

## 2020-06-24 DIAGNOSIS — E1169 Type 2 diabetes mellitus with other specified complication: Secondary | ICD-10-CM | POA: Diagnosis not present

## 2020-06-24 DIAGNOSIS — I10 Essential (primary) hypertension: Secondary | ICD-10-CM

## 2020-06-24 DIAGNOSIS — I152 Hypertension secondary to endocrine disorders: Secondary | ICD-10-CM

## 2020-06-24 DIAGNOSIS — E119 Type 2 diabetes mellitus without complications: Secondary | ICD-10-CM | POA: Diagnosis not present

## 2020-06-24 DIAGNOSIS — B3731 Acute candidiasis of vulva and vagina: Secondary | ICD-10-CM

## 2020-06-24 DIAGNOSIS — E039 Hypothyroidism, unspecified: Secondary | ICD-10-CM

## 2020-06-24 LAB — POCT GLYCOSYLATED HEMOGLOBIN (HGB A1C): Hemoglobin A1C: 7.5 % — AB (ref 4.0–5.6)

## 2020-06-24 MED ORDER — HYDROCHLOROTHIAZIDE 25 MG PO TABS: 25.0000 mg | ORAL_TABLET | Freq: Every day | ORAL | 1 refills | Status: DC

## 2020-06-24 MED ORDER — TERCONAZOLE 0.4 % VA CREA: 1.0000 | TOPICAL_CREAM | Freq: Every day | VAGINAL | 1 refills | Status: DC

## 2020-06-24 MED FILL — TERCONAZOLE 0.4% CREAM: 0.4 | 7 days supply | Qty: 45 | Fill #0

## 2020-06-24 MED FILL — TRULICITY 0.75 MG/0.5 ML PE: 0.75 | 84 days supply | Qty: 6 | Fill #0

## 2020-06-24 MED FILL — HYDROCHLOROTHIAZIDE 25 MG T: 25 | 90 days supply | Qty: 90 | Fill #0

## 2020-06-24 MED FILL — LIOTHYRONINE SODIUM 5 MCG T: 5 | 60 days supply | Qty: 120 | Fill #0

## 2020-06-24 NOTE — Patient Instructions (Signed)
Diabetes Mellitus and Nutrition, Adult When you have diabetes (diabetes mellitus), it is very important to have healthy eating habits because your blood sugar (glucose) levels are greatly affected by what you eat and drink. Eating healthy foods in the appropriate amounts, at about the same times every day, can help you:  Control your blood glucose.  Lower your risk of heart disease.  Improve your blood pressure.  Reach or maintain a healthy weight. Every person with diabetes is different, and each person has different needs for a meal plan. Your health care provider may recommend that you work with a diet and nutrition specialist (dietitian) to make a meal plan that is best for you. Your meal plan may vary depending on factors such as:  The calories you need.  The medicines you take.  Your weight.  Your blood glucose, blood pressure, and cholesterol levels.  Your activity level.  Other health conditions you have, such as heart or kidney disease. How do carbohydrates affect me? Carbohydrates, also called carbs, affect your blood glucose level more than any other type of food. Eating carbs naturally raises the amount of glucose in your blood. Carb counting is a method for keeping track of how many carbs you eat. Counting carbs is important to keep your blood glucose at a healthy level, especially if you use insulin or take certain oral diabetes medicines. It is important to know how many carbs you can safely have in each meal. This is different for every person. Your dietitian can help you calculate how many carbs you should have at each meal and for each snack. Foods that contain carbs include:  Bread, cereal, rice, pasta, and crackers.  Potatoes and corn.  Peas, beans, and lentils.  Milk and yogurt. Fruit and juice.  Desserts, such as cakes, cookies, ice cream, and candy. How does alcohol affect me? Alcohol can cause a sudden decrease in blood glucose (hypoglycemia), especially  if you use insulin or take certain oral diabetes medicines. Hypoglycemia can be a life-threatening condition. Symptoms of hypoglycemia (sleepiness, dizziness, and confusion) are similar to symptoms of having too much alcohol. If your health care provider says that alcohol is safe for you, follow these guidelines:  Limit alcohol intake to no more than 1 drink per day for nonpregnant women and 2 drinks per day for men. One drink equals 12 oz of beer, 5 oz of wine, or 1 oz of hard liquor.  Do not drink on an empty stomach.  Keep yourself hydrated with water, diet soda, or unsweetened iced tea.  Keep in mind that regular soda, juice, and other mixers may contain a lot of sugar and must be counted as carbs. What are tips for following this plan?  Reading food labels  Start by checking the serving size on the "Nutrition Facts" label of packaged foods and drinks. The amount of calories, carbs, fats, and other nutrients listed on the label is based on one serving of the item. Many items contain more than one serving per package.  Check the total grams (g) of carbs in one serving. You can calculate the number of servings of carbs in one serving by dividing the total carbs by 15. For example, if a food has 30 g of total carbs, it would be equal to 2 servings of carbs.  Check the number of grams (g) of saturated and trans fats in one serving. Choose foods that have low or no amount of these fats.  Check the number of milligrams (  mg) of salt (sodium) in one serving. Most people should limit total sodium intake to less than 2,300 mg per day.  Always check the nutrition information of foods labeled as "low-fat" or "nonfat". These foods may be higher in added sugar or refined carbs and should be avoided.  Talk to your dietitian to identify your daily goals for nutrients listed on the label. Shopping  Avoid buying canned, premade, or processed foods. These foods tend to be high in fat, sodium, and added  sugar.  Shop around the outside edge of the grocery store. This includes fresh fruits and vegetables, bulk grains, fresh meats, and fresh dairy. Cooking  Use low-heat cooking methods, such as baking, instead of high-heat cooking methods like deep frying.  Cook using healthy oils, such as olive, canola, or sunflower oil.  Avoid cooking with butter, cream, or high-fat meats. Meal planning  Eat meals and snacks regularly, preferably at the same times every day. Avoid going long periods of time without eating.  Eat foods high in fiber, such as fresh fruits, vegetables, beans, and whole grains. Talk to your dietitian about how many servings of carbs you can eat at each meal.  Eat 4-6 ounces (oz) of lean protein each day, such as lean meat, chicken, fish, eggs, or tofu. One oz of lean protein is equal to: ? 1 oz of meat, chicken, or fish. ? 1 egg. ?  cup of tofu.  Eat some foods each day that contain healthy fats, such as avocado, nuts, seeds, and fish. Lifestyle  Check your blood glucose regularly.  Exercise regularly as told by your health care provider. This may include: ? 150 minutes of moderate-intensity or vigorous-intensity exercise each week. This could be brisk walking, biking, or water aerobics. ? Stretching and doing strength exercises, such as yoga or weightlifting, at least 2 times a week.  Take medicines as told by your health care provider.  Do not use any products that contain nicotine or tobacco, such as cigarettes and e-cigarettes. If you need help quitting, ask your health care provider.  Work with a counselor or diabetes educator to identify strategies to manage stress and any emotional and social challenges. Questions to ask a health care provider  Do I need to meet with a diabetes educator?  Do I need to meet with a dietitian?  What number can I call if I have questions?  When are the best times to check my blood glucose? Where to find more  information:  American Diabetes Association: diabetes.org  Academy of Nutrition and Dietetics: www.eatright.org  National Institute of Diabetes and Digestive and Kidney Diseases (NIH): www.niddk.nih.gov Summary  A healthy meal plan will help you control your blood glucose and maintain a healthy lifestyle.  Working with a diet and nutrition specialist (dietitian) can help you make a meal plan that is best for you.  Keep in mind that carbohydrates (carbs) and alcohol have immediate effects on your blood glucose levels. It is important to count carbs and to use alcohol carefully. This information is not intended to replace advice given to you by your health care provider. Make sure you discuss any questions you have with your health care provider. Document Revised: 09/14/2017 Document Reviewed: 11/06/2016 Elsevier Patient Education  2020 [employer]  DASH Eating Plan DASH stands for "Dietary Approaches to Stop Hypertension." The DASH eating plan is a healthy eating plan that has been shown to reduce high blood pressure (hypertension). It may also reduce your risk for type 2   diabetes, heart disease, and stroke. The DASH eating plan may also help with weight loss. What are tips for following this plan?  General guidelines  Avoid eating more than 2,300 mg (milligrams) of salt (sodium) a day. If you have hypertension, you may need to reduce your sodium intake to 1,500 mg a day.  Limit alcohol intake to no more than 1 drink a day for nonpregnant women and 2 drinks a day for men. One drink equals 12 oz of beer, 5 oz of wine, or 1 oz of hard liquor.  Work with your health care provider to maintain a healthy body weight or to lose weight. Ask what an ideal weight is for you.  Get at least 30 minutes of exercise that causes your heart to beat faster (aerobic exercise) most days of the week. Activities may include walking, swimming, or biking.  Work with your health care provider or diet and  nutrition specialist (dietitian) to adjust your eating plan to your individual calorie needs. Reading food labels   Check food labels for the amount of sodium per serving. Choose foods with less than 5 percent of the Daily Value of sodium. Generally, foods with less than 300 mg of sodium per serving fit into this eating plan.  To find whole grains, look for the word "whole" as the first word in the ingredient list. Shopping  Buy products labeled as "low-sodium" or "no salt added."  Buy fresh foods. Avoid canned foods and premade or frozen meals. Cooking  Avoid adding salt when cooking. Use salt-free seasonings or herbs instead of table salt or sea salt. Check with your health care provider or pharmacist before using salt substitutes.  Do not fry foods. Cook foods using healthy methods such as baking, boiling, grilling, and broiling instead.  Cook with heart-healthy oils, such as olive, canola, soybean, or sunflower oil. Meal planning  Eat a balanced diet that includes: ? 5 or more servings of fruits and vegetables each day. At each meal, try to fill half of your plate with fruits and vegetables. ? Up to 6-8 servings of whole grains each day. ? Less than 6 oz of lean meat, poultry, or fish each day. A 3-oz serving of meat is about the same size as a deck of cards. One egg equals 1 oz. ? 2 servings of low-fat dairy each day. ? A serving of nuts, seeds, or beans 5 times each week. ? Heart-healthy fats. Healthy fats called Omega-3 fatty acids are found in foods such as flaxseeds and coldwater fish, like sardines, salmon, and mackerel.  Limit how much you eat of the following: ? Canned or prepackaged foods. ? Food that is high in trans fat, such as fried foods. ? Food that is high in saturated fat, such as fatty meat. ? Sweets, desserts, sugary drinks, and other foods with added sugar. ? Full-fat dairy products.  Do not salt foods before eating.  Try to eat at least 2 vegetarian  meals each week.  Eat more home-cooked food and less restaurant, buffet, and fast food.  When eating at a restaurant, ask that your food be prepared with less salt or no salt, if possible. What foods are recommended? The items listed may not be a complete list. Talk with your dietitian about what dietary choices are best for you. Grains Whole-grain or whole-wheat bread. Whole-grain or whole-wheat pasta. Brown rice. Oatmeal. Quinoa. Bulgur. Whole-grain and low-sodium cereals. Pita bread. Low-fat, low-sodium crackers. Whole-wheat flour tortillas. Vegetables Fresh or frozen vegetables (  raw, steamed, roasted, or grilled). Low-sodium or reduced-sodium tomato and vegetable juice. Low-sodium or reduced-sodium tomato sauce and tomato paste. Low-sodium or reduced-sodium canned vegetables. Fruits All fresh, dried, or frozen fruit. Canned fruit in natural juice (without added sugar). Meat and other protein foods Skinless chicken or turkey. Ground chicken or turkey. Pork with fat trimmed off. Fish and seafood. Egg whites. Dried beans, peas, or lentils. Unsalted nuts, nut butters, and seeds. Unsalted canned beans. Lean cuts of beef with fat trimmed off. Low-sodium, lean deli meat. Dairy Low-fat (1%) or fat-free (skim) milk. Fat-free, low-fat, or reduced-fat cheeses. Nonfat, low-sodium ricotta or cottage cheese. Low-fat or nonfat yogurt. Low-fat, low-sodium cheese. Fats and oils Soft margarine without trans fats. Vegetable oil. Low-fat, reduced-fat, or light mayonnaise and salad dressings (reduced-sodium). Canola, safflower, olive, soybean, and sunflower oils. Avocado. Seasoning and other foods Herbs. Spices. Seasoning mixes without salt. Unsalted popcorn and pretzels. Fat-free sweets. What foods are not recommended? The items listed may not be a complete list. Talk with your dietitian about what dietary choices are best for you. Grains Baked goods made with fat, such as croissants, muffins, or some  breads. Dry pasta or rice meal packs. Vegetables Creamed or fried vegetables. Vegetables in a cheese sauce. Regular canned vegetables (not low-sodium or reduced-sodium). Regular canned tomato sauce and paste (not low-sodium or reduced-sodium). Regular tomato and vegetable juice (not low-sodium or reduced-sodium). Pickles. Olives. Fruits Canned fruit in a light or heavy syrup. Fried fruit. Fruit in cream or butter sauce. Meat and other protein foods Fatty cuts of meat. Ribs. Fried meat. Bacon. Sausage. Bologna and other processed lunch meats. Salami. Fatback. Hotdogs. Bratwurst. Salted nuts and seeds. Canned beans with added salt. Canned or smoked fish. Whole eggs or egg yolks. Chicken or turkey with skin. Dairy Whole or 2% milk, cream, and half-and-half. Whole or full-fat cream cheese. Whole-fat or sweetened yogurt. Full-fat cheese. Nondairy creamers. Whipped toppings. Processed cheese and cheese spreads. Fats and oils Butter. Stick margarine. Lard. Shortening. Ghee. Bacon fat. Tropical oils, such as coconut, palm kernel, or palm oil. Seasoning and other foods Salted popcorn and pretzels. Onion salt, garlic salt, seasoned salt, table salt, and sea salt. Worcestershire sauce. Tartar sauce. Barbecue sauce. Teriyaki sauce. Soy sauce, including reduced-sodium. Steak sauce. Canned and packaged gravies. Fish sauce. Oyster sauce. Cocktail sauce. Horseradish that you find on the shelf. Ketchup. Mustard. Meat flavorings and tenderizers. Bouillon cubes. Hot sauce and Tabasco sauce. Premade or packaged marinades. Premade or packaged taco seasonings. Relishes. Regular salad dressings. Where to find more information:  National Heart, Lung, and Blood Institute: www.nhlbi.nih.gov  American Heart Association: www.heart.org Summary  The DASH eating plan is a healthy eating plan that has been shown to reduce high blood pressure (hypertension). It may also reduce your risk for type 2 diabetes, heart disease, and  stroke.  With the DASH eating plan, you should limit salt (sodium) intake to 2,300 mg a day. If you have hypertension, you may need to reduce your sodium intake to 1,500 mg a day.  When on the DASH eating plan, aim to eat more fresh fruits and vegetables, whole grains, lean proteins, low-fat dairy, and heart-healthy fats.  Work with your health care provider or diet and nutrition specialist (dietitian) to adjust your eating plan to your individual calorie needs. This information is not intended to replace advice given to you by your health care provider. Make sure you discuss any questions you have with your health care provider. Document Revised: 09/14/2017 Document Reviewed: 09/25/2016   [employer]vier Patient Education  2020 [employer]   

## 2020-06-24 NOTE — Progress Notes (Signed)
Established Patient Office Visit  Subjective:  Patient ID: Robin Greene, female    DOB: [birth year]  Age: 64 y.o. MRN: [id number]  CC:  Chief Complaint  Patient presents with  . Hypertension  . Hyperlipidemia  . Diabetes    HPI Robin Greene presents for follow up on diabetes mellitus, hypertension, and hyperlipidemia.  Diabetes: Pt denies increased urination or thirst. Pt reports medication compliance. No hypoglycemic events. Checking glucose at home. FBS range 120s. She restarted Optavia last week. She is exercising more and is monitoring carbohydrates. Also requesting refill of Terconazole for occasional yeast infections related to diabetes.  HTN: Pt denies new onset chest pain, palpitations, dizziness or lower extremity swelling. Taking medications without side effects. Checks BP at home and reports recent systolic readings range in 140-150s and had some ankle swelling which she attributed to heat. But also likely due to not taking HCTZ, which she didn't realize she was out. Requesting refill today. Pt follows a low salt diet.  HLD: Pt has history of statin intolerance and taking Zetia. Staying active and increased physical activity.   Hypothyroid: Reports medication compliance with current treatment plan. Asymptomatic.    Past Medical History:  Diagnosis Date  . Anemia   . Arthritis   . Complication of anesthesia   . Diabetes mellitus    dx 2010  . GERD (gastroesophageal reflux disease)    occasional   will take OTC  . HSV-1 infection   . Hypertension   . PONV (postoperative nausea and vomiting)   . Vitamin D deficiency     Past Surgical History:  Procedure Laterality Date  . ABDOMINAL HYSTERECTOMY    . EYE SURGERY    . INSERTION OF MESH N/A 04/30/2017   Procedure: INSERTION OF MESH;  Surgeon: Jackolyn Confer, MD;  Location: [facility];  Service: General;  Laterality: N/A;  . OVARY SURGERY    . REFRACTIVE SURGERY    . UMBILICAL HERNIA REPAIR N/A 04/30/2017    Procedure: UMBILICAL HERNIA REPAIR WITH MESH;  Surgeon: Jackolyn Confer, MD;  Location: [facility] OR;  Service: General;  Laterality: N/A;    Family History  Problem Relation Age of Onset  . Sjogren's syndrome Mother   . Hypertension Father   . Hyperlipidemia Father   . Healthy Sister     Social History   Socioeconomic History  . Marital status: Married    Spouse name: Not on file  . Number of children: Not on file  . Years of education: Not on file  . Highest education level: Not on file  Occupational History  . Not on file  Tobacco Use  . Smoking status: Never Smoker  . Smokeless tobacco: Never Used  Substance and Sexual Activity  . Alcohol use: Yes    Comment: occasionally  . Drug use: No  . Sexual activity: Yes    Birth control/protection: Surgical  Other Topics Concern  . Not on file  Social History Narrative  . Not on file   Social Determinants of Health   Financial Resource Strain:   . Difficulty of Paying Living Expenses: Not on file  Food Insecurity:   . Worried About Charity fundraiser in the Last Year: Not on file  . Ran Out of Food in the Last Year: Not on file  Transportation Needs:   . Lack of Transportation (Medical): Not on file  . Lack of Transportation (Non-Medical): Not on file  Physical Activity:   . Days of Exercise per Week:  Not on file  . Minutes of Exercise per Session: Not on file  Stress:   . Feeling of Stress : Not on file  Social Connections:   . Frequency of Communication with Friends and Family: Not on file  . Frequency of Social Gatherings with Friends and Family: Not on file  . Attends Religious Services: Not on file  . Active Member of Clubs or Organizations: Not on file  . Attends Archivist Meetings: Not on file  . Marital Status: Not on file  Intimate Partner Violence:   . Fear of Current or Ex-Partner: Not on file  . Emotionally Abused: Not on file  . Physically Abused: Not on file  . Sexually Abused: Not on file     Outpatient Medications Prior to Visit  Medication Sig Dispense Refill  . ALPRAZolam (XANAX) 0.25 MG tablet Take 1 tablet (0.25 mg total) by mouth 3 (three) times daily as needed for anxiety. 30 tablet 0  . blood glucose meter kit and supplies Dispense based on patient and insurance preference. Use three to four times weekly as directed. (FOR ICD-10 E10.9, E11.9). 1 each 0  . cyanocobalamin 2000 MCG tablet Take 2,000 mcg by mouth daily.    . Dulaglutide (TRULICITY) 6.57 XU/3.8BF SOPN INJECT 1 PEN INTO THE SKIN ONCE WEEKLY 2 mL 2  . ezetimibe (ZETIA) 10 MG tablet Take 1 tablet (10 mg total) by mouth daily. 90 tablet 0  . Fexofenadine HCl (ALLEGRA ALLERGY PO) Take by mouth.    Marland Kitchen FLUoxetine (PROZAC) 20 MG tablet 1 tablet daily 90 tablet 0  . FOLIC ACID PO Take 1 tablet by mouth daily.    Marland Kitchen FREESTYLE LITE test strip USE TO CHECK BLOOD SUGAR 3 TO 4 TIMES A WEEK AS DIRECTED 100 strip 0  . glipiZIDE (GLUCOTROL XL) 5 MG 24 hr tablet TAKE 1 TABLET BY MOUTH ONCE A DAY WITH BREAKFAST 90 tablet 0  . Lancets (FREESTYLE) lancets USE TO CHECK BLOOD SUGAR 3 TO 4 TIMES A WEEK AS DIRECTED 100 each 0  . liothyronine (CYTOMEL) 5 MCG tablet TAKE 1 TABLET BY MOUTH TWO TIMES DAILY 120 tablet 0  . lisinopril (ZESTRIL) 40 MG tablet TAKE 1 TABLET BY MOUTH ONCE A DAY 90 tablet 0  . metFORMIN (GLUCOPHAGE) 1000 MG tablet TAKE 1 TABLET BY MOUTH TWO TIMES DAILY WITH A MEAL 180 tablet 0  . sitaGLIPtin (JANUVIA) 100 MG tablet Take 1 tablet (100 mg total) by mouth daily. 90 tablet 0  . SYNTHROID 100 MCG tablet TAKE 1 TABLET BY MOUTH ONCE DAILY BEFORE BREAKFAST 90 tablet 1  . valACYclovir (VALTREX) 1000 MG tablet Take 500 mg by mouth 2 (two) times daily as needed (fever blisters). 1/2 tablet twice daily for 5 days as needed for flares     . hydrochlorothiazide (HYDRODIURIL) 25 MG tablet TAKE 1 TABLET BY MOUTH ONCE DAILY 90 tablet 1  . terconazole (TERAZOL 7) 0.4 % vaginal cream Place 1 applicator vaginally at bedtime. 45 g 1   . metroNIDAZOLE (METROGEL) 1 % gel Apply 1 application topically daily as needed (rosacea).  (Patient not taking: Reported on 06/24/2020)     No facility-administered medications prior to visit.    Allergies  Allergen Reactions  . Asa [Aspirin] Anaphylaxis  . Crestor [Rosuvastatin Calcium] Other (See Comments)    Abnormal LFTs  . Claritin [Loratadine] Other (See Comments)    Fainted twice   . Invokana [Canagliflozin] Other (See Comments)    Genital yeast infections  .  Jardiance [Empagliflozin] Other (See Comments)    Genital yeast infection  . Lipitor [Atorvastatin] Other (See Comments)    Muscle aches   . Simvastatin Other (See Comments)    Muscle aches    ROS Review of Systems  A fourteen system review of systems was performed and found to be positive as per HPI.   Objective:    Physical Exam General: Well nourished, in no apparent distress. Eyes: PERRLA, EOMs, conjunctiva clr Resp: Respiratory effort- normal, ECTA B/L w/o W/R/R  Cardio: RRR S1 S2 Abdomen: no gross distention. Lymphatics:  less 2 sec cap RF M-sk: Full ROM, good strength, normal gait.  Skin: Warm, dry  Neuro: Alert, Oriented, no focal deficits Psych: Normal affect, Insight and Judgment appropriate.   BP (!) 164/83   Pulse 92   Ht 5' 5.5" (1.664 m)   Wt 179 lb 12.8 oz (81.6 kg)   SpO2 98%   BMI 29.47 kg/m  Wt Readings from Last 3 Encounters:  06/24/20 179 lb 12.8 oz (81.6 kg)  03/18/20 182 lb 3.2 oz (82.6 kg)  09/25/19 175 lb (79.4 kg)     Health Maintenance Due  Topic Date Due  . OPHTHALMOLOGY EXAM  Never done  . COVID-19 Vaccine (1) Never done  . DEXA SCAN  Never done  . PNA vac Low Risk Adult (2 of 2 - PPSV23) 04/24/2019  . COLONOSCOPY  12/29/2019  . INFLUENZA VACCINE  05/16/2020    There are no preventive care reminders to display for this patient.  Lab Results  Component Value Date   TSH 5.470 (H) 08/25/2019   Lab Results  Component Value Date   WBC 6.2 08/25/2019   HGB  12.3 08/25/2019   HCT 37.3 08/25/2019   MCV 85 08/25/2019   PLT 317 08/25/2019   Lab Results  Component Value Date   NA 136 03/16/2020   K 4.4 03/16/2020   CO2 22 03/16/2020   GLUCOSE 165 (H) 03/16/2020   BUN 12 03/16/2020   CREATININE 0.72 03/16/2020   BILITOT 0.2 03/16/2020   ALKPHOS 62 03/16/2020   AST 32 03/16/2020   ALT 30 03/16/2020   PROT 6.8 03/16/2020   ALBUMIN 4.4 03/16/2020   CALCIUM 9.5 03/16/2020   ANIONGAP 9 04/19/2017   Lab Results  Component Value Date   CHOL 200 (H) 03/16/2020   Lab Results  Component Value Date   HDL 61 03/16/2020   Lab Results  Component Value Date   LDLCALC 117 (H) 03/16/2020   Lab Results  Component Value Date   TRIG 126 03/16/2020   Lab Results  Component Value Date   CHOLHDL 3.3 03/16/2020   Lab Results  Component Value Date   HGBA1C 7.5 (A) 06/24/2020      Assessment & Plan:   Problem List Items Addressed This Visit      Cardiovascular and Mediastinum   Hypertension associated with diabetes ([place])-  with superimposed white coat HTN (Chronic)   Relevant Medications   hydrochlorothiazide (HYDRODIURIL) 25 MG tablet   Other Relevant Orders   Comp Met (CMET)     Endocrine   Hypothyroidism (Chronic)   Relevant Orders   TSH   T4, free   Hyperlipidemia associated with type 2 diabetes mellitus (HCC) (Chronic)    Other Visit Diagnoses    Type 2 diabetes mellitus without complication, unspecified whether long term insulin use (HCC)    -  Primary   Relevant Orders   POCT glycosylated hemoglobin (Hb A1C) (Completed)  Comp Met (CMET)   Vaginal candidiasis       Relevant Medications   terconazole (TERAZOL 7) 0.4 % vaginal cream     Type 2 diabetes mellitus without complication, unspecified whether long term insulin use: -A1c today stable, improved from prior -Continue current medication regimen. -Continue to monitor carbohydrates and follow low glucose diet. -Continue physical activity regimen. -Will continue  to monitor.  Hypertension associated with diabetes with white coat syndrome: -BP elevated and recent ambulatory BP readings above goal most likely due to not taking HCTZ (unintentional). Advise to continue with ambulatory BP monitoring and if consistently >140/90 notify the clinic for medication adjustments. -Continue current medication regimen. Provided needed refills. -Follow low sodium diet. -Checking CMP today for medication monitoring.  Hyperlipidemia associated with type 2 diabetes mellitus, Statin intolerance: -Last lipid panel: total cholesterol 200 mg/dL, LDL 117 mg/dL (goal < 70 mg/dL). -Continue current medication regimen. -Follow a heart healthy diet low in saturated and trans fat. -Plan to recheck lipid panel next OV.  Hypothyroidism: -Last TSH 5.470 mildly elevated, free T4 1.18 wnl -Rechecking thyroid labs today. -Continue current medication regimen. Pending labs will make dose adjustments if indicated.  -Will continue to monitor.   Meds ordered this encounter  Medications  . hydrochlorothiazide (HYDRODIURIL) 25 MG tablet    Sig: Take 1 tablet (25 mg total) by mouth daily.    Dispense:  90 tablet    Refill:  1  . terconazole (TERAZOL 7) 0.4 % vaginal cream    Sig: Place 1 applicator vaginally at bedtime.    Dispense:  45 g    Refill:  1    Follow-up: Return in about 3 months (around 09/23/2020) for DM, HTN, HLD, Mood and FBW (lipid panel, a1c, cmp).   Note:  This note was prepared with assistance of Dragon voice recognition software. Occasional wrong-word or sound-a-like substitutions may have occurred due to the inherent limitations of voice recognition software.   Lorrene Reid, PA-C

## 2020-06-25 LAB — COMPREHENSIVE METABOLIC PANEL
ALT: 27 IU/L (ref 0–32)
AST: 26 IU/L (ref 0–40)
Albumin/Globulin Ratio: 2.2 (ref 1.2–2.2)
Albumin: 4.9 g/dL — ABNORMAL HIGH (ref 3.8–4.8)
Alkaline Phosphatase: 72 IU/L (ref 48–121)
BUN/Creatinine Ratio: 22 (ref 12–28)
BUN: 16 mg/dL (ref 8–27)
Bilirubin Total: 0.4 mg/dL (ref 0.0–1.2)
CO2: 24 mmol/L (ref 20–29)
Calcium: 10.3 mg/dL (ref 8.7–10.3)
Chloride: 99 mmol/L (ref 96–106)
Creatinine, Ser: 0.73 mg/dL (ref 0.57–1.00)
GFR calc Af Amer: 99 mL/min/{1.73_m2} (ref >59)
GFR calc non Af Amer: 86 mL/min/{1.73_m2} (ref >59)
Globulin, Total: 2.2 g/dL (ref 1.5–4.5)
Glucose: 144 mg/dL — ABNORMAL HIGH (ref 65–99)
Potassium: 4.7 mmol/L (ref 3.5–5.2)
Sodium: 139 mmol/L (ref 134–144)
Total Protein: 7.1 g/dL (ref 6.0–8.5)

## 2020-06-25 LAB — TSH: TSH: 7.81 u[IU]/mL — ABNORMAL HIGH (ref 0.450–4.500)

## 2020-06-25 LAB — T4, FREE: Free T4: 1.03 ng/dL (ref 0.82–1.77)

## 2020-07-06 ENCOUNTER — Other Ambulatory Visit: Payer: Self-pay | Admitting: Physician Assistant

## 2020-07-06 DIAGNOSIS — F4323 Adjustment disorder with mixed anxiety and depressed mood: Secondary | ICD-10-CM

## 2020-07-06 DIAGNOSIS — E119 Type 2 diabetes mellitus without complications: Secondary | ICD-10-CM

## 2020-07-06 DIAGNOSIS — F430 Acute stress reaction: Secondary | ICD-10-CM

## 2020-07-06 MED FILL — JANUVIA 100 MG TABLET: 100 | 30 days supply | Qty: 30 | Fill #0

## 2020-07-06 MED FILL — SYNTHROID 100 MCG TABLET: 100 | 90 days supply | Qty: 90 | Fill #1

## 2020-07-06 MED FILL — FLUOXETINE HCL 20 MG TABS: 20 | 90 days supply | Qty: 90 | Fill #0

## 2020-07-30 ENCOUNTER — Other Ambulatory Visit (HOSPITAL_COMMUNITY): Payer: Self-pay | Admitting: Obstetrics and Gynecology

## 2020-07-30 MED FILL — FLUCONAZOLE 150 MG TABS: 150 | 1 days supply | Qty: 1 | Fill #0

## 2020-08-05 ENCOUNTER — Other Ambulatory Visit: Payer: Self-pay | Admitting: Physician Assistant

## 2020-08-05 DIAGNOSIS — E119 Type 2 diabetes mellitus without complications: Secondary | ICD-10-CM

## 2020-08-05 MED FILL — GLIPIZIDE ER 5 MG TB24: 5 | 90 days supply | Qty: 90 | Fill #0

## 2020-08-05 MED FILL — ESTRADIOL 0.1 MG/GM CREA: 0.1 | 42 days supply | Qty: 43 | Fill #2

## 2020-08-05 MED FILL — EZETIMIBE 10 MG TABS: 10 | 90 days supply | Qty: 90 | Fill #0

## 2020-08-05 MED FILL — JANUVIA 100 MG TABLET: 100 | 30 days supply | Qty: 30 | Fill #0

## 2020-08-20 ENCOUNTER — Other Ambulatory Visit: Payer: Self-pay | Admitting: Physician Assistant

## 2020-08-23 ENCOUNTER — Other Ambulatory Visit: Payer: Self-pay | Admitting: Physician Assistant

## 2020-08-23 MED FILL — LISINOPRIL 40 MG TABS: 40 | 90 days supply | Qty: 90 | Fill #0

## 2020-09-02 ENCOUNTER — Other Ambulatory Visit: Payer: Self-pay | Admitting: Physician Assistant

## 2020-09-02 MED FILL — LIOTHYRONINE SODIUM 5 MCG T: 5 | 60 days supply | Qty: 120 | Fill #0

## 2020-09-06 ENCOUNTER — Encounter: Payer: Self-pay | Admitting: Physician Assistant

## 2020-09-13 ENCOUNTER — Other Ambulatory Visit: Payer: Self-pay | Admitting: Physician Assistant

## 2020-09-13 DIAGNOSIS — E119 Type 2 diabetes mellitus without complications: Secondary | ICD-10-CM

## 2020-09-13 MED FILL — METFORMIN HCL 1000 MG TABS: 1000 | 90 days supply | Qty: 180 | Fill #0

## 2020-09-13 MED FILL — JANUVIA 100 MG TABLET: 100 | 30 days supply | Qty: 30 | Fill #0

## 2020-09-29 MED FILL — HYDROCHLOROTHIAZIDE 25 MG T: 25 | 90 days supply | Qty: 90 | Fill #1

## 2020-09-30 ENCOUNTER — Other Ambulatory Visit: Payer: Self-pay | Admitting: Physician Assistant

## 2020-09-30 DIAGNOSIS — E1169 Type 2 diabetes mellitus with other specified complication: Secondary | ICD-10-CM

## 2020-09-30 MED FILL — TRULICITY 0.75 MG/0.5 ML PE: 0.75 | 84 days supply | Qty: 6 | Fill #0

## 2020-10-07 ENCOUNTER — Other Ambulatory Visit: Payer: Self-pay | Admitting: Physician Assistant

## 2020-10-07 DIAGNOSIS — F430 Acute stress reaction: Secondary | ICD-10-CM

## 2020-10-07 DIAGNOSIS — F4323 Adjustment disorder with mixed anxiety and depressed mood: Secondary | ICD-10-CM

## 2020-10-07 MED FILL — FLUoxetine HCL 20 MG TABS: 20 | 60 days supply | Qty: 60 | Fill #0

## 2020-10-07 MED FILL — ESTRADIOL 0.1 MG/GM CREA: 0.1 | 42 days supply | Qty: 43 | Fill #3

## 2020-10-16 DIAGNOSIS — F32A Depression, unspecified: Secondary | ICD-10-CM

## 2020-10-16 DIAGNOSIS — Z8719 Personal history of other diseases of the digestive system: Secondary | ICD-10-CM

## 2020-10-16 HISTORY — DX: Personal history of other diseases of the digestive system: Z87.19

## 2020-10-16 HISTORY — DX: Depression, unspecified: F32.A

## 2020-10-18 ENCOUNTER — Other Ambulatory Visit: Payer: Self-pay | Admitting: Physician Assistant

## 2020-10-18 ENCOUNTER — Telehealth: Payer: Self-pay | Admitting: Physician Assistant

## 2020-10-18 DIAGNOSIS — E039 Hypothyroidism, unspecified: Secondary | ICD-10-CM

## 2020-10-18 MED FILL — SYNTHROID 100 MCG TABLET: 100 | 90 days supply | Qty: 90 | Fill #1

## 2020-10-18 NOTE — Telephone Encounter (Signed)
Please call patient to schedule apt per last AVS for med refills. AS, CMA 

## 2020-10-19 NOTE — Telephone Encounter (Signed)
Patient is scheduled   

## 2020-10-20 ENCOUNTER — Other Ambulatory Visit: Payer: Self-pay | Admitting: Nurse Practitioner

## 2020-10-20 ENCOUNTER — Telehealth: Payer: No Typology Code available for payment source | Admitting: Nurse Practitioner

## 2020-10-20 DIAGNOSIS — R059 Cough, unspecified: Secondary | ICD-10-CM | POA: Diagnosis not present

## 2020-10-20 MED ORDER — BENZONATATE 100 MG PO CAPS: 100.0000 mg | ORAL_CAPSULE | Freq: Three times a day (TID) | ORAL | 0 refills | Status: DC | PRN

## 2020-10-20 MED ORDER — AZITHROMYCIN 250 MG PO TABS: ORAL_TABLET | ORAL | 0 refills | Status: DC

## 2020-10-20 MED FILL — BENZONATATE 100 MG CAPS: 100 | 6 days supply | Qty: 20 | Fill #0

## 2020-10-20 MED FILL — AZITHROMYCIN 250 MG TABLET: 250 | 5 days supply | Qty: 6 | Fill #0

## 2020-10-20 NOTE — Progress Notes (Signed)
We are sorry that you are not feeling well.  Here is how we plan to help!  Based on your presentation I believe you most likely have A cough due to bacteria.  When patients have a fever and a productive cough with a change in color or increased sputum production, we are concerned about bacterial bronchitis.  If left untreated it can progress to pneumonia.  If your symptoms do not improve with your treatment plan it is important that you contact your provider.   I have prescribed Azithromyin 250 mg: two tablets now and then one tablet daily for 4 additonal days    In addition you may use A prescription cough medication called Tessalon Perles 100mg. You may take 1-2 capsules every 8 hours as needed for your cough.   From your responses in the eVisit questionnaire you describe inflammation in the upper respiratory tract which is causing a significant cough.  This is commonly called Bronchitis and has four common causes:    Allergies  Viral Infections  Acid Reflux  Bacterial Infection Allergies, viruses and acid reflux are treated by controlling symptoms or eliminating the cause. An example might be a cough caused by taking certain blood pressure medications. You stop the cough by changing the medication. Another example might be a cough caused by acid reflux. Controlling the reflux helps control the cough.  USE OF BRONCHODILATOR ("RESCUE") INHALERS: There is a risk from using your bronchodilator too frequently.  The risk is that over-reliance on a medication which only relaxes the muscles surrounding the breathing tubes can reduce the effectiveness of medications prescribed to reduce swelling and congestion of the tubes themselves.  Although you feel brief relief from the bronchodilator inhaler, your asthma may actually be worsening with the tubes becoming more swollen and filled with mucus.  This can delay other crucial treatments, such as oral steroid medications. If you need to use a  bronchodilator inhaler daily, several times per day, you should discuss this with your provider.  There are probably better treatments that could be used to keep your asthma under control.     HOME CARE . Only take medications as instructed by your medical team. . Complete the entire course of an antibiotic. . Drink plenty of fluids and get plenty of rest. . Avoid close contacts especially the very young and the elderly . Cover your mouth if you cough or cough into your sleeve. . Always remember to wash your hands . A steam or ultrasonic humidifier can help congestion.   GET HELP RIGHT AWAY IF: . You develop worsening fever. . You become short of breath . You cough up blood. . Your symptoms persist after you have completed your treatment plan MAKE SURE YOU   Understand these instructions.  Will watch your condition.  Will get help right away if you are not doing well or get worse.  Your e-visit answers were reviewed by a board certified advanced clinical practitioner to complete your personal care plan.  Depending on the condition, your plan could have included both over the counter or prescription medications. If there is a problem please reply  once you have received a response from your provider. Your safety is important to us.  If you have drug allergies check your prescription carefully.    You can use MyChart to ask questions about today's visit, request a non-urgent call back, or ask for a work or school excuse for 24 hours related to this e-Visit. If it has been   greater than 24 hours you will need to follow up with your provider, or enter a new e-Visit to address those concerns. You will get an e-mail in the next two days asking about your experience.  I hope that your e-visit has been valuable and will speed your recovery. Thank you for using e-visits.  5-10 minutes spent reviewing and documenting in chart.  

## 2020-11-01 ENCOUNTER — Other Ambulatory Visit: Payer: Self-pay | Admitting: Physician Assistant

## 2020-11-01 MED FILL — LIOTHYRONINE SODIUM 5 MCG T: 5 | 60 days supply | Qty: 120 | Fill #0

## 2020-11-15 ENCOUNTER — Ambulatory Visit (INDEPENDENT_AMBULATORY_CARE_PROVIDER_SITE_OTHER): Payer: No Typology Code available for payment source | Admitting: Physician Assistant

## 2020-11-15 ENCOUNTER — Encounter: Payer: Self-pay | Admitting: Physician Assistant

## 2020-11-15 ENCOUNTER — Other Ambulatory Visit: Payer: Self-pay

## 2020-11-15 ENCOUNTER — Other Ambulatory Visit: Payer: Self-pay | Admitting: Physician Assistant

## 2020-11-15 VITALS — BP 147/82 | HR 96 | Temp 98.4°F | Ht 65.5 in | Wt 181.7 lb

## 2020-11-15 DIAGNOSIS — I152 Hypertension secondary to endocrine disorders: Secondary | ICD-10-CM

## 2020-11-15 DIAGNOSIS — E119 Type 2 diabetes mellitus without complications: Secondary | ICD-10-CM

## 2020-11-15 DIAGNOSIS — Z1283 Encounter for screening for malignant neoplasm of skin: Secondary | ICD-10-CM

## 2020-11-15 DIAGNOSIS — E1169 Type 2 diabetes mellitus with other specified complication: Secondary | ICD-10-CM

## 2020-11-15 DIAGNOSIS — Z1211 Encounter for screening for malignant neoplasm of colon: Secondary | ICD-10-CM

## 2020-11-15 DIAGNOSIS — E663 Overweight: Secondary | ICD-10-CM

## 2020-11-15 DIAGNOSIS — E1159 Type 2 diabetes mellitus with other circulatory complications: Secondary | ICD-10-CM

## 2020-11-15 DIAGNOSIS — M7551 Bursitis of right shoulder: Secondary | ICD-10-CM

## 2020-11-15 LAB — POCT GLYCOSYLATED HEMOGLOBIN (HGB A1C): Hemoglobin A1C: 8.5 % — AB (ref 4.0–5.6)

## 2020-11-15 MED ORDER — SITAGLIPTIN PHOSPHATE 100 MG PO TABS: 100.0000 mg | ORAL_TABLET | Freq: Every day | ORAL | 1 refills | Status: DC

## 2020-11-15 MED ORDER — OZEMPIC (0.25 OR 0.5 MG/DOSE) 2 MG/1.5ML ~~LOC~~ SOPN: PEN_INJECTOR | SUBCUTANEOUS | 3 refills | Status: DC

## 2020-11-15 MED FILL — OZEMPIC 0.25 OR 0.5 MG/DOSE: 2 | 42 days supply | Qty: 2 | Fill #0

## 2020-11-15 MED FILL — JANUVIA 100 MG TABLET: 100 | 30 days supply | Qty: 30 | Fill #0

## 2020-11-15 NOTE — Patient Instructions (Signed)
Diabetes Mellitus and Nutrition, Adult When you have diabetes, or diabetes mellitus, it is very important to have healthy eating habits because your blood sugar (glucose) levels are greatly affected by what you eat and drink. Eating healthy foods in the right amounts, at about the same times every day, can help you:  Control your blood glucose.  Lower your risk of heart disease.  Improve your blood pressure.  Reach or maintain a healthy weight. What can affect my meal plan? Every person with diabetes is different, and each person has different needs for a meal plan. Your health care provider may recommend that you work with a dietitian to make a meal plan that is best for you. Your meal plan may vary depending on factors such as:  The calories you need.  The medicines you take.  Your weight.  Your blood glucose, blood pressure, and cholesterol levels.  Your activity level.  Other health conditions you have, such as heart or kidney disease. How do carbohydrates affect me? Carbohydrates, also called carbs, affect your blood glucose level more than any other type of food. Eating carbs naturally raises the amount of glucose in your blood. Carb counting is a method for keeping track of how many carbs you eat. Counting carbs is important to keep your blood glucose at a healthy level, especially if you use insulin or take certain oral diabetes medicines. It is important to know how many carbs you can safely have in each meal. This is different for every person. Your dietitian can help you calculate how many carbs you should have at each meal and for each snack. How does alcohol affect me? Alcohol can cause a sudden decrease in blood glucose (hypoglycemia), especially if you use insulin or take certain oral diabetes medicines. Hypoglycemia can be a life-threatening condition. Symptoms of hypoglycemia, such as sleepiness, dizziness, and confusion, are similar to symptoms of having too much  alcohol.  Do not drink alcohol if: ? Your health care provider tells you not to drink. ? You are pregnant, may be pregnant, or are planning to become pregnant.  If you drink alcohol: ? Do not drink on an empty stomach. ? Limit how much you use to:  0-1 drink a day for women.  0-2 drinks a day for men. ? Be aware of how much alcohol is in your drink. In the [place] one drink equals one 12 oz bottle of beer (355 mL), one 5 oz glass of wine (148 mL), or one 1 oz glass of hard liquor (44 mL). ? Keep yourself hydrated with water, diet soda, or unsweetened iced tea.  Keep in mind that regular soda, juice, and other mixers may contain a lot of sugar and must be counted as carbs. What are tips for following this plan? Reading food labels  Start by checking the serving size on the "Nutrition Facts" label of packaged foods and drinks. The amount of calories, carbs, fats, and other nutrients listed on the label is based on one serving of the item. Many items contain more than one serving per package.  Check the total grams (g) of carbs in one serving. You can calculate the number of servings of carbs in one serving by dividing the total carbs by 15. For example, if a food has 30 g of total carbs per serving, it would be equal to 2 servings of carbs.  Check the number of grams (g) of saturated fats and trans fats in one serving. Choose foods that have   a low amount or none of these fats.  Check the number of milligrams (mg) of salt (sodium) in one serving. Most people should limit total sodium intake to less than 2,300 mg per day.  Always check the nutrition information of foods labeled as "low-fat" or "nonfat." These foods may be higher in added sugar or refined carbs and should be avoided.  Talk to your dietitian to identify your daily goals for nutrients listed on the label. Shopping  Avoid buying canned, pre-made, or processed foods. These foods tend to be high in fat, sodium, and added  sugar.  Shop around the outside edge of the grocery store. This is where you will most often find fresh fruits and vegetables, bulk grains, fresh meats, and fresh dairy. Cooking  Use low-heat cooking methods, such as baking, instead of high-heat cooking methods like deep frying.  Cook using healthy oils, such as olive, canola, or sunflower oil.  Avoid cooking with butter, cream, or high-fat meats. Meal planning  Eat meals and snacks regularly, preferably at the same times every day. Avoid going long periods of time without eating.  Eat foods that are high in fiber, such as fresh fruits, vegetables, beans, and whole grains. Talk with your dietitian about how many servings of carbs you can eat at each meal.  Eat 4-6 oz (112-168 g) of lean protein each day, such as lean meat, chicken, fish, eggs, or tofu. One ounce (oz) of lean protein is equal to: ? 1 oz (28 g) of meat, chicken, or fish. ? 1 egg. ?  cup (62 g) of tofu.  Eat some foods each day that contain healthy fats, such as avocado, nuts, seeds, and fish.   What foods should I eat? Fruits Berries. Apples. Oranges. Peaches. Apricots. Plums. Grapes. Mango. Papaya. Pomegranate. Kiwi. Cherries. Vegetables Lettuce. Spinach. Leafy greens, including kale, chard, collard greens, and mustard greens. Beets. Cauliflower. Cabbage. Broccoli. Carrots. Green beans. Tomatoes. Peppers. Onions. Cucumbers. Brussels sprouts. Grains Whole grains, such as whole-wheat or whole-grain bread, crackers, tortillas, cereal, and pasta. Unsweetened oatmeal. Quinoa. Brown or wild rice. Meats and other proteins Seafood. Poultry without skin. Lean cuts of poultry and beef. Tofu. Nuts. Seeds. Dairy Low-fat or fat-free dairy products such as milk, yogurt, and cheese. The items listed above may not be a complete list of foods and beverages you can eat. Contact a dietitian for more information. What foods should I avoid? Fruits Fruits canned with  syrup. Vegetables Canned vegetables. Frozen vegetables with butter or cream sauce. Grains Refined white flour and flour products such as bread, pasta, snack foods, and cereals. Avoid all processed foods. Meats and other proteins Fatty cuts of meat. Poultry with skin. Breaded or fried meats. Processed meat. Avoid saturated fats. Dairy Full-fat yogurt, cheese, or milk. Beverages Sweetened drinks, such as soda or iced tea. The items listed above may not be a complete list of foods and beverages you should avoid. Contact a dietitian for more information. Questions to ask a health care provider  Do I need to meet with a diabetes educator?  Do I need to meet with a dietitian?  What number can I call if I have questions?  When are the best times to check my blood glucose? Where to find more information:  American Diabetes Association: diabetes.org  Academy of Nutrition and Dietetics: www.eatright.org  National Institute of Diabetes and Digestive and Kidney Diseases: www.niddk.nih.gov  Association of Diabetes Care and Education Specialists: www.diabeteseducator.org Summary  It is important to have healthy eating   habits because your blood sugar (glucose) levels are greatly affected by what you eat and drink.  A healthy meal plan will help you control your blood glucose and maintain a healthy lifestyle.  Your health care provider may recommend that you work with a dietitian to make a meal plan that is best for you.  Keep in mind that carbohydrates (carbs) and alcohol have immediate effects on your blood glucose levels. It is important to count carbs and to use alcohol carefully. This information is not intended to replace advice given to you by your health care provider. Make sure you discuss any questions you have with your health care provider. Document Revised: 09/09/2019 Document Reviewed: 09/09/2019 Elsevier Patient Education  2021 [employer]  

## 2020-11-15 NOTE — Progress Notes (Signed)
Established Patient Office Visit  Subjective:  Patient ID: Robin Greene, female    DOB: Aug 22, 1956  Age: 65 y.o. MRN: [id number]  CC:  Chief Complaint  Patient presents with  . Diabetes  . Hypertension  . Hypothyroidism  . Hyperlipidemia    HPI Robin Greene presents for follow up on diabetes mellitus and hypertension. Patient requesting several referrals- Dermatology (Dr. Allyson Sabal, Orthopedics (Dr. Amedeo Plenty), and gastroenterology [facility]- Dr. Cristina Gong).  Diabetes: Pt denies increased urination or thirst. Pt reports medication compliance. No hypoglycemic events. Checking glucose at home. FBS average 180-200. Started Optavia diet 2 weeks ago and has lost 5 pounds since then according to her home scale. Has been challenging to lose weight.  HTN: Pt denies chest pain, palpitations, dizziness or leg swelling. Taking medication as directed without side effects. Checks BP at home and readings range 130-140/80s. Pt follows a low salt diet.    Past Medical History:  Diagnosis Date  . Anemia   . Arthritis   . Complication of anesthesia   . Diabetes mellitus    dx 2010  . GERD (gastroesophageal reflux disease)    occasional   will take OTC  . HSV-1 infection   . Hypertension   . PONV (postoperative nausea and vomiting)   . Vitamin D deficiency     Past Surgical History:  Procedure Laterality Date  . ABDOMINAL HYSTERECTOMY    . EYE SURGERY    . INSERTION OF MESH N/A 04/30/2017   Procedure: INSERTION OF MESH;  Surgeon: Jackolyn Confer, MD;  Location: [facility];  Service: General;  Laterality: N/A;  . OVARY SURGERY    . REFRACTIVE SURGERY    . UMBILICAL HERNIA REPAIR N/A 04/30/2017   Procedure: UMBILICAL HERNIA REPAIR WITH MESH;  Surgeon: Jackolyn Confer, MD;  Location: [facility] OR;  Service: General;  Laterality: N/A;    Family History  Problem Relation Age of Onset  . Sjogren's syndrome Mother   . Hypertension Father   . Hyperlipidemia Father   . Healthy Sister     Social  History   Socioeconomic History  . Marital status: Married    Spouse name: Not on file  . Number of children: Not on file  . Years of education: Not on file  . Highest education level: Not on file  Occupational History  . Not on file  Tobacco Use  . Smoking status: Never Smoker  . Smokeless tobacco: Never Used  Substance and Sexual Activity  . Alcohol use: Yes    Comment: occasionally  . Drug use: No  . Sexual activity: Yes    Birth control/protection: Surgical  Other Topics Concern  . Not on file  Social History Narrative  . Not on file   Social Determinants of Health   Financial Resource Strain: Not on file  Food Insecurity: Not on file  Transportation Needs: Not on file  Physical Activity: Not on file  Stress: Not on file  Social Connections: Not on file  Intimate Partner Violence: Not on file    Outpatient Medications Prior to Visit  Medication Sig Dispense Refill  . ALPRAZolam (XANAX) 0.25 MG tablet Take 1 tablet (0.25 mg total) by mouth 3 (three) times daily as needed for anxiety. 30 tablet 0  . blood glucose meter kit and supplies Dispense based on patient and insurance preference. Use three to four times weekly as directed. (FOR ICD-10 E10.9, E11.9). 1 each 0  . cyanocobalamin 2000 MCG tablet Take 2,000 mcg by mouth daily.    Marland Kitchen  Fexofenadine HCl (ALLEGRA ALLERGY PO) Take by mouth.    Marland Kitchen FLUoxetine (PROZAC) 20 MG tablet Take 1 tablet (20 mg total) by mouth daily. TAKE 1 TABLET BY MOUTH ONCE DAILY**NEEDS APT FOR REFILLS** 60 tablet 0  . FREESTYLE LITE test strip USE TO CHECK BLOOD SUGAR 3 TO 4 TIMES A WEEK AS DIRECTED 100 strip 0  . hydrochlorothiazide (HYDRODIURIL) 25 MG tablet Take 1 tablet (25 mg total) by mouth daily. 90 tablet 1  . Lancets (FREESTYLE) lancets USE TO CHECK BLOOD SUGAR 3 TO 4 TIMES A WEEK AS DIRECTED 100 each 0  . liothyronine (CYTOMEL) 5 MCG tablet TAKE 1 TABLET BY MOUTH TWICE DAILY 120 tablet 0  . lisinopril (ZESTRIL) 40 MG tablet TAKE 1 TABLET  BY MOUTH ONCE A DAY 90 tablet 0  . metFORMIN (GLUCOPHAGE) 1000 MG tablet TAKE 1 TABLET BY MOUTH TWICE DAILY WITH A MEAL 180 tablet 0  . metroNIDAZOLE (METROGEL) 1 % gel Apply 1 application topically daily as needed (rosacea).    . SYNTHROID 100 MCG tablet Take 1 tablet (100 mcg total) by mouth daily before breakfast. **NEEDS APT FOR REFILLS** 60 tablet 0  . terconazole (TERAZOL 7) 0.4 % vaginal cream Place 1 applicator vaginally at bedtime. 45 g 1  . valACYclovir (VALTREX) 1000 MG tablet Take 500 mg by mouth 2 (two) times daily as needed (fever blisters). 1/2 tablet twice daily for 5 days as needed for flares    . ezetimibe (ZETIA) 10 MG tablet TAKE 1 TABLET BY MOUTH ONCE DAILY 90 tablet 0  . glipiZIDE (GLUCOTROL XL) 5 MG 24 hr tablet TAKE 1 TABLET BY MOUTH ONCE A DAY WITH BREAKFAST 90 tablet 0  . sitaGLIPtin (JANUVIA) 100 MG tablet Take 1 tablet (100 mg total) by mouth daily. 90 tablet 0  . TRULICITY 8.88 BV/6.9IH SOPN INJECT 1 PEN INTO THE SKIN ONCE WEEKLY 6 mL 2  . azithromycin (ZITHROMAX Z-PAK) 250 MG tablet As directed (Patient not taking: Reported on 11/15/2020) 6 tablet 0  . benzonatate (TESSALON PERLES) 100 MG capsule Take 1 capsule (100 mg total) by mouth 3 (three) times daily as needed for cough. (Patient not taking: Reported on 11/15/2020) 20 capsule 0  . FOLIC ACID PO Take 1 tablet by mouth daily. (Patient not taking: Reported on 11/15/2020)     No facility-administered medications prior to visit.    Allergies  Allergen Reactions  . Asa [Aspirin] Anaphylaxis  . Crestor [Rosuvastatin Calcium] Other (See Comments)    Abnormal LFTs  . Claritin [Loratadine] Other (See Comments)    Fainted twice   . Invokana [Canagliflozin] Other (See Comments)    Genital yeast infections  . Jardiance [Empagliflozin] Other (See Comments)    Genital yeast infection  . Lipitor [Atorvastatin] Other (See Comments)    Muscle aches   . Simvastatin Other (See Comments)    Muscle aches    ROS Review  of Systems A fourteen system review of systems was performed and found to be positive as per HPI.   Objective:    Physical Exam General:  Pleasant and cooperative, in no acute distress Neuro:  Alert and oriented,  extra-ocular muscles intact  HEENT:  Normocephalic, atraumatic, neck supple Skin:  Warm, dry. Cardiac:  RRR, S1 S2 w/o murmur Respiratory:  ECTA B/L w/o wheezing, Not using accessory muscles, speaking in full sentences- unlabored. Vascular:  Ext warm, no cyanosis apprec.; no gross edema Psych:  No HI/SI, judgement and insight good, Euthymic mood. Full Affect.   BP Marland Kitchen)  147/82   Pulse 96   Temp 98.4 F (36.9 C)   Ht 5' 5.5" (1.664 m)   Wt 181 lb 11.2 oz (82.4 kg)   LMP  (LMP Unknown)   SpO2 97%   BMI 29.78 kg/m  Wt Readings from Last 3 Encounters:  11/15/20 181 lb 11.2 oz (82.4 kg)  06/24/20 179 lb 12.8 oz (81.6 kg)  03/18/20 182 lb 3.2 oz (82.6 kg)     Health Maintenance Due  Topic Date Due  . COVID-19 Vaccine (1) Never done  . OPHTHALMOLOGY EXAM  Never done  . DEXA SCAN  Never done  . PNA vac Low Risk Adult (2 of 2 - PPSV23) 04/24/2019  . COLONOSCOPY (Pts 45-18yr Insurance coverage will need to be confirmed)  12/29/2019  . INFLUENZA VACCINE  05/16/2020    There are no preventive care reminders to display for this patient.  Lab Results  Component Value Date   TSH 7.810 (H) 06/24/2020   Lab Results  Component Value Date   WBC 6.2 08/25/2019   HGB 12.3 08/25/2019   HCT 37.3 08/25/2019   MCV 85 08/25/2019   PLT 317 08/25/2019   Lab Results  Component Value Date   NA 139 06/24/2020   K 4.7 06/24/2020   CO2 24 06/24/2020   GLUCOSE 144 (H) 06/24/2020   BUN 16 06/24/2020   CREATININE 0.73 06/24/2020   BILITOT 0.4 06/24/2020   ALKPHOS 72 06/24/2020   AST 26 06/24/2020   ALT 27 06/24/2020   PROT 7.1 06/24/2020   ALBUMIN 4.9 (H) 06/24/2020   CALCIUM 10.3 06/24/2020   ANIONGAP 9 04/19/2017   Lab Results  Component Value Date   CHOL 200 (H)  03/16/2020   Lab Results  Component Value Date   HDL 61 03/16/2020   Lab Results  Component Value Date   LDLCALC 117 (H) 03/16/2020   Lab Results  Component Value Date   TRIG 126 03/16/2020   Lab Results  Component Value Date   CHOLHDL 3.3 03/16/2020   Lab Results  Component Value Date   HGBA1C 8.5 (A) 11/15/2020      Assessment & Plan:   Problem List Items Addressed This Visit      Cardiovascular and Mediastinum   Hypertension associated with diabetes ([place]-  with superimposed white coat HTN (Chronic)   Relevant Medications   sitaGLIPtin (JANUVIA) 100 MG tablet   Semaglutide,0.25 or 0.5MG/DOS, (OZEMPIC, 0.25 OR 0.5 MG/DOSE,) 2 MG/1.5ML SOPN     Endocrine   Type 2 diabetes mellitus with other specified complication (HCC) - Primary   Relevant Medications   sitaGLIPtin (JANUVIA) 100 MG tablet   Semaglutide,0.25 or 0.5MG/DOS, (OZEMPIC, 0.25 OR 0.5 MG/DOSE,) 2 MG/1.5ML SOPN   Other Relevant Orders   POCT glycosylated hemoglobin (Hb A1C) (Completed)     Other   Overweight (BMI 25.0-29.9) (Chronic)    Other Visit Diagnoses    Acute bursitis of right shoulder       Relevant Orders   Ambulatory referral to Orthopedics   Screening for colon cancer       Relevant Orders   Ambulatory referral to Gastroenterology   Skin cancer screening       Relevant Orders   Ambulatory referral to Dermatology     Type 2 diabetes mellitus with other specified complication: -AV6Ptoday 8.5, increased from 7.5.  Continues to be above goal.  Discussed with patient medication adjustments-discontinuing Trulicity and starting a GLP-1 such as Ozempic.  No personal history or family  history of MEN or MTC.  Discussed risk versus benefit including potential side effects.  Patient is agreeable.  Recommend to take first dose of Ozempic at the next scheduled dose of Trulicity.  Patient verbalized understanding. -Continue glipizide, Metformin and Januvia. -Encouraged to continue with weight loss  efforts.  Monitor/reduce carbohydrates and glucose, increase physical activity level. -Will continue to monitor.  Hypertension associated with diabetes-with superimposed whitecoat syndrome: -BP mildly elevated.  Patient has history of whitecoat syndrome.  Ambulatory BP readings suboptimally controlled. -Continue current medication regimen. -Recommend to follow a low-sodium diet and stay well-hydrated. -Will continue to monitor.  Overweight (BMI 25.0-29.9): -Associated with diabetes mellitus, hypertension and hyperlipidemia. -Continue weight loss efforts with dietary changes and lifestyle modifications.  .    Meds ordered this encounter  Medications  . sitaGLIPtin (JANUVIA) 100 MG tablet    Sig: Take 1 tablet (100 mg total) by mouth daily.    Dispense:  90 tablet    Refill:  1  . Semaglutide,0.25 or 0.5MG/DOS, (OZEMPIC, 0.25 OR 0.5 MG/DOSE,) 2 MG/1.5ML SOPN    Sig: Inject 0.25 mg [place] once weekly x 4 weeks. Then inject 0.5 mg once weekly.    Dispense:  1.5 mL    Refill:  3    Order Specific Question:   Supervising Provider    Answer:   Beatrice Lecher D [[id number]]    Follow-up: Return in about 3 months (around 02/12/2021) for DM- changed med, HTN, HLD and FBW few days prior .    Lorrene Reid, PA-C

## 2020-11-16 ENCOUNTER — Encounter: Payer: Self-pay | Admitting: Physician Assistant

## 2020-11-18 ENCOUNTER — Other Ambulatory Visit: Payer: Self-pay | Admitting: Physician Assistant

## 2020-11-18 MED FILL — GLIPIZIDE ER 5 MG TB24: 5 | 90 days supply | Qty: 90 | Fill #0

## 2020-11-18 MED FILL — EZETIMIBE 10 MG TABS: 10 | 90 days supply | Qty: 90 | Fill #0

## 2020-12-06 ENCOUNTER — Other Ambulatory Visit: Payer: Self-pay | Admitting: Physician Assistant

## 2020-12-06 MED FILL — LISINOPRIL 40 MG TABS: 40 | 90 days supply | Qty: 90 | Fill #0

## 2020-12-13 ENCOUNTER — Other Ambulatory Visit: Payer: Self-pay | Admitting: Physician Assistant

## 2020-12-13 DIAGNOSIS — F430 Acute stress reaction: Secondary | ICD-10-CM

## 2020-12-13 DIAGNOSIS — F4323 Adjustment disorder with mixed anxiety and depressed mood: Secondary | ICD-10-CM

## 2020-12-13 MED FILL — JANUVIA 100 MG TABLET: 100 | 30 days supply | Qty: 30 | Fill #1

## 2020-12-13 MED FILL — METFORMIN HCL 1000 MG TABS: 1000 | 90 days supply | Qty: 180 | Fill #0

## 2020-12-13 MED FILL — FLUOXETINE HCL 20 MG TABS: 20 | 60 days supply | Qty: 60 | Fill #0

## 2020-12-29 ENCOUNTER — Encounter: Payer: Self-pay | Admitting: Physician Assistant

## 2020-12-29 ENCOUNTER — Other Ambulatory Visit: Payer: Self-pay | Admitting: Physician Assistant

## 2020-12-29 DIAGNOSIS — E1169 Type 2 diabetes mellitus with other specified complication: Secondary | ICD-10-CM

## 2020-12-29 MED ORDER — OZEMPIC (1 MG/DOSE) 4 MG/3ML ~~LOC~~ SOPN: 1.0000 mg | PEN_INJECTOR | SUBCUTANEOUS | 1 refills | Status: DC

## 2021-01-05 ENCOUNTER — Other Ambulatory Visit: Payer: Self-pay | Admitting: Physician Assistant

## 2021-01-05 MED FILL — LIOTHYRONINE SODIUM 5 MCG T: 5 | 60 days supply | Qty: 120 | Fill #0

## 2021-01-07 ENCOUNTER — Other Ambulatory Visit (HOSPITAL_BASED_OUTPATIENT_CLINIC_OR_DEPARTMENT_OTHER): Payer: Self-pay

## 2021-01-11 ENCOUNTER — Other Ambulatory Visit: Payer: Self-pay | Admitting: Physician Assistant

## 2021-01-11 DIAGNOSIS — E039 Hypothyroidism, unspecified: Secondary | ICD-10-CM

## 2021-01-11 DIAGNOSIS — I152 Hypertension secondary to endocrine disorders: Secondary | ICD-10-CM

## 2021-01-11 DIAGNOSIS — E1159 Type 2 diabetes mellitus with other circulatory complications: Secondary | ICD-10-CM

## 2021-01-11 MED ORDER — SYNTHROID 100 MCG PO TABS: 100.0000 ug | ORAL_TABLET | Freq: Every day | ORAL | 0 refills | Status: DC

## 2021-01-11 MED FILL — JANUVIA 100 MG TABLET: 100 | 30 days supply | Qty: 30 | Fill #2

## 2021-01-11 MED FILL — HYDROCHLOROTHIAZIDE 25 MG T: 25 | 90 days supply | Qty: 90 | Fill #0

## 2021-01-11 MED FILL — SYNTHROID 100 MCG TABLET: 100 | 60 days supply | Qty: 60 | Fill #0

## 2021-01-17 ENCOUNTER — Other Ambulatory Visit (HOSPITAL_COMMUNITY): Payer: Self-pay

## 2021-01-31 ENCOUNTER — Other Ambulatory Visit: Payer: Self-pay | Admitting: Physician Assistant

## 2021-01-31 DIAGNOSIS — E1169 Type 2 diabetes mellitus with other specified complication: Secondary | ICD-10-CM

## 2021-01-31 DIAGNOSIS — R7989 Other specified abnormal findings of blood chemistry: Secondary | ICD-10-CM

## 2021-01-31 DIAGNOSIS — E039 Hypothyroidism, unspecified: Secondary | ICD-10-CM

## 2021-01-31 DIAGNOSIS — I152 Hypertension secondary to endocrine disorders: Secondary | ICD-10-CM

## 2021-01-31 DIAGNOSIS — E559 Vitamin D deficiency, unspecified: Secondary | ICD-10-CM

## 2021-02-11 ENCOUNTER — Other Ambulatory Visit: Payer: Self-pay

## 2021-02-11 ENCOUNTER — Other Ambulatory Visit: Payer: No Typology Code available for payment source

## 2021-02-11 DIAGNOSIS — I152 Hypertension secondary to endocrine disorders: Secondary | ICD-10-CM

## 2021-02-11 DIAGNOSIS — E1159 Type 2 diabetes mellitus with other circulatory complications: Secondary | ICD-10-CM

## 2021-02-11 DIAGNOSIS — E785 Hyperlipidemia, unspecified: Secondary | ICD-10-CM

## 2021-02-11 DIAGNOSIS — E559 Vitamin D deficiency, unspecified: Secondary | ICD-10-CM

## 2021-02-11 DIAGNOSIS — E039 Hypothyroidism, unspecified: Secondary | ICD-10-CM

## 2021-02-11 DIAGNOSIS — R7989 Other specified abnormal findings of blood chemistry: Secondary | ICD-10-CM

## 2021-02-11 DIAGNOSIS — E1169 Type 2 diabetes mellitus with other specified complication: Secondary | ICD-10-CM

## 2021-02-12 LAB — HEMOGLOBIN A1C
Est. average glucose Bld gHb Est-mCnc: 214 mg/dL
Hgb A1c MFr Bld: 9.1 % — ABNORMAL HIGH (ref 4.8–5.6)

## 2021-02-12 LAB — COMPREHENSIVE METABOLIC PANEL
ALT: 33 IU/L — ABNORMAL HIGH (ref 0–32)
AST: 42 IU/L — ABNORMAL HIGH (ref 0–40)
Albumin/Globulin Ratio: 1.5 (ref 1.2–2.2)
Albumin: 4.3 g/dL (ref 3.8–4.8)
Alkaline Phosphatase: 77 IU/L (ref 44–121)
BUN/Creatinine Ratio: 15 (ref 12–28)
BUN: 10 mg/dL (ref 8–27)
Bilirubin Total: 0.3 mg/dL (ref 0.0–1.2)
CO2: 23 mmol/L (ref 20–29)
Calcium: 9.9 mg/dL (ref 8.7–10.3)
Chloride: 93 mmol/L — ABNORMAL LOW (ref 96–106)
Creatinine, Ser: 0.66 mg/dL (ref 0.57–1.00)
Globulin, Total: 2.9 g/dL (ref 1.5–4.5)
Glucose: 220 mg/dL — ABNORMAL HIGH (ref 65–99)
Potassium: 4.8 mmol/L (ref 3.5–5.2)
Sodium: 133 mmol/L — ABNORMAL LOW (ref 134–144)
Total Protein: 7.2 g/dL (ref 6.0–8.5)
eGFR: 97 mL/min/{1.73_m2} (ref >59)

## 2021-02-12 LAB — CBC
Hematocrit: 35.7 % (ref 34.0–46.6)
Hemoglobin: 10.4 g/dL — ABNORMAL LOW (ref 11.1–15.9)
MCH: 21.7 pg — ABNORMAL LOW (ref 26.6–33.0)
MCHC: 29.1 g/dL — ABNORMAL LOW (ref 31.5–35.7)
MCV: 74 fL — ABNORMAL LOW (ref 79–97)
Platelets: 505 10*3/uL — ABNORMAL HIGH (ref 150–450)
RBC: 4.8 x10E6/uL (ref 3.77–5.28)
RDW: 15.6 % — ABNORMAL HIGH (ref 11.7–15.4)
WBC: 8.3 10*3/uL (ref 3.4–10.8)

## 2021-02-12 LAB — LIPID PANEL
Chol/HDL Ratio: 3.9 ratio (ref 0.0–4.4)
Cholesterol, Total: 186 mg/dL (ref 100–199)
HDL: 48 mg/dL (ref >39)
LDL Chol Calc (NIH): 106 mg/dL — ABNORMAL HIGH (ref 0–99)
Triglycerides: 183 mg/dL — ABNORMAL HIGH (ref 0–149)
VLDL Cholesterol Cal: 32 mg/dL (ref 5–40)

## 2021-02-12 LAB — VITAMIN D 25 HYDROXY (VIT D DEFICIENCY, FRACTURES): Vit D, 25-Hydroxy: 41.1 ng/mL (ref 30.0–100.0)

## 2021-02-12 LAB — TSH: TSH: 12.2 u[IU]/mL — ABNORMAL HIGH (ref 0.450–4.500)

## 2021-02-14 ENCOUNTER — Telehealth: Payer: Self-pay | Admitting: Physician Assistant

## 2021-02-14 LAB — T4, FREE: Free T4: 1.19 ng/dL (ref 0.82–1.77)

## 2021-02-14 LAB — SPECIMEN STATUS REPORT

## 2021-02-14 LAB — T3: T3, Total: 133 ng/dL (ref 71–180)

## 2021-02-14 NOTE — Telephone Encounter (Signed)
-----   Message from Lorrene Reid, [place] sent at 02/14/2021  7:52 AM EDT ----- Please contact [facility] and add free T4 and T3.  Thank you, Herb Grays

## 2021-02-14 NOTE — Telephone Encounter (Signed)
Labs have been added. AS, CMA 

## 2021-02-15 ENCOUNTER — Other Ambulatory Visit: Payer: Self-pay

## 2021-02-15 ENCOUNTER — Other Ambulatory Visit (HOSPITAL_COMMUNITY): Payer: Self-pay

## 2021-02-15 ENCOUNTER — Ambulatory Visit (INDEPENDENT_AMBULATORY_CARE_PROVIDER_SITE_OTHER): Payer: No Typology Code available for payment source | Admitting: Physician Assistant

## 2021-02-15 ENCOUNTER — Encounter: Payer: Self-pay | Admitting: Physician Assistant

## 2021-02-15 VITALS — BP 127/76 | HR 85 | Temp 97.6°F | Ht 65.0 in | Wt 182.5 lb

## 2021-02-15 DIAGNOSIS — E785 Hyperlipidemia, unspecified: Secondary | ICD-10-CM

## 2021-02-15 DIAGNOSIS — E039 Hypothyroidism, unspecified: Secondary | ICD-10-CM

## 2021-02-15 DIAGNOSIS — E1169 Type 2 diabetes mellitus with other specified complication: Secondary | ICD-10-CM | POA: Diagnosis not present

## 2021-02-15 DIAGNOSIS — D649 Anemia, unspecified: Secondary | ICD-10-CM

## 2021-02-15 DIAGNOSIS — Z789 Other specified health status: Secondary | ICD-10-CM

## 2021-02-15 DIAGNOSIS — E1159 Type 2 diabetes mellitus with other circulatory complications: Secondary | ICD-10-CM

## 2021-02-15 DIAGNOSIS — F430 Acute stress reaction: Secondary | ICD-10-CM

## 2021-02-15 DIAGNOSIS — R748 Abnormal levels of other serum enzymes: Secondary | ICD-10-CM

## 2021-02-15 DIAGNOSIS — I152 Hypertension secondary to endocrine disorders: Secondary | ICD-10-CM

## 2021-02-15 DIAGNOSIS — F4323 Adjustment disorder with mixed anxiety and depressed mood: Secondary | ICD-10-CM

## 2021-02-15 MED ORDER — FREESTYLE LANCETS MISC
1 refills | Status: DC
  Filled 2021-02-15: qty 100, 25d supply, fill #0

## 2021-02-15 MED ORDER — GLIPIZIDE ER 5 MG PO TB24
ORAL_TABLET | Freq: Every day | ORAL | 1 refills | Status: DC
  Filled 2021-02-15: qty 90, 90d supply, fill #0
  Filled 2021-05-17: qty 90, 90d supply, fill #1

## 2021-02-15 MED ORDER — SITAGLIPTIN PHOSPHATE 100 MG PO TABS
ORAL_TABLET | Freq: Every day | ORAL | 1 refills | Status: DC
  Filled 2021-02-15: qty 90, 90d supply, fill #0
  Filled 2021-05-17: qty 30, 30d supply, fill #1
  Filled 2021-06-13: qty 30, 30d supply, fill #2
  Filled 2021-07-12: qty 30, 30d supply, fill #3

## 2021-02-15 MED ORDER — FLUOXETINE HCL 20 MG PO TABS
30.0000 mg | ORAL_TABLET | Freq: Every day | ORAL | 1 refills | Status: DC
  Filled 2021-02-15: qty 135, 90d supply, fill #0
  Filled 2021-05-17: qty 135, 90d supply, fill #1

## 2021-02-15 MED ORDER — FREESTYLE LITE TEST VI STRP
ORAL_STRIP | 1 refills | Status: DC
  Filled 2021-02-15: qty 100, 25d supply, fill #0

## 2021-02-15 MED ORDER — LIOTHYRONINE SODIUM 5 MCG PO TABS: ORAL_TABLET | ORAL | 0 refills | Status: DC

## 2021-02-15 NOTE — Patient Instructions (Signed)
Managing Stress, Adult Feeling a certain amount of stress is normal. Stress helps our body and mind get ready to deal with the demands of life. Stress hormones can motivate you to do well at work and meet your responsibilities. However severe or long-lasting (chronic) stress can affect your mental and physical health. Chronic stress puts you at higher risk for anxiety, depression, and other health problems like digestive problems, muscle aches, heart disease, high blood pressure, and stroke. What are the causes? Common causes of stress include:  Demands from work, such as deadlines, feeling overworked, or having long hours.  Pressures at home, such as money issues, disagreements with a spouse, or parenting issues.  Pressures from major life changes, such as divorce, moving, loss of a loved one, or chronic illness. You may be at higher risk for stress-related problems if you do not get enough sleep, are in poor health, do not have emotional support, or have a mental health disorder like anxiety or depression. How to recognize stress Stress can make you:  Have trouble sleeping.  Feel sad, anxious, irritable, or overwhelmed.  Lose your appetite.  Overeat or want to eat unhealthy foods.  Want to use drugs or alcohol. Stress can also cause physical symptoms, such as:  Sore, tense muscles, especially in the shoulders and neck.  Headaches.  Trouble breathing.  A faster heart rate.  Stomach pain, nausea, or vomiting.  Diarrhea or constipation.  Trouble concentrating. Follow these instructions at home: Lifestyle  Identify the source of your stress and your reaction to it. See a therapist who can help you change your reactions.  When there are stressful events: ? Talk about it with family, friends, or co-workers. ? Try to think realistically about stressful events and not ignore them or overreact. ? Try to find the positives in a stressful situation and not focus on the  negatives. ? Cut back on responsibilities at work and home, if possible. Ask for help from friends or family members if you need it.  Find ways to cope with stress, such as: ? Meditation. ? Deep breathing. ? Yoga or tai chi. ? Progressive muscle relaxation. ? Doing art, playing music, or reading. ? Making time for fun activities. ? Spending time with family and friends.  Get support from family, friends, or spiritual resources. Eating and drinking  Eat a healthy diet. This includes: ? Eating foods that are high in fiber, such as beans, whole grains, and fresh fruits and vegetables. ? Limiting foods that are high in fat and processed sugars, such as fried and sweet foods.  Do not skip meals or overeat.  Drink enough fluid to keep your urine pale yellow. Alcohol use  Do not drink alcohol if: ? Your health care provider tells you not to drink. ? You are pregnant, may be pregnant, or are planning to become pregnant.  Drinking alcohol is a way some people try to ease their stress. This can be dangerous, so if you drink alcohol: ? Limit how much you use to:  0-1 drink a day for women.  0-2 drinks a day for men. ? Be aware of how much alcohol is in your drink. In the [place] one drink equals one 12 oz bottle of beer (355 mL), one 5 oz glass of wine (148 mL), or one 1 oz glass of hard liquor (44 mL). Activity  Include 30 minutes of exercise in your daily schedule. Exercise is a good stress reducer.  Include time in your day for   an activity that you find relaxing. Try taking a walk, going on a bike ride, reading a book, or listening to music.  Schedule your time in a way that lowers stress, and keep a consistent schedule. Prioritize what is most important to get done.   General instructions  Get enough sleep. Try to go to sleep and get up at about the same time every day.  Take over-the-counter and prescription medicines only as told by your health care provider.  Do not use any  products that contain nicotine or tobacco, such as cigarettes, e-cigarettes, and chewing tobacco. If you need help quitting, ask your health care provider.  Do not use drugs or smoke to cope with stress.  Keep all follow-up visits as told by your health care provider. This is important. Where to find support  Talk with your health care provider about stress management or finding a support group.  Find a therapist to work with you on your stress management techniques. Contact a health care provider if:  Your stress symptoms get worse.  You are unable to manage your stress at home.  You are struggling to stop using drugs or alcohol. Get help right away if:  You may be a danger to yourself or others.  You have any thoughts of death or suicide. If you ever feel like you may hurt yourself or others, or have thoughts about taking your own life, get help right away. You can go to your nearest emergency department or call:  Your local emergency services (911 in the [place]  A suicide crisis helpline, such as the [place] at 705-512-5247. This is open 24 hours a day. Summary  Feeling a certain amount of stress is normal, but severe or long-lasting (chronic) stress can affect your mental and physical health.  Chronic stress can put you at higher risk for anxiety, depression, and other health problems like digestive problems, muscle aches, heart disease, high blood pressure, and stroke.  You may be at higher risk for stress-related problems if you do not get enough sleep, are in poor health, lack emotional support, or have a mental health disorder like anxiety or depression.  Identify the source of your stress and your reaction to it. Try talking about stressful events with family, friends, or co-workers, finding a coping method, or getting support from spiritual resources.  If you need more help, talk with your health care provider about finding a support group  or a mental health therapist. This information is not intended to replace advice given to you by your health care provider. Make sure you discuss any questions you have with your health care provider. Document Revised: 04/30/2019 Document Reviewed: 04/30/2019 Elsevier Patient Education  [place].

## 2021-02-15 NOTE — Progress Notes (Signed)
Established Patient Office Visit  Subjective:  Patient ID: Robin Greene, female    DOB: 12-12-1955  Age: 65 y.o. MRN: [id number]  CC:  Chief Complaint  Patient presents with  . Diabetes  . Hypertension  . Hyperlipidemia    HPI [name] presents for follow up on diabetes mellitus, hypertension, hyperlipidemia, mood management and to discuss most recent labs.   Diabetes mellitus: Pt denies increased urination. Does reports increased thirst but feels like it is attributed to not drinking as much water as before. Pt reports medication compliance. No hypoglycemic events. Checking glucose at home. States noticed her fasting sugars improving from 280 to 160 when Ozempic was increased to 1 mg about 6 weeks ago. Reports mild upset stomach with increased dose but overall tolerating medication. Patient reports is under significant personal stress and has not been taking care of self so has not been as active and diligent with diet.   HTN: Pt denies chest pain, palpitations, dizziness or lower extremity swelling. Taking medication as directed without side effects. Checks BP at home and readings range 130/70-80s. Pt follows a low salt diet. Reports has not been staying hydrated as should.  HLD: Pt unable to take statin therapy due to abnormal liver function and myalgias.   Mood: Patient reports increased personal stress, is helping take care of her mother who has complex co-morbidities including Parkinson's. Is currently enrolled with [facility] employee wellness program to improve stress levels. Patient reports would be interested in increasing Fluoxetine during this challenging time. Denies SI/HI.    Anemia: Patient reports has not been taking iron supplement because it upsets her stomach and is still getting adjusted to Ozempic. Has not been taking vitamin B12 supplement either.  Hypothyroidism: Patient reports has not noticed hair changes or other symptoms she has had before when thyroid has been  unstable. Reports medication compliance.     Past Medical History:  Diagnosis Date  . Anemia   . Arthritis   . Complication of anesthesia   . Diabetes mellitus    dx 2010  . GERD (gastroesophageal reflux disease)    occasional   will take OTC  . HSV-1 infection   . Hypertension   . PONV (postoperative nausea and vomiting)   . Vitamin D deficiency     Past Surgical History:  Procedure Laterality Date  . ABDOMINAL HYSTERECTOMY    . EYE SURGERY    . INSERTION OF MESH N/A 04/30/2017   Procedure: INSERTION OF MESH;  Surgeon: Jackolyn Confer, MD;  Location: [facility];  Service: General;  Laterality: N/A;  . OVARY SURGERY    . REFRACTIVE SURGERY    . UMBILICAL HERNIA REPAIR N/A 04/30/2017   Procedure: UMBILICAL HERNIA REPAIR WITH MESH;  Surgeon: Jackolyn Confer, MD;  Location: [facility] OR;  Service: General;  Laterality: N/A;    Family History  Problem Relation Age of Onset  . Sjogren's syndrome Mother   . Hypertension Father   . Hyperlipidemia Father   . Healthy Sister     Social History   Socioeconomic History  . Marital status: Married    Spouse name: Not on file  . Number of children: Not on file  . Years of education: Not on file  . Highest education level: Not on file  Occupational History  . Not on file  Tobacco Use  . Smoking status: Never Smoker  . Smokeless tobacco: Never Used  Substance and Sexual Activity  . Alcohol use: Yes    Comment:  occasionally  . Drug use: No  . Sexual activity: Yes    Birth control/protection: Surgical  Other Topics Concern  . Not on file  Social History Narrative  . Not on file   Social Determinants of Health   Financial Resource Strain: Not on file  Food Insecurity: Not on file  Transportation Needs: Not on file  Physical Activity: Not on file  Stress: Not on file  Social Connections: Not on file  Intimate Partner Violence: Not on file    Outpatient Medications Prior to Visit  Medication Sig Dispense Refill  . blood  glucose meter kit and supplies Dispense based on patient and insurance preference. Use three to four times weekly as directed. (FOR ICD-10 E10.9, E11.9). 1 each 0  . estradiol (ESTRACE) 0.1 MG/GM vaginal cream INSERT 1 GRAM VAGINALLY AT BEDTIME 42.5 g 6  . ezetimibe (ZETIA) 10 MG tablet TAKE 1 TABLET BY MOUTH ONCE DAILY 90 tablet 0  . hydrochlorothiazide (HYDRODIURIL) 25 MG tablet TAKE 1 TABLET BY MOUTH DAILY 90 tablet 0  . lisinopril (ZESTRIL) 40 MG tablet TAKE 1 TABLET BY MOUTH ONCE DAILY 90 tablet 0  . metFORMIN (GLUCOPHAGE) 1000 MG tablet TAKE 1 TABLET BY MOUTH 2 TIMES DAILY WITH A MEAL 180 tablet 0  . metroNIDAZOLE (METROGEL) 1 % gel Apply 1 application topically daily as needed (rosacea).    . Semaglutide, 1 MG/DOSE, 4 MG/3ML SOPN INJECT 1 MG INTO THE SKIN ONCE A WEEK. 6 mL 1  . SYNTHROID 100 MCG tablet TAKE 1 TABLET BY MOUTH DAILY BEFORE BREAKFAST 90 tablet 0  . terconazole (TERAZOL 7) 0.4 % vaginal cream PLACE 1 APPLICATOR VAGINALLY AT BEDTIME. 45 g 1  . valACYclovir (VALTREX) 1000 MG tablet Take 500 mg by mouth 2 (two) times daily as needed (fever blisters). 1/2 tablet twice daily for 5 days as needed for flares    . ALPRAZolam (XANAX) 0.25 MG tablet Take 1 tablet (0.25 mg total) by mouth 3 (three) times daily as needed for anxiety. 30 tablet 0  . FLUoxetine (PROZAC) 20 MG tablet TAKE 1 TABLET BY MOUTH ONCE DAILY (NEED APT FOR REFILLS) 60 tablet 0  . FREESTYLE LITE test strip USE TO CHECK BLOOD SUGAR 3 TO 4 TIMES A WEEK AS DIRECTED 100 strip 0  . glipiZIDE (GLUCOTROL XL) 5 MG 24 hr tablet TAKE 1 TABLET BY MOUTH ONCE A DAY WITH BREAKFAST 90 tablet 0  . Lancets (FREESTYLE) lancets USE TO CHECK BLOOD SUGAR 3 TO 4 TIMES A WEEK AS DIRECTED 100 each 0  . liothyronine (CYTOMEL) 5 MCG tablet TAKE 1 TABLET BY MOUTH 2 TIMES DAILY 120 tablet 0  . sitaGLIPtin (JANUVIA) 100 MG tablet TAKE 1 TABLET BY MOUTH DAILY. 90 tablet 1  . azithromycin (ZITHROMAX) 250 MG tablet TAKE 2 TABLETS BY MOUTH ON DAY 1  THEN TAKE 1 TABLET DAILY FOR THE NEXT 4 DAYS (Patient not taking: Reported on 11/15/2020) 6 tablet 0  . benzonatate (TESSALON) 100 MG capsule TAKE 1 CAPSULE BY MOUTH 3 TIMES DAILY AS NEEDED FOR COUGH (Patient not taking: Reported on 11/15/2020) 20 capsule 0  . cyanocobalamin 2000 MCG tablet Take 2,000 mcg by mouth daily.    Marland Kitchen Fexofenadine HCl (ALLEGRA ALLERGY PO) Take by mouth.    . fluconazole (DIFLUCAN) 150 MG tablet TAKE 1 TABLET BY MOUTH AS DIRECTED 1 tablet 3  . FOLIC ACID PO Take 1 tablet by mouth daily. (Patient not taking: Reported on 11/15/2020)     No facility-administered medications prior  to visit.    Allergies  Allergen Reactions  . Asa [Aspirin] Anaphylaxis  . Crestor [Rosuvastatin Calcium] Other (See Comments)    Abnormal LFTs  . Claritin [Loratadine] Other (See Comments)    Fainted twice   . Invokana [Canagliflozin] Other (See Comments)    Genital yeast infections  . Jardiance [Empagliflozin] Other (See Comments)    Genital yeast infection  . Lipitor [Atorvastatin] Other (See Comments)    Muscle aches   . Simvastatin Other (See Comments)    Muscle aches    ROS Review of Systems A fourteen system review of systems was performed and found to be positive as per HPI.   Objective:    Physical Exam General:  Pleasant and cooperative, in no acute distress  Neuro:  Alert and oriented,  extra-ocular muscles intact  HEENT:  Normocephalic, atraumatic, neck supple Skin:  no gross rash, warm, pink. Cardiac:  RRR, S1 S2 Respiratory:  ECTA B/L w/o wheezing, Not using accessory muscles, speaking in full sentences- unlabored. Vascular:  Ext warm, no cyanosis apprec.; cap RF less 2 sec. Psych:  No HI/SI, judgement and insight good, Euthymic mood. Full Affect.   BP 127/76   Pulse 85   Temp 97.6 F (36.4 C)   Ht '5\' 5"'  (1.651 m)   Wt 182 lb 8 oz (82.8 kg)   LMP  (LMP Unknown)   SpO2 99%   BMI 30.37 kg/m  Wt Readings from Last 3 Encounters:  02/15/21 182 lb 8 oz (82.8  kg)  11/15/20 181 lb 11.2 oz (82.4 kg)  06/24/20 179 lb 12.8 oz (81.6 kg)     Health Maintenance Due  Topic Date Due  . COVID-19 Vaccine (1) Never done  . OPHTHALMOLOGY EXAM  Never done  . DEXA SCAN  Never done  . PNA vac Low Risk Adult (2 of 2 - PPSV23) 04/24/2019  . COLONOSCOPY (Pts 45-22yr Insurance coverage will need to be confirmed)  12/29/2019    There are no preventive care reminders to display for this patient.  Lab Results  Component Value Date   TSH 12.200 (H) 02/11/2021   Lab Results  Component Value Date   WBC 8.3 02/11/2021   HGB 10.4 (L) 02/11/2021   HCT 35.7 02/11/2021   MCV 74 (L) 02/11/2021   PLT 505 (H) 02/11/2021   Lab Results  Component Value Date   NA 133 (L) 02/11/2021   K 4.8 02/11/2021   CO2 23 02/11/2021   GLUCOSE 220 (H) 02/11/2021   BUN 10 02/11/2021   CREATININE 0.66 02/11/2021   BILITOT 0.3 02/11/2021   ALKPHOS 77 02/11/2021   AST 42 (H) 02/11/2021   ALT 33 (H) 02/11/2021   PROT 7.2 02/11/2021   ALBUMIN 4.3 02/11/2021   CALCIUM 9.9 02/11/2021   ANIONGAP 9 04/19/2017   EGFR 97 02/11/2021   Lab Results  Component Value Date   CHOL 186 02/11/2021   Lab Results  Component Value Date   HDL 48 02/11/2021   Lab Results  Component Value Date   LDLCALC 106 (H) 02/11/2021   Lab Results  Component Value Date   TRIG 183 (H) 02/11/2021   Lab Results  Component Value Date   CHOLHDL 3.9 02/11/2021   Lab Results  Component Value Date   HGBA1C 9.1 (H) 02/11/2021      Assessment & Plan:   Problem List Items Addressed This Visit      Cardiovascular and Mediastinum   Hypertension associated with diabetes ([place]-  with superimposed  white coat HTN (Chronic)   Relevant Medications   glipiZIDE (GLUCOTROL XL) 5 MG 24 hr tablet   sitaGLIPtin (JANUVIA) 100 MG tablet     Endocrine   Hypothyroidism (Chronic)   Relevant Medications   liothyronine (CYTOMEL) 5 MCG tablet   Hyperlipidemia associated with type 2 diabetes mellitus (HCC)  (Chronic)   Relevant Medications   glipiZIDE (GLUCOTROL XL) 5 MG 24 hr tablet   sitaGLIPtin (JANUVIA) 100 MG tablet   Type 2 diabetes mellitus with other specified complication (HCC) - Primary   Relevant Medications   glucose blood (FREESTYLE LITE) test strip   glipiZIDE (GLUCOTROL XL) 5 MG 24 hr tablet   Lancets (FREESTYLE) lancets   sitaGLIPtin (JANUVIA) 100 MG tablet     Other   Anemia (Chronic)   acute reaction to extreme stress   Relevant Medications   FLUoxetine (PROZAC) 20 MG tablet   Adjustment disorder with mixed anxiety and depressed mood   Relevant Medications   FLUoxetine (PROZAC) 20 MG tablet     Type 2 diabetes mellitus with other specified complication: -Recent P5F has increased from 8.5 to 9.1. Increased A1c likely multifactorial from significant stress and poor adherence with diabetic diet. Patient wants to give Ozempic 1 mg more time before adjusting medications. Will continue current medication regimen. Provided refills.  Advised patient if fasting blood sugars fail to improve or worsen then recommend treatment adjustments by increasing Ozempic 2 mg or glipizide to 5 mg BID. Patient verbalized understanding and will notify me via [facility]. -Encouraged to continue employee wellness program to improve stress levels and self-care. -Recommend to resume physical activity and low carbohydrate and glucose diet. -Will continue to monitor.  Hypertension associated with diabetes-with superimposed whitecoat HTN: -BP recheck improved and at goal. -Continue current medication regimen. -Recent CMP: Cr 0.66, eGFR 97, sodium 133 (mildly low, likely from diuretic therapy), potassium 4.8 -Recommend to increase water hydration, continue low sodium diet and increase physical activity. -Will continue to monitor.  Hyperlipidemia associated with type 2 diabetes mellitus, Statin intolerance:  -Recent lipid panel: total cholesterol 186, triglycerides 183, HDL 48, LDL 106 (improved from  117) -Follow a heart healthy diet and reduce simple carbohydrates. Increase physical activity. -Will continue to monitor.  Hypothyroidism: -Recent TSH 12.200, free T4 1.03, T3 133 -Discussed with patient TSH has gradually increased and given it is >10.0 recommend treatment adjustments by increasing Cytomel to 15 mg/day vs 10 mg/day. Free T4 and T3 are within normal range so will be cautious with dose adjustments.  -Will repeat thyroid labs in 8 weeks.   Anemia: -Recent CBC: hemoglobin 10.4, hematocrit 35.7, MCV 74 (mildly low), MCH 21.7 (mildly low). Disucssed with patient microcytic anemia likely iron deficiency and recommend to restart iron supplement 325 mg once daily and take with orange juice for better absorption. Can trial liquid form for less GI side effects. -Will repeat CBC in 8 weeks.  Adjustment disorder with mixed anxiety and depressed mood, Acute reaction to extreme stress: -Discussed with patient increasing fluoxetine to 30 mg to help improve stress and mood during this challenging time. Patient verbalized understanding and agreeable. Advised to let me know if unable to tolerate increased dose. -Continue employee wellness program.  -Will continue to monitor.  Elevated liver enzymes: -Recent AST 42 ALT 33, will continue to monitor. Hep C screening 07/26/2017 negative. -Reviewed abdomen US 12/07/2006 which revealed increased echogenicity of the liver compatible with diffuse hepatocellular diease, likely fatty infiltration. -Encourage to continue weight loss efforts.  Meds ordered  this encounter  Medications  . FLUoxetine (PROZAC) 20 MG tablet    Sig: Take 1 and 1/2 tablets (30 mg total) by mouth daily.    Dispense:  135 tablet    Refill:  1    Order Specific Question:   Supervising Provider    Answer:   Beatrice Lecher D [[id number]]  . glucose blood (FREESTYLE LITE) test strip    Sig: USE TO CHECK BLOOD SUGAR 3 TO 4 TIMES A WEEK AS DIRECTED    Dispense:  100 strip     Refill:  1    Order Specific Question:   Supervising Provider    Answer:   Beatrice Lecher D [[id number]]  . glipiZIDE (GLUCOTROL XL) 5 MG 24 hr tablet    Sig: TAKE 1 TABLET BY MOUTH ONCE A DAY WITH BREAKFAST    Dispense:  90 tablet    Refill:  1    Order Specific Question:   Supervising Provider    Answer:   Beatrice Lecher D [[id number]]  . Lancets (FREESTYLE) lancets    Sig: USE TO CHECK BLOOD SUGAR 3 TO 4 TIMES A WEEK AS DIRECTED    Dispense:  100 each    Refill:  1    Order Specific Question:   Supervising Provider    Answer:   Beatrice Lecher D [[id number]]  . liothyronine (CYTOMEL) 5 MCG tablet    Sig: Take 2 tablets in the morning and 1 tablet in the afternoon.    Dispense:  120 tablet    Refill:  0    Order Specific Question:   Supervising Provider    Answer:   Beatrice Lecher D [[id number]]  . sitaGLIPtin (JANUVIA) 100 MG tablet    Sig: TAKE 1 TABLET BY MOUTH DAILY.    Dispense:  90 tablet    Refill:  1    Order Specific Question:   Supervising Provider    Answer:   Beatrice Lecher D [[id number]]    Follow-up: Return in about 3 months (around 05/18/2021) for DM, HTN, thyroid and lab visit in 8 weeks for thyroid panel, cbc, cmp.    Lorrene Reid, PA-C

## 2021-02-28 ENCOUNTER — Other Ambulatory Visit (HOSPITAL_COMMUNITY): Payer: Self-pay

## 2021-02-28 ENCOUNTER — Other Ambulatory Visit: Payer: Self-pay | Admitting: Physician Assistant

## 2021-02-28 DIAGNOSIS — E039 Hypothyroidism, unspecified: Secondary | ICD-10-CM

## 2021-02-28 MED ORDER — LIOTHYRONINE SODIUM 5 MCG PO TABS: ORAL_TABLET | ORAL | 0 refills | Status: DC

## 2021-03-16 ENCOUNTER — Other Ambulatory Visit (HOSPITAL_COMMUNITY): Payer: Self-pay

## 2021-03-16 ENCOUNTER — Other Ambulatory Visit: Payer: Self-pay | Admitting: Physician Assistant

## 2021-03-16 MED ORDER — LISINOPRIL 40 MG PO TABS
ORAL_TABLET | Freq: Every day | ORAL | 0 refills | Status: DC
  Filled 2021-03-16: qty 90, 90d supply, fill #0

## 2021-03-16 MED ORDER — METFORMIN HCL 1000 MG PO TABS
ORAL_TABLET | Freq: Two times a day (BID) | ORAL | 0 refills | Status: DC
  Filled 2021-03-16: qty 180, 90d supply, fill #0

## 2021-03-16 MED ORDER — EZETIMIBE 10 MG PO TABS
ORAL_TABLET | Freq: Every day | ORAL | 0 refills | Status: DC
  Filled 2021-03-16: qty 90, 90d supply, fill #0

## 2021-03-16 MED FILL — Levothyroxine Sodium Tab 100 MCG: ORAL | 90 days supply | Qty: 90 | Fill #0 | Status: AC

## 2021-03-18 ENCOUNTER — Other Ambulatory Visit (HOSPITAL_COMMUNITY): Payer: Self-pay

## 2021-03-21 ENCOUNTER — Other Ambulatory Visit (HOSPITAL_COMMUNITY): Payer: Self-pay

## 2021-03-21 MED FILL — Semaglutide Soln Pen-inj 1 MG/DOSE (4 MG/3ML): SUBCUTANEOUS | 84 days supply | Qty: 9 | Fill #0 | Status: AC

## 2021-03-23 ENCOUNTER — Other Ambulatory Visit (HOSPITAL_COMMUNITY): Payer: Self-pay

## 2021-03-23 ENCOUNTER — Encounter: Payer: Self-pay | Admitting: Physician Assistant

## 2021-04-11 ENCOUNTER — Other Ambulatory Visit (HOSPITAL_COMMUNITY): Payer: Self-pay

## 2021-04-11 ENCOUNTER — Other Ambulatory Visit: Payer: Self-pay | Admitting: Physician Assistant

## 2021-04-11 DIAGNOSIS — E1159 Type 2 diabetes mellitus with other circulatory complications: Secondary | ICD-10-CM

## 2021-04-11 DIAGNOSIS — I152 Hypertension secondary to endocrine disorders: Secondary | ICD-10-CM

## 2021-04-12 ENCOUNTER — Other Ambulatory Visit (HOSPITAL_COMMUNITY): Payer: Self-pay

## 2021-04-12 MED ORDER — HYDROCHLOROTHIAZIDE 25 MG PO TABS
ORAL_TABLET | Freq: Every day | ORAL | 0 refills | Status: DC
  Filled 2021-04-12: qty 90, 90d supply, fill #0

## 2021-04-13 ENCOUNTER — Other Ambulatory Visit: Payer: No Typology Code available for payment source

## 2021-04-14 ENCOUNTER — Other Ambulatory Visit: Payer: Self-pay

## 2021-04-14 ENCOUNTER — Other Ambulatory Visit: Payer: No Typology Code available for payment source

## 2021-04-14 DIAGNOSIS — E039 Hypothyroidism, unspecified: Secondary | ICD-10-CM

## 2021-04-14 DIAGNOSIS — E785 Hyperlipidemia, unspecified: Secondary | ICD-10-CM

## 2021-04-14 DIAGNOSIS — E1169 Type 2 diabetes mellitus with other specified complication: Secondary | ICD-10-CM

## 2021-04-15 LAB — CBC WITH DIFFERENTIAL/PLATELET
Basophils Absolute: 0.1 10*3/uL (ref 0.0–0.2)
Basos: 1 %
EOS (ABSOLUTE): 0.5 10*3/uL — ABNORMAL HIGH (ref 0.0–0.4)
Eos: 7 %
Hematocrit: 33 % — ABNORMAL LOW (ref 34.0–46.6)
Hemoglobin: 9.7 g/dL — ABNORMAL LOW (ref 11.1–15.9)
Immature Grans (Abs): 0 10*3/uL (ref 0.0–0.1)
Immature Granulocytes: 0 %
Lymphocytes Absolute: 1.3 10*3/uL (ref 0.7–3.1)
Lymphs: 18 %
MCH: 21.6 pg — ABNORMAL LOW (ref 26.6–33.0)
MCHC: 29.4 g/dL — ABNORMAL LOW (ref 31.5–35.7)
MCV: 74 fL — ABNORMAL LOW (ref 79–97)
Monocytes Absolute: 0.6 10*3/uL (ref 0.1–0.9)
Monocytes: 8 %
Neutrophils Absolute: 4.8 10*3/uL (ref 1.4–7.0)
Neutrophils: 66 %
Platelets: 424 10*3/uL (ref 150–450)
RBC: 4.49 x10E6/uL (ref 3.77–5.28)
RDW: 16.2 % — ABNORMAL HIGH (ref 11.7–15.4)
WBC: 7.2 10*3/uL (ref 3.4–10.8)

## 2021-04-15 LAB — COMPREHENSIVE METABOLIC PANEL
ALT: 44 IU/L — ABNORMAL HIGH (ref 0–32)
AST: 52 IU/L — ABNORMAL HIGH (ref 0–40)
Albumin/Globulin Ratio: 1.9 (ref 1.2–2.2)
Albumin: 4.4 g/dL (ref 3.8–4.8)
Alkaline Phosphatase: 77 IU/L (ref 44–121)
BUN/Creatinine Ratio: 16 (ref 12–28)
BUN: 14 mg/dL (ref 8–27)
Bilirubin Total: 0.3 mg/dL (ref 0.0–1.2)
CO2: 21 mmol/L (ref 20–29)
Calcium: 10 mg/dL (ref 8.7–10.3)
Chloride: 96 mmol/L (ref 96–106)
Creatinine, Ser: 0.85 mg/dL (ref 0.57–1.00)
Globulin, Total: 2.3 g/dL (ref 1.5–4.5)
Glucose: 242 mg/dL — ABNORMAL HIGH (ref 65–99)
Potassium: 4.9 mmol/L (ref 3.5–5.2)
Sodium: 134 mmol/L (ref 134–144)
Total Protein: 6.7 g/dL (ref 6.0–8.5)
eGFR: 76 mL/min/{1.73_m2} (ref >59)

## 2021-04-15 LAB — TSH: TSH: 5.66 u[IU]/mL — ABNORMAL HIGH (ref 0.450–4.500)

## 2021-04-20 ENCOUNTER — Encounter: Payer: Self-pay | Admitting: Physician Assistant

## 2021-04-20 DIAGNOSIS — D649 Anemia, unspecified: Secondary | ICD-10-CM

## 2021-04-21 ENCOUNTER — Telehealth: Payer: Self-pay | Admitting: Hematology

## 2021-04-21 NOTE — Telephone Encounter (Signed)
Received a new hem referral from Robin Reid, PA for anemia. Robin Greene has been cld and scheduled to see Dr. Irene Limbo  (per pt's request) on 7/21 at 11:20am. Pt aware to arrive 20 minutes early.

## 2021-04-22 LAB — HM DIABETES EYE EXAM

## 2021-04-25 ENCOUNTER — Other Ambulatory Visit (HOSPITAL_COMMUNITY): Payer: Self-pay

## 2021-05-04 NOTE — Progress Notes (Signed)
HEMATOLOGY/ONCOLOGY CONSULTATION NOTE  Date of Service: 05/04/2021  Patient Care Team: Lorrene Reid, PA-C as PCP - Windle Guard, MD as Consulting Physician (Gastroenterology) Delrae Rend, MD as Consulting Physician (Endocrinology) Druscilla Brownie, MD as Consulting Physician (Dermatology) Arvella Nigh, MD as Consulting Physician (Obstetrics and Gynecology) Vevelyn Royals, MD as Consulting Physician (Ophthalmology)  CHIEF COMPLAINTS/PURPOSE OF CONSULTATION:  Anemia  HISTORY OF PRESENTING ILLNESS:   Robin Greene is a wonderful 65 y.o. female who has been referred to [place] by Lorrene Reid, PA-C for evaluation and management of anemia. The pt reports that she is doing well overall.  The pt reports that she has developed anemia and fatigue over the last 6 months or so. She notes she previously several years ago had iron deficiency in the context of heme positive stools and had a GI w/u in 2011 by Dr Cristina Gong. Colonoscopy - no source of bleeding noted. EGD with large hiatal hernia with ulcers -- possible source of GI bleeding.  Lab results 04/14/2021 of CBC w/diff and CMP is as follows: all values are WNL except for Hgb of 9.7, HCT of 33.0, MCV of 74, MCH of 21.6, MCHC of 29.4, RDW of 16.2, Eosinophils Abs of 0.5K, Glucose of 242, AST of 52, ALT of 44. 04/14/2021 TSH of 5.66.  On review of systems, pt reports no overt GI or other bleeding and denies abdominal pain , weight loss , fever/chills or night sweats and any other symptoms.   MEDICAL HISTORY:  Past Medical History:  Diagnosis Date   Anemia    Arthritis    Complication of anesthesia    Diabetes mellitus    dx 2010   GERD (gastroesophageal reflux disease)    occasional   will take OTC   HSV-1 infection    Hypertension    PONV (postoperative nausea and vomiting)    Vitamin D deficiency     SURGICAL HISTORY: Past Surgical History:  Procedure Laterality Date   ABDOMINAL HYSTERECTOMY     EYE  SURGERY     INSERTION OF MESH N/A 04/30/2017   Procedure: INSERTION OF MESH;  Surgeon: Jackolyn Confer, MD;  Location: [place];  Service: General;  Laterality: N/A;   OVARY SURGERY     REFRACTIVE SURGERY     UMBILICAL HERNIA REPAIR N/A 04/30/2017   Procedure: UMBILICAL HERNIA REPAIR WITH MESH;  Surgeon: Jackolyn Confer, MD;  Location: [place];  Service: General;  Laterality: N/A;    SOCIAL HISTORY: Social History   Socioeconomic History   Marital status: Married    Spouse name: Not on file   Number of children: Not on file   Years of education: Not on file   Highest education level: Not on file  Occupational History   Not on file  Tobacco Use   Smoking status: Never   Smokeless tobacco: Never  Substance and Sexual Activity   Alcohol use: Yes    Comment: occasionally   Drug use: No   Sexual activity: Yes    Birth control/protection: Surgical  Other Topics Concern   Not on file  Social History Narrative   Not on file   Social Determinants of Health   Financial Resource Strain: Not on file  Food Insecurity: Not on file  Transportation Needs: Not on file  Physical Activity: Not on file  Stress: Not on file  Social Connections: Not on file  Intimate Partner Violence: Not on file    FAMILY HISTORY: Family History  Problem Relation Age of  Onset   Sjogren's syndrome Mother    Hypertension Father    Hyperlipidemia Father    Healthy Sister     ALLERGIES:  is allergic to asa [aspirin], crestor [rosuvastatin calcium], claritin [loratadine], invokana [canagliflozin], jardiance [empagliflozin], lipitor [atorvastatin], and simvastatin.  MEDICATIONS:  Current Outpatient Medications  Medication Sig Dispense Refill   blood glucose meter kit and supplies Dispense based on patient and insurance preference. Use three to four times weekly as directed. (FOR ICD-10 E10.9, E11.9). 1 each 0   ezetimibe (ZETIA) 10 MG tablet TAKE 1 TABLET BY MOUTH ONCE DAILY 90 tablet 0   FLUoxetine  (PROZAC) 20 MG tablet Take 1 and 1/2 tablets (30 mg total) by mouth daily. 135 tablet 1   glipiZIDE (GLUCOTROL XL) 5 MG 24 hr tablet TAKE 1 TABLET BY MOUTH ONCE A DAY WITH BREAKFAST 90 tablet 1   glucose blood (FREESTYLE LITE) test strip USE TO CHECK BLOOD SUGAR 3 TO 4 TIMES A WEEK AS DIRECTED 100 strip 1   hydrochlorothiazide (HYDRODIURIL) 25 MG tablet Take 1 tablet by mouth daily. 90 tablet 0   Lancets (FREESTYLE) lancets USE TO CHECK BLOOD SUGAR 3 TO 4 TIMES A WEEK AS DIRECTED 100 each 1   liothyronine (CYTOMEL) 5 MCG tablet Take 2 tablets in the morning and 1 tablet in the afternoon. 270 tablet 0   lisinopril (ZESTRIL) 40 MG tablet TAKE 1 TABLET BY MOUTH ONCE DAILY 90 tablet 0   metFORMIN (GLUCOPHAGE) 1000 MG tablet TAKE 1 TABLET BY MOUTH 2 TIMES DAILY WITH A MEAL 180 tablet 0   metroNIDAZOLE (METROGEL) 1 % gel Apply 1 application topically daily as needed (rosacea).     Semaglutide, 1 MG/DOSE, 4 MG/3ML SOPN INJECT 1 MG INTO THE SKIN ONCE A WEEK. 6 mL 1   sitaGLIPtin (JANUVIA) 100 MG tablet TAKE 1 TABLET BY MOUTH DAILY. 90 tablet 1   SYNTHROID 100 MCG tablet TAKE 1 TABLET BY MOUTH DAILY BEFORE BREAKFAST 90 tablet 0   terconazole (TERAZOL 7) 0.4 % vaginal cream PLACE 1 APPLICATOR VAGINALLY AT BEDTIME. 45 g 1   valACYclovir (VALTREX) 1000 MG tablet Take 500 mg by mouth 2 (two) times daily as needed (fever blisters). 1/2 tablet twice daily for 5 days as needed for flares     No current facility-administered medications for this visit.    REVIEW OF SYSTEMS:    10 Point review of Systems was done is negative except as noted above.  PHYSICAL EXAMINATION: ECOG PERFORMANCE STATUS: 1 - Symptomatic but completely ambulatory  .There were no vitals filed for this visit. There were no vitals filed for this visit. .There is no height or weight on file to calculate BMI.  NAD GENERAL:alert, in no acute distress and comfortable SKIN: no acute rashes, no significant lesions EYES: conjunctiva are  pink and non-injected, sclera anicteric OROPHARYNX: MMM, no exudates, no oropharyngeal erythema or ulceration NECK: supple, no JVD LYMPH:  no palpable lymphadenopathy in the cervical, axillary or inguinal regions LUNGS: clear to auscultation b/l with normal respiratory effort HEART: regular rate & rhythm ABDOMEN:  normoactive bowel sounds , non tender, not distended. Extremity: no pedal edema PSYCH: alert & oriented x 3 with fluent speech NEURO: no focal motor/sensory deficits  LABORATORY DATA:  I have reviewed the data as listed  . CBC Latest Ref Rng & Units 05/05/2021 05/05/2021 04/14/2021  WBC 4.0 - 10.5 K/uL 9.4 - 7.2  Hemoglobin 12.0 - 15.0 g/dL 10.0(L) - 9.7(L)  Hematocrit 34.0 - 46.6 % 32.8(L)  33.8(L) 33.0(L)  Platelets 150 - 400 K/uL 416(H) - 424    . CMP Latest Ref Rng & Units 05/05/2021 04/14/2021 02/11/2021  Glucose 70 - 99 mg/dL 150(H) 242(H) 220(H)  BUN 8 - 23 mg/dL '13 14 10  ' Creatinine 0.44 - 1.00 mg/dL 0.87 0.85 0.66  Sodium 135 - 145 mmol/L 135 134 133(L)  Potassium 3.5 - 5.1 mmol/L 4.3 4.9 4.8  Chloride 98 - 111 mmol/L 97(L) 96 93(L)  CO2 22 - 32 mmol/L '27 21 23  ' Calcium 8.9 - 10.3 mg/dL 10.1 10.0 9.9  Total Protein 6.5 - 8.1 g/dL 7.7 6.7 7.2  Total Bilirubin 0.3 - 1.2 mg/dL 0.5 0.3 0.3  Alkaline Phos 38 - 126 U/L 66 77 77  AST 15 - 41 U/L 39 52(H) 42(H)  ALT 0 - 44 U/L 35 44(H) 33(H)   . Lab Results  Component Value Date   IRON 28 05/05/2021   TIBC 476 (H) 05/05/2021   IRONPCTSAT 6 (L) 05/05/2021   (Iron and TIBC)  Lab Results  Component Value Date   FERRITIN 9 (L) 05/05/2021   B12 -- 468   RADIOGRAPHIC STUDIES: I have personally reviewed the radiological images as listed and agreed with the findings in the report. No results found.  ASSESSMENT & PLAN:   65 yo with   1) Microcytic ANemia due to severe iron deficiency 2) Iron deficiency likely related to GI losses -- possibly related to large hiatal hernia with blood loss  PLAN: -labs today  - done -f/u with PCP for stool occult blood testing -no evidence of pernicious anemia with neg parietal cell and IF antibodies. -recommended patient call and f/u with her gastroenterologist for GI workup Labs today IV Injectafer weekly x 2 doses RTC with Dr Irene Limbo with labs in 2 months   . Orders Placed This Encounter  Procedures   CBC with Differential ([place] Only)    Standing Status:   Future    Number of Occurrences:   1    Standing Expiration Date:   05/05/2022   CMP ([place] only)    Standing Status:   Future    Number of Occurrences:   1    Standing Expiration Date:   05/05/2022   Ferritin    Standing Status:   Future    Number of Occurrences:   1    Standing Expiration Date:   05/05/2022   Iron and TIBC    Standing Status:   Future    Number of Occurrences:   1    Standing Expiration Date:   05/05/2022   Vitamin B12    Standing Status:   Future    Number of Occurrences:   1    Standing Expiration Date:   05/05/2022   Anti-parietal antibody    Standing Status:   Future    Number of Occurrences:   1    Standing Expiration Date:   05/05/2022   Intrinsic factor antibodies    Standing Status:   Future    Number of Occurrences:   1    Standing Expiration Date:   05/05/2022   Folate RBC    Standing Status:   Future    Number of Occurrences:   1    Standing Expiration Date:   05/05/2022    FOLLOW UP: Labs today IV Injectafer weekly x 2 doses RTC with Dr Irene Limbo with labs in 2 months   All of the patients questions were answered with apparent satisfaction. The  patient knows to call the clinic with any problems, questions or concerns.  I spent 40 minutes counseling the patient face to face. The total time spent in the appointment was 60 minutes and more than 50% was on counseling and direct patient cares.    Sullivan Lone MD [place] AAHIVMS [facility] Hematology/Oncology Physician [facility]  (Office):       [phone] (Work cell):   [phone] (Fax):           [phone]  05/04/2021 6:01 PM  I, Reinaldo Raddle, am acting as scribe for Dr. Sullivan Lone, MD.   .I have reviewed the above documentation for accuracy and completeness, and I agree with the above. Brunetta Genera MD

## 2021-05-05 ENCOUNTER — Inpatient Hospital Stay: Payer: No Typology Code available for payment source | Attending: Hematology | Admitting: Hematology

## 2021-05-05 ENCOUNTER — Other Ambulatory Visit: Payer: Self-pay

## 2021-05-05 ENCOUNTER — Encounter: Payer: Self-pay | Admitting: Hematology

## 2021-05-05 ENCOUNTER — Inpatient Hospital Stay: Payer: No Typology Code available for payment source

## 2021-05-05 VITALS — BP 150/79 | HR 79 | Temp 97.9°F | Resp 18 | Ht 65.0 in | Wt 176.8 lb

## 2021-05-05 DIAGNOSIS — K449 Diaphragmatic hernia without obstruction or gangrene: Secondary | ICD-10-CM | POA: Insufficient documentation

## 2021-05-05 DIAGNOSIS — D500 Iron deficiency anemia secondary to blood loss (chronic): Secondary | ICD-10-CM

## 2021-05-05 DIAGNOSIS — Z79899 Other long term (current) drug therapy: Secondary | ICD-10-CM | POA: Diagnosis not present

## 2021-05-05 DIAGNOSIS — D509 Iron deficiency anemia, unspecified: Secondary | ICD-10-CM | POA: Diagnosis present

## 2021-05-05 LAB — CMP (CANCER CENTER ONLY)
ALT: 35 U/L (ref 0–44)
AST: 39 U/L (ref 15–41)
Albumin: 4.2 g/dL (ref 3.5–5.0)
Alkaline Phosphatase: 66 U/L (ref 38–126)
Anion gap: 11 (ref 5–15)
BUN: 13 mg/dL (ref 8–23)
CO2: 27 mmol/L (ref 22–32)
Calcium: 10.1 mg/dL (ref 8.9–10.3)
Chloride: 97 mmol/L — ABNORMAL LOW (ref 98–111)
Creatinine: 0.87 mg/dL (ref 0.44–1.00)
GFR, Estimated: >60 mL/min (ref >60)
Glucose, Bld: 150 mg/dL — ABNORMAL HIGH (ref 70–99)
Potassium: 4.3 mmol/L (ref 3.5–5.1)
Sodium: 135 mmol/L (ref 135–145)
Total Bilirubin: 0.5 mg/dL (ref 0.3–1.2)
Total Protein: 7.7 g/dL (ref 6.5–8.1)

## 2021-05-05 LAB — CBC WITH DIFFERENTIAL (CANCER CENTER ONLY)
Abs Immature Granulocytes: 0.04 10*3/uL (ref 0.00–0.07)
Basophils Absolute: 0.1 10*3/uL (ref 0.0–0.1)
Basophils Relative: 1 %
Eosinophils Absolute: 0.4 10*3/uL (ref 0.0–0.5)
Eosinophils Relative: 5 %
HCT: 32.8 % — ABNORMAL LOW (ref 36.0–46.0)
Hemoglobin: 10 g/dL — ABNORMAL LOW (ref 12.0–15.0)
Immature Granulocytes: 0 %
Lymphocytes Relative: 15 %
Lymphs Abs: 1.4 10*3/uL (ref 0.7–4.0)
MCH: 22.2 pg — ABNORMAL LOW (ref 26.0–34.0)
MCHC: 30.5 g/dL (ref 30.0–36.0)
MCV: 72.7 fL — ABNORMAL LOW (ref 80.0–100.0)
Monocytes Absolute: 0.6 10*3/uL (ref 0.1–1.0)
Monocytes Relative: 7 %
Neutro Abs: 6.8 10*3/uL (ref 1.7–7.7)
Neutrophils Relative %: 72 %
Platelet Count: 416 10*3/uL — ABNORMAL HIGH (ref 150–400)
RBC: 4.51 MIL/uL (ref 3.87–5.11)
RDW: 16.7 % — ABNORMAL HIGH (ref 11.5–15.5)
WBC Count: 9.4 10*3/uL (ref 4.0–10.5)
nRBC: 0 % (ref 0.0–0.2)

## 2021-05-05 LAB — VITAMIN B12: Vitamin B-12: 468 pg/mL (ref 180–914)

## 2021-05-06 ENCOUNTER — Telehealth: Payer: Self-pay | Admitting: Hematology

## 2021-05-06 LAB — INTRINSIC FACTOR ANTIBODIES: Intrinsic Factor: 1 AU/mL (ref 0.0–1.1)

## 2021-05-06 LAB — FOLATE RBC
Folate, Hemolysate: 433 ng/mL
Folate, RBC: 1281 ng/mL (ref >498)
Hematocrit: 33.8 % — ABNORMAL LOW (ref 34.0–46.6)

## 2021-05-06 LAB — IRON AND TIBC
Iron: 28 ug/dL (ref 28–170)
Saturation Ratios: 6 % — ABNORMAL LOW (ref 10.4–31.8)
TIBC: 476 ug/dL — ABNORMAL HIGH (ref 250–450)
UIBC: 448 ug/dL

## 2021-05-06 LAB — ANTI-PARIETAL ANTIBODY: Parietal Cell Antibody-IgG: 6.5 Units (ref 0.0–20.0)

## 2021-05-06 LAB — FERRITIN: Ferritin: 9 ng/mL — ABNORMAL LOW (ref 11–307)

## 2021-05-06 NOTE — Telephone Encounter (Signed)
Scheduled follow-up appointments per 7/21 los. Patient is aware. 

## 2021-05-09 ENCOUNTER — Inpatient Hospital Stay: Payer: No Typology Code available for payment source

## 2021-05-09 ENCOUNTER — Other Ambulatory Visit: Payer: Self-pay

## 2021-05-09 VITALS — BP 136/80 | HR 98 | Temp 98.3°F | Resp 17

## 2021-05-09 DIAGNOSIS — D509 Iron deficiency anemia, unspecified: Secondary | ICD-10-CM

## 2021-05-09 MED ORDER — METHYLPREDNISOLONE SODIUM SUCC 40 MG IJ SOLR
INTRAMUSCULAR | Status: AC
  Filled 2021-05-09: qty 1

## 2021-05-09 MED ORDER — SODIUM CHLORIDE 0.9 % IV SOLN
300.0000 mg | Freq: Once | INTRAVENOUS | Status: AC
  Administered 2021-05-09: 300 mg via INTRAVENOUS
  Filled 2021-05-09: qty 300

## 2021-05-09 MED ORDER — METHYLPREDNISOLONE SODIUM SUCC 40 MG IJ SOLR
40.0000 mg | Freq: Once | INTRAMUSCULAR | Status: AC
  Administered 2021-05-09: 40 mg via INTRAVENOUS

## 2021-05-09 MED ORDER — SODIUM CHLORIDE 0.9 % IV SOLN
Freq: Once | INTRAVENOUS | Status: AC
  Filled 2021-05-09: qty 250

## 2021-05-09 MED ORDER — ACETAMINOPHEN 325 MG PO TABS: 650.0000 mg | ORAL_TABLET | Freq: Once | ORAL | Status: DC

## 2021-05-09 NOTE — Patient Instructions (Signed)

## 2021-05-09 NOTE — Progress Notes (Signed)
Patient observed for 30 minutes post Venofer infusion. Tolerated treatment well. VSS. Patient ambulatory to lobby.

## 2021-05-12 ENCOUNTER — Encounter: Payer: Self-pay | Admitting: Hematology

## 2021-05-16 ENCOUNTER — Other Ambulatory Visit: Payer: Self-pay

## 2021-05-16 ENCOUNTER — Inpatient Hospital Stay: Payer: No Typology Code available for payment source | Attending: Hematology

## 2021-05-16 VITALS — BP 124/60 | HR 83 | Temp 97.9°F | Resp 18

## 2021-05-16 DIAGNOSIS — D509 Iron deficiency anemia, unspecified: Secondary | ICD-10-CM | POA: Insufficient documentation

## 2021-05-16 MED ORDER — SODIUM CHLORIDE 0.9 % IV SOLN
300.0000 mg | Freq: Once | INTRAVENOUS | Status: AC
  Administered 2021-05-16: 300 mg via INTRAVENOUS
  Filled 2021-05-16: qty 300

## 2021-05-16 MED ORDER — SODIUM CHLORIDE 0.9 % IV SOLN
Freq: Once | INTRAVENOUS | Status: AC
  Filled 2021-05-16: qty 250

## 2021-05-16 MED ORDER — ACETAMINOPHEN 325 MG PO TABS: 650.0000 mg | ORAL_TABLET | Freq: Once | ORAL | Status: DC

## 2021-05-16 MED ORDER — METHYLPREDNISOLONE SODIUM SUCC 40 MG IJ SOLR
40.0000 mg | Freq: Once | INTRAMUSCULAR | Status: AC
  Administered 2021-05-16: 40 mg via INTRAVENOUS

## 2021-05-16 MED ORDER — METHYLPREDNISOLONE SODIUM SUCC 40 MG IJ SOLR
INTRAMUSCULAR | Status: AC
  Filled 2021-05-16: qty 1

## 2021-05-16 NOTE — Patient Instructions (Signed)

## 2021-05-17 ENCOUNTER — Encounter: Payer: Self-pay | Admitting: Hematology

## 2021-05-17 ENCOUNTER — Other Ambulatory Visit (HOSPITAL_COMMUNITY): Payer: Self-pay

## 2021-05-18 ENCOUNTER — Ambulatory Visit (INDEPENDENT_AMBULATORY_CARE_PROVIDER_SITE_OTHER): Payer: No Typology Code available for payment source | Admitting: Physician Assistant

## 2021-05-18 ENCOUNTER — Encounter: Payer: Self-pay | Admitting: Physician Assistant

## 2021-05-18 ENCOUNTER — Other Ambulatory Visit: Payer: Self-pay

## 2021-05-18 VITALS — BP 157/89 | HR 80 | Temp 97.5°F | Ht 65.0 in | Wt 173.8 lb

## 2021-05-18 DIAGNOSIS — E1169 Type 2 diabetes mellitus with other specified complication: Secondary | ICD-10-CM

## 2021-05-18 DIAGNOSIS — D509 Iron deficiency anemia, unspecified: Secondary | ICD-10-CM

## 2021-05-18 DIAGNOSIS — E785 Hyperlipidemia, unspecified: Secondary | ICD-10-CM

## 2021-05-18 DIAGNOSIS — E1159 Type 2 diabetes mellitus with other circulatory complications: Secondary | ICD-10-CM

## 2021-05-18 DIAGNOSIS — F4323 Adjustment disorder with mixed anxiety and depressed mood: Secondary | ICD-10-CM

## 2021-05-18 DIAGNOSIS — E039 Hypothyroidism, unspecified: Secondary | ICD-10-CM

## 2021-05-18 DIAGNOSIS — I152 Hypertension secondary to endocrine disorders: Secondary | ICD-10-CM

## 2021-05-18 LAB — POCT GLYCOSYLATED HEMOGLOBIN (HGB A1C): Hemoglobin A1C: 8.3 % — AB (ref 4.0–5.6)

## 2021-05-18 LAB — POCT UA - MICROALBUMIN
Albumin/Creatinine Ratio, Urine, POC: <30
Creatinine, POC: 200 mg/dL
Microalbumin Ur, POC: 80 mg/L

## 2021-05-18 NOTE — Assessment & Plan Note (Signed)
-  PHQ-9 score of 0, GAD-7 sore of 0. -Continue current medication regimen. -Will continue to monitor.

## 2021-05-18 NOTE — Progress Notes (Signed)
Established Patient Office Visit  Subjective:  Patient ID: Robin Greene, female    DOB: [birth year]  Age: 65 y.o. MRN: [id number]  CC:  Chief Complaint  Patient presents with   Follow-up   Diabetes   Hypertension    HPI Robin Greene presents for follow up on diabetes mellitus, hypertension and hyperlipidemia.  Diabetes: Pt denies increased urination or thirst. Pt reports medication compliance. States no longer experiencing upset stomach with Ozempic. Does report constipation with iron therapy but has started probiotic which has helped. No hypoglycemic events. Checking glucose at home. FBS range 140-150. Reports is working on weight loss. Is doing [employer] weight loss program and trying to exercise more.  HTN: Pt denies chest pain, palpitations, dizziness or lower extremity swelling. Taking medication as directed without side effects. Pt follows a low salt diet.  HLD: Pt has a history of statin intolerance. Working on weight loss.  Mood: Patient reports the increased dose of Prozac has helped with managing her stress better and mood is more stable. Denies SI/HI.  Past Medical History:  Diagnosis Date   Anemia    Arthritis    Complication of anesthesia    Diabetes mellitus    dx 2010   GERD (gastroesophageal reflux disease)    occasional   will take OTC   HSV-1 infection    Hypertension    PONV (postoperative nausea and vomiting)    Vitamin D deficiency     Past Surgical History:  Procedure Laterality Date   ABDOMINAL HYSTERECTOMY     EYE SURGERY     INSERTION OF MESH N/A 04/30/2017   Procedure: INSERTION OF MESH;  Surgeon: Jackolyn Confer, MD;  Location: [place];  Service: General;  Laterality: N/A;   OVARY SURGERY     REFRACTIVE SURGERY     UMBILICAL HERNIA REPAIR N/A 04/30/2017   Procedure: UMBILICAL HERNIA REPAIR WITH MESH;  Surgeon: Jackolyn Confer, MD;  Location: [place];  Service: General;  Laterality: N/A;    Family History  Problem Relation Age of Onset    Sjogren's syndrome Mother    Hypertension Father    Hyperlipidemia Father    Healthy Sister     Social History   Socioeconomic History   Marital status: Married    Spouse name: Not on file   Number of children: Not on file   Years of education: Not on file   Highest education level: Not on file  Occupational History   Not on file  Tobacco Use   Smoking status: Never   Smokeless tobacco: Never  Substance and Sexual Activity   Alcohol use: Yes    Comment: occasionally   Drug use: No   Sexual activity: Yes    Birth control/protection: Surgical  Other Topics Concern   Not on file  Social History Narrative   Not on file   Social Determinants of Health   Financial Resource Strain: Not on file  Food Insecurity: Not on file  Transportation Needs: Not on file  Physical Activity: Not on file  Stress: Not on file  Social Connections: Not on file  Intimate Partner Violence: Not on file    Outpatient Medications Prior to Visit  Medication Sig Dispense Refill   blood glucose meter kit and supplies Dispense based on patient and insurance preference. Use three to four times weekly as directed. (FOR ICD-10 E10.9, E11.9). 1 each 0   ezetimibe (ZETIA) 10 MG tablet TAKE 1 TABLET BY MOUTH ONCE DAILY 90 tablet 0  FLUoxetine (PROZAC) 20 MG tablet Take 1 and 1/2 tablets (30 mg total) by mouth daily. 135 tablet 1   glipiZIDE (GLUCOTROL XL) 5 MG 24 hr tablet TAKE 1 TABLET BY MOUTH ONCE A DAY WITH BREAKFAST 90 tablet 1   glucose blood (FREESTYLE LITE) test strip USE TO CHECK BLOOD SUGAR 3 TO 4 TIMES A WEEK AS DIRECTED 100 strip 1   hydrochlorothiazide (HYDRODIURIL) 25 MG tablet Take 1 tablet by mouth daily. 90 tablet 0   Lancets (FREESTYLE) lancets USE TO CHECK BLOOD SUGAR 3 TO 4 TIMES A WEEK AS DIRECTED 100 each 1   liothyronine (CYTOMEL) 5 MCG tablet Take 2 tablets in the morning and 1 tablet in the afternoon. 270 tablet 0   lisinopril (ZESTRIL) 40 MG tablet TAKE 1 TABLET BY MOUTH ONCE  DAILY 90 tablet 0   metFORMIN (GLUCOPHAGE) 1000 MG tablet TAKE 1 TABLET BY MOUTH 2 TIMES DAILY WITH A MEAL 180 tablet 0   metroNIDAZOLE (METROGEL) 1 % gel Apply 1 application topically daily as needed (rosacea).     Semaglutide, 1 MG/DOSE, 4 MG/3ML SOPN INJECT 1 MG INTO THE SKIN ONCE A WEEK. 6 mL 1   sitaGLIPtin (JANUVIA) 100 MG tablet TAKE 1 TABLET BY MOUTH DAILY. 90 tablet 1   SYNTHROID 100 MCG tablet TAKE 1 TABLET BY MOUTH DAILY BEFORE BREAKFAST 90 tablet 0   terconazole (TERAZOL 7) 0.4 % vaginal cream PLACE 1 APPLICATOR VAGINALLY AT BEDTIME. 45 g 1   valACYclovir (VALTREX) 1000 MG tablet Take 500 mg by mouth 2 (two) times daily as needed (fever blisters). 1/2 tablet twice daily for 5 days as needed for flares     No facility-administered medications prior to visit.    Allergies  Allergen Reactions   Asa [Aspirin] Anaphylaxis   Crestor [Rosuvastatin Calcium] Other (See Comments)    Abnormal LFTs   Claritin [Loratadine] Other (See Comments)    Fainted twice    Invokana [Canagliflozin] Other (See Comments)    Genital yeast infections   Jardiance [Empagliflozin] Other (See Comments)    Genital yeast infection   Lipitor [Atorvastatin] Other (See Comments)    Muscle aches    Simvastatin Other (See Comments)    Muscle aches    ROS Review of Systems Review of Systems:  A fourteen system review of systems was performed and found to be positive as per HPI.   Objective:    Physical Exam General:  Pleasant and cooperative, in no acute distress   Neuro:  Alert and oriented,  extra-ocular muscles intact  HEENT:  Normocephalic, atraumatic, neck supple Skin:  no gross rash, warm, pink. Cardiac:  RRR, S1 S2 Respiratory:  ECTA B/L and A/P, Not using accessory muscles, speaking in full sentences- unlabored. Vascular:  Ext warm, no cyanosis apprec.; cap RF less 2 sec. No edema  Psych:  No HI/SI, judgement and insight good, Euthymic mood. Full Affect.  BP (!) 157/89   Pulse 80   Temp  (!) 97.5 F (36.4 C)   Ht '5\' 5"'  (1.651 m)   Wt 173 lb 12.8 oz (78.8 kg)   LMP  (LMP Unknown)   SpO2 99%   BMI 28.92 kg/m  Wt Readings from Last 3 Encounters:  05/18/21 173 lb 12.8 oz (78.8 kg)  05/05/21 176 lb 12.8 oz (80.2 kg)  02/15/21 182 lb 8 oz (82.8 kg)     Health Maintenance Due  Topic Date Due   COVID-19 Vaccine (1) Never done   OPHTHALMOLOGY EXAM  Never  done   DEXA SCAN  Never done   PNA vac Low Risk Adult (2 of 2 - PPSV23) 04/24/2019   COLONOSCOPY (Pts 45-37yr Insurance coverage will need to be confirmed)  12/29/2019   INFLUENZA VACCINE  05/16/2021    There are no preventive care reminders to display for this patient.  Lab Results  Component Value Date   TSH 5.660 (H) 04/14/2021   Lab Results  Component Value Date   WBC 9.4 05/05/2021   HGB 10.0 (L) 05/05/2021   HCT 33.8 (L) 05/05/2021   HCT 32.8 (L) 05/05/2021   MCV 72.7 (L) 05/05/2021   PLT 416 (H) 05/05/2021   Lab Results  Component Value Date   NA 135 05/05/2021   K 4.3 05/05/2021   CO2 27 05/05/2021   GLUCOSE 150 (H) 05/05/2021   BUN 13 05/05/2021   CREATININE 0.87 05/05/2021   BILITOT 0.5 05/05/2021   ALKPHOS 66 05/05/2021   AST 39 05/05/2021   ALT 35 05/05/2021   PROT 7.7 05/05/2021   ALBUMIN 4.2 05/05/2021   CALCIUM 10.1 05/05/2021   ANIONGAP 11 05/05/2021   EGFR 76 04/14/2021   Lab Results  Component Value Date   CHOL 186 02/11/2021   Lab Results  Component Value Date   HDL 48 02/11/2021   Lab Results  Component Value Date   LDLCALC 106 (H) 02/11/2021   Lab Results  Component Value Date   TRIG 183 (H) 02/11/2021   Lab Results  Component Value Date   CHOLHDL 3.9 02/11/2021   Lab Results  Component Value Date   HGBA1C 8.3 (A) 05/18/2021      Assessment & Plan:   Problem List Items Addressed This Visit       Cardiovascular and Mediastinum   Hypertension associated with diabetes ([place]  with superimposed white coat HTN (Chronic)    -Stable. -Continue current  medication regimen. Will collect CMP for medication monitoring. -Will continue to monitor.         Endocrine   Hypothyroidism (Chronic)    -Last TSH mildly elevated but improved from prior after medication adjustments. -Continue current medication regimen. -Rechecking thyroid labs today. Pending results will make medication adjustments if indicated.        Relevant Orders   TSH   T4, free   T3   Hyperlipidemia associated with type 2 diabetes mellitus (HCC) (Chronic)    -Repeating lipid panel today. -Continue Zetia. -Recommend to continue with weight loss efforts.        Type 2 diabetes mellitus with other specified complication (HCC) - Primary    -A1c today has improved from 9.1 to 8.3, will continue current medication regimen. Discussed with patient increasing Ozempic to 2 mg if A1c not at goal next visit. Patient verbalized understanding. -Encourage to continue with weight loss efforts and reducing carbohydrates/glucose intake. -Will continue to monitor.       Relevant Orders   POCT HgB A1C (Completed)   CBC with Differential/Platelet   Comprehensive metabolic panel   Lipid Profile   POCT UA - Microalbumin     Other   Anemia (Chronic)    -Followed by hematology/oncology, on iron infusion. -Reviewed most recent consult. Provided patient with hemoccult cards. -Follow up with gastroenterology as scheduled.        Adjustment disorder with mixed anxiety and depressed mood    -PHQ-9 score of 0, GAD-7 sore of 0. -Continue current medication regimen. -Will continue to monitor.        No orders  of the defined types were placed in this encounter.   Follow-up: Return in about 3 months (around 08/18/2021) for DM, HTN, mood.   Note:  This note was prepared with assistance of Dragon voice recognition software. Occasional wrong-word or sound-a-like substitutions may have occurred due to the inherent limitations of voice recognition software.  Lorrene Reid, PA-C

## 2021-05-18 NOTE — Assessment & Plan Note (Signed)
-  Repeating lipid panel today. -Continue Zetia. -Recommend to continue with weight loss efforts.

## 2021-05-18 NOTE — Assessment & Plan Note (Signed)
-  Stable. -Continue current medication regimen. -Will collect CMP for medication monitoring. -Will continue to monitor. 

## 2021-05-18 NOTE — Patient Instructions (Signed)
https://www.nhlbi.nih.gov/files/docs/public/heart/dash_brief.pdf">  DASH Eating Plan DASH stands for Dietary Approaches to Stop Hypertension. The DASH eating plan is a healthy eating plan that has been shown to: Reduce high blood pressure (hypertension). Reduce your risk for type 2 diabetes, heart disease, and stroke. Help with weight loss. What are tips for following this plan? Reading food labels Check food labels for the amount of salt (sodium) per serving. Choose foods with less than 5 percent of the Daily Value of sodium. Generally, foods with less than 300 milligrams (mg) of sodium per serving fit into this eating plan. To find whole grains, look for the word "whole" as the first word in the ingredient list. Shopping Buy products labeled as "low-sodium" or "no salt added." Buy fresh foods. Avoid canned foods and pre-made or frozen meals. Cooking Avoid adding salt when cooking. Use salt-free seasonings or herbs instead of table salt or sea salt. Check with your health care provider or pharmacist before using salt substitutes. Do not fry foods. Cook foods using healthy methods such as baking, boiling, grilling, roasting, and broiling instead. Cook with heart-healthy oils, such as olive, canola, avocado, soybean, or sunflower oil. Meal planning  Eat a balanced diet that includes: 4 or more servings of fruits and 4 or more servings of vegetables each day. Try to fill one-half of your plate with fruits and vegetables. 6-8 servings of whole grains each day. Less than 6 oz (170 g) of lean meat, poultry, or fish each day. A 3-oz (85-g) serving of meat is about the same size as a deck of cards. One egg equals 1 oz (28 g). 2-3 servings of low-fat dairy each day. One serving is 1 cup (237 mL). 1 serving of nuts, seeds, or beans 5 times each week. 2-3 servings of heart-healthy fats. Healthy fats called omega-3 fatty acids are found in foods such as walnuts, flaxseeds, fortified milks, and eggs.  These fats are also found in cold-water fish, such as sardines, salmon, and mackerel. Limit how much you eat of: Canned or prepackaged foods. Food that is high in trans fat, such as some fried foods. Food that is high in saturated fat, such as fatty meat. Desserts and other sweets, sugary drinks, and other foods with added sugar. Full-fat dairy products. Do not salt foods before eating. Do not eat more than 4 egg yolks a week. Try to eat at least 2 vegetarian meals a week. Eat more home-cooked food and less restaurant, buffet, and fast food.  Lifestyle When eating at a restaurant, ask that your food be prepared with less salt or no salt, if possible. If you drink alcohol: Limit how much you use to: 0-1 drink a day for women who are not pregnant. 0-2 drinks a day for men. Be aware of how much alcohol is in your drink. In the [place] one drink equals one 12 oz bottle of beer (355 mL), one 5 oz glass of wine (148 mL), or one 1 oz glass of hard liquor (44 mL). General information Avoid eating more than 2,300 mg of salt a day. If you have hypertension, you may need to reduce your sodium intake to 1,500 mg a day. Work with your health care provider to maintain a healthy body weight or to lose weight. Ask what an ideal weight is for you. Get at least 30 minutes of exercise that causes your heart to beat faster (aerobic exercise) most days of the week. Activities may include walking, swimming, or biking. Work with your health care provider   or dietitian to adjust your eating plan to your individual calorie needs. What foods should I eat? Fruits All fresh, dried, or frozen fruit. Canned fruit in natural juice (without addedsugar). Vegetables Fresh or frozen vegetables (raw, steamed, roasted, or grilled). Low-sodium or reduced-sodium tomato and vegetable juice. Low-sodium or reduced-sodium tomatosauce and tomato paste. Low-sodium or reduced-sodium canned vegetables. Grains Whole-grain or  whole-wheat bread. Whole-grain or whole-wheat pasta. Brown rice. Oatmeal. Quinoa. Bulgur. Whole-grain and low-sodium cereals. Pita bread.Low-fat, low-sodium crackers. Whole-wheat flour tortillas. Meats and other proteins Skinless chicken or turkey. Ground chicken or turkey. Pork with fat trimmed off. Fish and seafood. Egg whites. Dried beans, peas, or lentils. Unsalted nuts, nut butters, and seeds. Unsalted canned beans. Lean cuts of beef with fat trimmed off. Low-sodium, lean precooked or cured meat, such as sausages or meatloaves. Dairy Low-fat (1%) or fat-free (skim) milk. Reduced-fat, low-fat, or fat-free cheeses. Nonfat, low-sodium ricotta or cottage cheese. Low-fat or nonfatyogurt. Low-fat, low-sodium cheese. Fats and oils Soft margarine without trans fats. Vegetable oil. Reduced-fat, low-fat, or light mayonnaise and salad dressings (reduced-sodium). Canola, safflower, olive, avocado, soybean, andsunflower oils. Avocado. Seasonings and condiments Herbs. Spices. Seasoning mixes without salt. Other foods Unsalted popcorn and pretzels. Fat-free sweets. The items listed above may not be a complete list of foods and beverages you can eat. Contact a dietitian for more information. What foods should I avoid? Fruits Canned fruit in a light or heavy syrup. Fried fruit. Fruit in cream or buttersauce. Vegetables Creamed or fried vegetables. Vegetables in a cheese sauce. Regular canned vegetables (not low-sodium or reduced-sodium). Regular canned tomato sauce and paste (not low-sodium or reduced-sodium). Regular tomato and vegetable juice(not low-sodium or reduced-sodium). Pickles. Olives. Grains Baked goods made with fat, such as croissants, muffins, or some breads. Drypasta or rice meal packs. Meats and other proteins Fatty cuts of meat. Ribs. Fried meat. Bacon. Bologna, salami, and other precooked or cured meats, such as sausages or meat loaves. Fat from the back of a pig (fatback). Bratwurst.  Salted nuts and seeds. Canned beans with added salt. Canned orsmoked fish. Whole eggs or egg yolks. Chicken or turkey with skin. Dairy Whole or 2% milk, cream, and half-and-half. Whole or full-fat cream cheese. Whole-fat or sweetened yogurt. Full-fat cheese. Nondairy creamers. Whippedtoppings. Processed cheese and cheese spreads. Fats and oils Butter. Stick margarine. Lard. Shortening. Ghee. Bacon fat. Tropical oils, suchas coconut, palm kernel, or palm oil. Seasonings and condiments Onion salt, garlic salt, seasoned salt, table salt, and sea salt. Worcestershire sauce. Tartar sauce. Barbecue sauce. Teriyaki sauce. Soy sauce, including reduced-sodium. Steak sauce. Canned and packaged gravies. Fish sauce. Oyster sauce. Cocktail sauce. Store-bought horseradish. Ketchup. Mustard. Meat flavorings and tenderizers. Bouillon cubes. Hot sauces. Pre-made or packaged marinades. Pre-made or packaged taco seasonings. Relishes. Regular saladdressings. Other foods Salted popcorn and pretzels. The items listed above may not be a complete list of foods and beverages you should avoid. Contact a dietitian for more information. Where to find more information National Heart, Lung, and Blood Institute: www.nhlbi.nih.gov American Heart Association: www.heart.org Academy of Nutrition and Dietetics: www.eatright.org National Kidney Foundation: www.kidney.org Summary The DASH eating plan is a healthy eating plan that has been shown to reduce high blood pressure (hypertension). It may also reduce your risk for type 2 diabetes, heart disease, and stroke. When on the DASH eating plan, aim to eat more fresh fruits and vegetables, whole grains, lean proteins, low-fat dairy, and heart-healthy fats. With the DASH eating plan, you should limit salt (sodium) intake to 2,300   mg a day. If you have hypertension, you may need to reduce your sodium intake to 1,500 mg a day. Work with your health care provider or dietitian to adjust  your eating plan to your individual calorie needs. This information is not intended to replace advice given to you by your health care provider. Make sure you discuss any questions you have with your healthcare provider. Document Revised: 09/05/2019 Document Reviewed: 09/05/2019 Elsevier Patient Education  2022 Elsevier Inc.  

## 2021-05-18 NOTE — Assessment & Plan Note (Addendum)
-  Last TSH mildly elevated but improved from prior after medication adjustments. -Continue current medication regimen. -Rechecking thyroid labs today. Pending results will make medication adjustments if indicated.

## 2021-05-18 NOTE — Assessment & Plan Note (Signed)
-  A1c today has improved from 9.1 to 8.3, will continue current medication regimen. Discussed with patient increasing Ozempic to 2 mg if A1c not at goal next visit. Patient verbalized understanding. -Encourage to continue with weight loss efforts and reducing carbohydrates/glucose intake. -Will continue to monitor.

## 2021-05-18 NOTE — Assessment & Plan Note (Signed)
-  Followed by hematology/oncology, on iron infusion. -Reviewed most recent consult. Provided patient with hemoccult cards. -Follow up with gastroenterology as scheduled.

## 2021-05-19 ENCOUNTER — Other Ambulatory Visit: Payer: Self-pay | Admitting: Hematology

## 2021-05-19 LAB — LIPID PANEL
Chol/HDL Ratio: 3.5 ratio (ref 0.0–4.4)
Cholesterol, Total: 176 mg/dL (ref 100–199)
HDL: 51 mg/dL (ref >39)
LDL Chol Calc (NIH): 90 mg/dL (ref 0–99)
Triglycerides: 205 mg/dL — ABNORMAL HIGH (ref 0–149)
VLDL Cholesterol Cal: 35 mg/dL (ref 5–40)

## 2021-05-19 LAB — CBC WITH DIFFERENTIAL/PLATELET
Basophils Absolute: 0.1 10*3/uL (ref 0.0–0.2)
Basos: 1 %
EOS (ABSOLUTE): 0.5 10*3/uL — ABNORMAL HIGH (ref 0.0–0.4)
Eos: 6 %
Hematocrit: 35.1 % (ref 34.0–46.6)
Hemoglobin: 10.3 g/dL — ABNORMAL LOW (ref 11.1–15.9)
Immature Grans (Abs): 0.1 10*3/uL (ref 0.0–0.1)
Immature Granulocytes: 1 %
Lymphocytes Absolute: 1.6 10*3/uL (ref 0.7–3.1)
Lymphs: 19 %
MCH: 22.9 pg — ABNORMAL LOW (ref 26.6–33.0)
MCHC: 29.3 g/dL — ABNORMAL LOW (ref 31.5–35.7)
MCV: 78 fL — ABNORMAL LOW (ref 79–97)
Monocytes Absolute: 0.5 10*3/uL (ref 0.1–0.9)
Monocytes: 6 %
Neutrophils Absolute: 5.5 10*3/uL (ref 1.4–7.0)
Neutrophils: 67 %
Platelets: 417 10*3/uL (ref 150–450)
RBC: 4.5 x10E6/uL (ref 3.77–5.28)
RDW: 18.2 % — ABNORMAL HIGH (ref 11.7–15.4)
WBC: 8.2 10*3/uL (ref 3.4–10.8)

## 2021-05-19 LAB — COMPREHENSIVE METABOLIC PANEL
ALT: 40 IU/L — ABNORMAL HIGH (ref 0–32)
AST: 34 IU/L (ref 0–40)
Albumin/Globulin Ratio: 1.4 (ref 1.2–2.2)
Albumin: 4.4 g/dL (ref 3.8–4.8)
Alkaline Phosphatase: 83 IU/L (ref 44–121)
BUN/Creatinine Ratio: 16 (ref 12–28)
BUN: 11 mg/dL (ref 8–27)
Bilirubin Total: 0.2 mg/dL (ref 0.0–1.2)
CO2: 21 mmol/L (ref 20–29)
Calcium: 10.2 mg/dL (ref 8.7–10.3)
Chloride: 98 mmol/L (ref 96–106)
Creatinine, Ser: 0.7 mg/dL (ref 0.57–1.00)
Globulin, Total: 3.1 g/dL (ref 1.5–4.5)
Glucose: 196 mg/dL — ABNORMAL HIGH (ref 65–99)
Potassium: 4.5 mmol/L (ref 3.5–5.2)
Sodium: 138 mmol/L (ref 134–144)
Total Protein: 7.5 g/dL (ref 6.0–8.5)
eGFR: 95 mL/min/{1.73_m2} (ref >59)

## 2021-05-19 LAB — TSH: TSH: 3.94 u[IU]/mL (ref 0.450–4.500)

## 2021-05-19 LAB — T3: T3, Total: 218 ng/dL — ABNORMAL HIGH (ref 71–180)

## 2021-05-19 LAB — T4, FREE: Free T4: 1.22 ng/dL (ref 0.82–1.77)

## 2021-05-20 ENCOUNTER — Telehealth: Payer: Self-pay | Admitting: Hematology

## 2021-05-20 NOTE — Telephone Encounter (Signed)
Scheduled appts per 8/4 sch msg. Pt aware.

## 2021-05-24 ENCOUNTER — Inpatient Hospital Stay: Payer: No Typology Code available for payment source

## 2021-05-24 ENCOUNTER — Other Ambulatory Visit: Payer: Self-pay

## 2021-05-24 VITALS — BP 138/67 | HR 72 | Temp 98.3°F | Resp 18

## 2021-05-24 DIAGNOSIS — D509 Iron deficiency anemia, unspecified: Secondary | ICD-10-CM

## 2021-05-24 MED ORDER — SODIUM CHLORIDE 0.9 % IV SOLN
300.0000 mg | Freq: Once | INTRAVENOUS | Status: AC
  Administered 2021-05-24: 300 mg via INTRAVENOUS
  Filled 2021-05-24: qty 300

## 2021-05-24 MED ORDER — METHYLPREDNISOLONE SODIUM SUCC 40 MG IJ SOLR
40.0000 mg | Freq: Once | INTRAMUSCULAR | Status: AC
  Administered 2021-05-24: 40 mg via INTRAVENOUS

## 2021-05-24 MED ORDER — METHYLPREDNISOLONE SODIUM SUCC 40 MG IJ SOLR
INTRAMUSCULAR | Status: AC
  Filled 2021-05-24: qty 1

## 2021-05-24 MED ORDER — ACETAMINOPHEN 325 MG PO TABS
650.0000 mg | ORAL_TABLET | Freq: Once | ORAL | Status: AC
  Administered 2021-05-24: 650 mg via ORAL

## 2021-05-24 MED ORDER — SODIUM CHLORIDE 0.9 % IV SOLN
Freq: Once | INTRAVENOUS | Status: AC
  Filled 2021-05-24: qty 250

## 2021-05-24 MED ORDER — ACETAMINOPHEN 325 MG PO TABS
ORAL_TABLET | ORAL | Status: AC
  Filled 2021-05-24: qty 2

## 2021-05-31 ENCOUNTER — Other Ambulatory Visit: Payer: Self-pay

## 2021-05-31 ENCOUNTER — Inpatient Hospital Stay: Payer: No Typology Code available for payment source

## 2021-05-31 VITALS — BP 142/71 | HR 75 | Temp 98.2°F | Resp 17

## 2021-05-31 DIAGNOSIS — D509 Iron deficiency anemia, unspecified: Secondary | ICD-10-CM

## 2021-05-31 MED ORDER — SODIUM CHLORIDE 0.9 % IV SOLN
300.0000 mg | Freq: Once | INTRAVENOUS | Status: AC
  Administered 2021-05-31: 300 mg via INTRAVENOUS
  Filled 2021-05-31: qty 300

## 2021-05-31 MED ORDER — METHYLPREDNISOLONE SODIUM SUCC 40 MG IJ SOLR
40.0000 mg | Freq: Once | INTRAMUSCULAR | Status: AC
  Administered 2021-05-31: 40 mg via INTRAVENOUS
  Filled 2021-05-31: qty 1

## 2021-05-31 MED ORDER — ACETAMINOPHEN 325 MG PO TABS
650.0000 mg | ORAL_TABLET | Freq: Once | ORAL | Status: AC
  Administered 2021-05-31: 650 mg via ORAL
  Filled 2021-05-31: qty 2

## 2021-05-31 MED ORDER — SODIUM CHLORIDE 0.9 % IV SOLN: Freq: Once | INTRAVENOUS | Status: AC

## 2021-05-31 NOTE — Patient Instructions (Signed)

## 2021-06-07 ENCOUNTER — Other Ambulatory Visit: Payer: Self-pay | Admitting: Physician Assistant

## 2021-06-07 ENCOUNTER — Other Ambulatory Visit (HOSPITAL_COMMUNITY): Payer: Self-pay

## 2021-06-07 ENCOUNTER — Encounter: Payer: Self-pay | Admitting: Physician Assistant

## 2021-06-07 MED ORDER — LIOTHYRONINE SODIUM 5 MCG PO TABS
ORAL_TABLET | Freq: Two times a day (BID) | ORAL | 0 refills | Status: DC
  Filled 2021-06-07 (×2): qty 120, 60d supply, fill #0

## 2021-06-08 ENCOUNTER — Telehealth: Payer: Self-pay | Admitting: Hematology

## 2021-06-08 NOTE — Telephone Encounter (Signed)
R/s appts per 8/23 sch msg. Pt aware.  

## 2021-06-13 ENCOUNTER — Encounter: Payer: Self-pay | Admitting: Hematology

## 2021-06-13 ENCOUNTER — Other Ambulatory Visit (HOSPITAL_COMMUNITY): Payer: Self-pay

## 2021-06-13 ENCOUNTER — Other Ambulatory Visit: Payer: Self-pay | Admitting: Physician Assistant

## 2021-06-13 DIAGNOSIS — E039 Hypothyroidism, unspecified: Secondary | ICD-10-CM

## 2021-06-13 MED ORDER — METFORMIN HCL 1000 MG PO TABS
ORAL_TABLET | Freq: Two times a day (BID) | ORAL | 0 refills | Status: DC
  Filled 2021-06-13: qty 180, 90d supply, fill #0

## 2021-06-13 MED ORDER — LISINOPRIL 40 MG PO TABS
ORAL_TABLET | Freq: Every day | ORAL | 0 refills | Status: DC
  Filled 2021-06-13: qty 90, 90d supply, fill #0

## 2021-06-13 MED ORDER — SYNTHROID 100 MCG PO TABS
100.0000 ug | ORAL_TABLET | Freq: Every day | ORAL | 0 refills | Status: DC
  Filled 2021-06-13: qty 90, 90d supply, fill #0

## 2021-06-14 ENCOUNTER — Other Ambulatory Visit (HOSPITAL_COMMUNITY): Payer: Self-pay

## 2021-06-14 ENCOUNTER — Other Ambulatory Visit: Payer: Self-pay | Admitting: Physician Assistant

## 2021-06-14 DIAGNOSIS — E1169 Type 2 diabetes mellitus with other specified complication: Secondary | ICD-10-CM

## 2021-06-14 MED ORDER — OZEMPIC (1 MG/DOSE) 4 MG/3ML ~~LOC~~ SOPN
1.0000 mg | PEN_INJECTOR | SUBCUTANEOUS | 1 refills | Status: DC
  Filled 2021-06-14: qty 3, 28d supply, fill #0
  Filled 2021-07-12: qty 9, 84d supply, fill #1

## 2021-06-15 ENCOUNTER — Telehealth: Payer: Self-pay | Admitting: Physician Assistant

## 2021-06-15 NOTE — Telephone Encounter (Signed)
Left msg for patient to call back to discuss hemoccult.  AS, CMA

## 2021-06-30 ENCOUNTER — Other Ambulatory Visit: Payer: Self-pay

## 2021-06-30 ENCOUNTER — Other Ambulatory Visit: Payer: Self-pay | Admitting: Obstetrics and Gynecology

## 2021-06-30 ENCOUNTER — Other Ambulatory Visit: Payer: No Typology Code available for payment source

## 2021-06-30 DIAGNOSIS — E039 Hypothyroidism, unspecified: Secondary | ICD-10-CM

## 2021-06-30 DIAGNOSIS — Z1231 Encounter for screening mammogram for malignant neoplasm of breast: Secondary | ICD-10-CM

## 2021-07-01 LAB — TSH: TSH: 3.93 u[IU]/mL (ref 0.450–4.500)

## 2021-07-01 LAB — T4, FREE: Free T4: 1.14 ng/dL (ref 0.82–1.77)

## 2021-07-01 LAB — T3: T3, Total: 192 ng/dL — ABNORMAL HIGH (ref 71–180)

## 2021-07-04 ENCOUNTER — Ambulatory Visit: Payer: No Typology Code available for payment source | Admitting: Hematology

## 2021-07-04 ENCOUNTER — Other Ambulatory Visit: Payer: No Typology Code available for payment source

## 2021-07-05 ENCOUNTER — Other Ambulatory Visit (HOSPITAL_COMMUNITY): Payer: Self-pay

## 2021-07-05 MED ORDER — PEG 3350-KCL-NA BICARB-NACL 420 G PO SOLR
ORAL | 0 refills | Status: DC
  Filled 2021-07-05: qty 4000, 1d supply, fill #0

## 2021-07-06 ENCOUNTER — Other Ambulatory Visit (HOSPITAL_COMMUNITY): Payer: Self-pay

## 2021-07-12 ENCOUNTER — Other Ambulatory Visit (HOSPITAL_COMMUNITY): Payer: Self-pay

## 2021-07-12 ENCOUNTER — Other Ambulatory Visit: Payer: Self-pay | Admitting: Physician Assistant

## 2021-07-12 DIAGNOSIS — I152 Hypertension secondary to endocrine disorders: Secondary | ICD-10-CM

## 2021-07-12 DIAGNOSIS — E1159 Type 2 diabetes mellitus with other circulatory complications: Secondary | ICD-10-CM

## 2021-07-12 MED ORDER — EZETIMIBE 10 MG PO TABS
ORAL_TABLET | Freq: Every day | ORAL | 0 refills | Status: DC
  Filled 2021-07-12: qty 90, 90d supply, fill #0

## 2021-07-12 MED ORDER — HYDROCHLOROTHIAZIDE 25 MG PO TABS
ORAL_TABLET | Freq: Every day | ORAL | 0 refills | Status: DC
  Filled 2021-07-12: qty 90, 90d supply, fill #0

## 2021-07-13 ENCOUNTER — Other Ambulatory Visit: Payer: Self-pay | Admitting: Gastroenterology

## 2021-07-13 DIAGNOSIS — K449 Diaphragmatic hernia without obstruction or gangrene: Secondary | ICD-10-CM

## 2021-07-14 ENCOUNTER — Other Ambulatory Visit: Payer: Self-pay | Admitting: *Deleted

## 2021-07-14 DIAGNOSIS — D509 Iron deficiency anemia, unspecified: Secondary | ICD-10-CM

## 2021-07-15 ENCOUNTER — Inpatient Hospital Stay: Payer: No Typology Code available for payment source | Attending: Hematology

## 2021-07-15 ENCOUNTER — Inpatient Hospital Stay (HOSPITAL_BASED_OUTPATIENT_CLINIC_OR_DEPARTMENT_OTHER): Payer: No Typology Code available for payment source | Admitting: Hematology

## 2021-07-15 ENCOUNTER — Other Ambulatory Visit: Payer: Self-pay

## 2021-07-15 VITALS — BP 181/76 | HR 77 | Temp 98.3°F | Resp 17 | Wt 176.6 lb

## 2021-07-15 DIAGNOSIS — D509 Iron deficiency anemia, unspecified: Secondary | ICD-10-CM

## 2021-07-15 LAB — CBC WITH DIFFERENTIAL (CANCER CENTER ONLY)
Abs Immature Granulocytes: 0.02 10*3/uL (ref 0.00–0.07)
Basophils Absolute: 0.1 10*3/uL (ref 0.0–0.1)
Basophils Relative: 1 %
Eosinophils Absolute: 0.4 10*3/uL (ref 0.0–0.5)
Eosinophils Relative: 7 %
HCT: 37.8 % (ref 36.0–46.0)
Hemoglobin: 12.6 g/dL (ref 12.0–15.0)
Immature Granulocytes: 0 %
Lymphocytes Relative: 21 %
Lymphs Abs: 1.2 10*3/uL (ref 0.7–4.0)
MCH: 27.4 pg (ref 26.0–34.0)
MCHC: 33.3 g/dL (ref 30.0–36.0)
MCV: 82.2 fL (ref 80.0–100.0)
Monocytes Absolute: 0.3 10*3/uL (ref 0.1–1.0)
Monocytes Relative: 6 %
Neutro Abs: 3.4 10*3/uL (ref 1.7–7.7)
Neutrophils Relative %: 65 %
Platelet Count: 284 10*3/uL (ref 150–400)
RBC: 4.6 MIL/uL (ref 3.87–5.11)
RDW: 18 % — ABNORMAL HIGH (ref 11.5–15.5)
WBC Count: 5.4 10*3/uL (ref 4.0–10.5)
nRBC: 0 % (ref 0.0–0.2)

## 2021-07-15 LAB — CMP (CANCER CENTER ONLY)
ALT: 51 U/L — ABNORMAL HIGH (ref 0–44)
AST: 45 U/L — ABNORMAL HIGH (ref 15–41)
Albumin: 3.9 g/dL (ref 3.5–5.0)
Alkaline Phosphatase: 84 U/L (ref 38–126)
Anion gap: 13 (ref 5–15)
BUN: 10 mg/dL (ref 8–23)
CO2: 26 mmol/L (ref 22–32)
Calcium: 9.7 mg/dL (ref 8.9–10.3)
Chloride: 99 mmol/L (ref 98–111)
Creatinine: 0.79 mg/dL (ref 0.44–1.00)
GFR, Estimated: >60 mL/min (ref >60)
Glucose, Bld: 239 mg/dL — ABNORMAL HIGH (ref 70–99)
Potassium: 4.3 mmol/L (ref 3.5–5.1)
Sodium: 138 mmol/L (ref 135–145)
Total Bilirubin: 0.4 mg/dL (ref 0.3–1.2)
Total Protein: 7 g/dL (ref 6.5–8.1)

## 2021-07-15 LAB — FERRITIN: Ferritin: 363 ng/mL — ABNORMAL HIGH (ref 11–307)

## 2021-07-15 LAB — IRON AND TIBC
Iron: 68 ug/dL (ref 41–142)
Saturation Ratios: 24 % (ref 21–57)
TIBC: 286 ug/dL (ref 236–444)
UIBC: 218 ug/dL (ref 120–384)

## 2021-07-15 LAB — VITAMIN B12: Vitamin B-12: 406 pg/mL (ref 180–914)

## 2021-07-17 LAB — FOLATE RBC
Folate, Hemolysate: 404 ng/mL
Folate, RBC: 1000 ng/mL (ref >498)
Hematocrit: 40.4 % (ref 34.0–46.6)

## 2021-07-19 ENCOUNTER — Other Ambulatory Visit: Payer: Self-pay | Admitting: Gastroenterology

## 2021-07-19 ENCOUNTER — Ambulatory Visit
Admission: RE | Admit: 2021-07-19 | Discharge: 2021-07-19 | Disposition: A | Discharge status: Home / Self Care | Payer: No Typology Code available for payment source | Source: Ambulatory Visit | Attending: Gastroenterology | Admitting: Gastroenterology

## 2021-07-19 ENCOUNTER — Encounter: Payer: Self-pay | Admitting: Hematology

## 2021-07-19 DIAGNOSIS — K449 Diaphragmatic hernia without obstruction or gangrene: Secondary | ICD-10-CM

## 2021-07-21 ENCOUNTER — Other Ambulatory Visit: Payer: Self-pay | Admitting: Physician Assistant

## 2021-07-21 ENCOUNTER — Other Ambulatory Visit (HOSPITAL_COMMUNITY): Payer: Self-pay

## 2021-07-21 ENCOUNTER — Encounter: Payer: Self-pay | Admitting: Physician Assistant

## 2021-07-21 MED ORDER — LIOTHYRONINE SODIUM 5 MCG PO TABS
ORAL_TABLET | ORAL | 0 refills | Status: DC
  Filled 2021-07-21: qty 270, fill #0
  Filled 2021-07-25: qty 42, 14d supply, fill #0
  Filled 2021-07-25: qty 228, 76d supply, fill #0
  Filled 2021-08-08: qty 228, 76d supply, fill #1

## 2021-07-21 MED ORDER — LIOTHYRONINE SODIUM 5 MCG PO TABS
ORAL_TABLET | ORAL | 0 refills | Status: DC
  Filled 2021-07-21: qty 270, 90d supply, fill #0

## 2021-07-22 ENCOUNTER — Encounter: Payer: Self-pay | Admitting: Hematology

## 2021-07-22 NOTE — Progress Notes (Signed)
HEMATOLOGY/ONCOLOGY CLINIC NOTE  Date of Service: .07/15/2021   Patient Care Team: Lorrene Reid, PA-C as PCP - Windle Guard, MD as Consulting Physician (Gastroenterology) Delrae Rend, MD as Consulting Physician (Endocrinology) Druscilla Brownie, MD as Consulting Physician (Dermatology) Arvella Nigh, MD as Consulting Physician (Obstetrics and Gynecology) Vevelyn Royals, MD as Consulting Physician (Ophthalmology)  CHIEF COMPLAINTS/PURPOSE OF CONSULTATION:  Anemia  INTERVAL HISTORY  Robin Greene is a wonderful 65 y.o. female who has been referred to [place] by Lorrene Reid, PA-C for evaluation and management of anemia. The pt reports that she is doing well overall.  The pt reports that she tolerated the IV iron well with no acute toxicities.  Energy levels are much improved.  Not eating as much eyes. Labs done today show normal hemoglobin of 12.6 up from 10.3.  Normal platelets and WBC count. CMP with some elevation of AST and ALT and hyperglycemia. Ferritin 363 with an iron saturation of 24% B12 level within normal limits at 406, RBC folate within normal limits.  On review of systems, patient reports no overt GI bleeding.  No fevers no chills no night sweats.  MEDICAL HISTORY:  Past Medical History:  Diagnosis Date   Anemia    Arthritis    Complication of anesthesia    Diabetes mellitus    dx 2010   GERD (gastroesophageal reflux disease)    occasional   will take OTC   HSV-1 infection    Hypertension    PONV (postoperative nausea and vomiting)    Vitamin D deficiency     SURGICAL HISTORY: Past Surgical History:  Procedure Laterality Date   ABDOMINAL HYSTERECTOMY     EYE SURGERY     INSERTION OF MESH N/A 04/30/2017   Procedure: INSERTION OF MESH;  Surgeon: Jackolyn Confer, MD;  Location: [place];  Service: General;  Laterality: N/A;   OVARY SURGERY     REFRACTIVE SURGERY     UMBILICAL HERNIA REPAIR N/A 04/30/2017   Procedure: UMBILICAL HERNIA  REPAIR WITH MESH;  Surgeon: Jackolyn Confer, MD;  Location: [place];  Service: General;  Laterality: N/A;    SOCIAL HISTORY: Social History   Socioeconomic History   Marital status: Married    Spouse name: Not on file   Number of children: Not on file   Years of education: Not on file   Highest education level: Not on file  Occupational History   Not on file  Tobacco Use   Smoking status: Never   Smokeless tobacco: Never  Substance and Sexual Activity   Alcohol use: Yes    Comment: occasionally   Drug use: No   Sexual activity: Yes    Birth control/protection: Surgical  Other Topics Concern   Not on file  Social History Narrative   Not on file   Social Determinants of Health   Financial Resource Strain: Not on file  Food Insecurity: Not on file  Transportation Needs: Not on file  Physical Activity: Not on file  Stress: Not on file  Social Connections: Not on file  Intimate Partner Violence: Not on file    FAMILY HISTORY: Family History  Problem Relation Age of Onset   Sjogren's syndrome Mother    Hypertension Father    Hyperlipidemia Father    Healthy Sister     ALLERGIES:  is allergic to asa [aspirin], crestor Contractor calcium], claritin [loratadine], invokana [canagliflozin], jardiance [empagliflozin], lipitor [atorvastatin], and simvastatin.  MEDICATIONS:  Current Outpatient Medications  Medication Sig Dispense Refill  blood glucose meter kit and supplies Dispense based on patient and insurance preference. Use three to four times weekly as directed. (FOR ICD-10 E10.9, E11.9). 1 each 0   ezetimibe (ZETIA) 10 MG tablet TAKE 1 TABLET BY MOUTH ONCE DAILY 90 tablet 0   FLUoxetine (PROZAC) 20 MG tablet Take 1 and 1/2 tablets (30 mg total) by mouth daily. 135 tablet 1   glipiZIDE (GLUCOTROL XL) 5 MG 24 hr tablet TAKE 1 TABLET BY MOUTH ONCE A DAY WITH BREAKFAST 90 tablet 1   glucose blood (FREESTYLE LITE) test strip USE TO CHECK BLOOD SUGAR 3 TO 4 TIMES A WEEK  AS DIRECTED 100 strip 1   hydrochlorothiazide (HYDRODIURIL) 25 MG tablet Take 1 tablet by mouth daily. 90 tablet 0   Lancets (FREESTYLE) lancets USE TO CHECK BLOOD SUGAR 3 TO 4 TIMES A WEEK AS DIRECTED 100 each 1   liothyronine (CYTOMEL) 5 MCG tablet Take 2 tablets in the morning and 1 tablet in the afternoon. 270 tablet 0   liothyronine (CYTOMEL) 5 MCG tablet Take 2 tablets (10 mcg total) by mouth in the morning AND 1 tablet (5 mcg total) daily in the afternoon. 270 tablet 0   lisinopril (ZESTRIL) 40 MG tablet TAKE 1 TABLET BY MOUTH ONCE DAILY 90 tablet 0   metFORMIN (GLUCOPHAGE) 1000 MG tablet TAKE 1 TABLET BY MOUTH 2 TIMES DAILY WITH A MEAL 180 tablet 0   metroNIDAZOLE (METROGEL) 1 % gel Apply 1 application topically daily as needed (rosacea).     polyethylene glycol-electrolytes (NULYTELY) 420 g solution Use as directed 4000 mL 0   Semaglutide, 1 MG/DOSE, (OZEMPIC, 1 MG/DOSE,) 4 MG/3ML SOPN INJECT 1 MG INTO THE SKIN ONCE A WEEK. 6 mL 1   sitaGLIPtin (JANUVIA) 100 MG tablet TAKE 1 TABLET BY MOUTH DAILY. 90 tablet 1   SYNTHROID 100 MCG tablet TAKE 1 TABLET BY MOUTH DAILY BEFORE BREAKFAST 90 tablet 0   valACYclovir (VALTREX) 1000 MG tablet Take 500 mg by mouth 2 (two) times daily as needed (fever blisters). 1/2 tablet twice daily for 5 days as needed for flares     No current facility-administered medications for this visit.    REVIEW OF SYSTEMS:    .10 Point review of Systems was done is negative except as noted above.   PHYSICAL EXAMINATION: ECOG PERFORMANCE STATUS: 1 - Symptomatic but completely ambulatory  . Vitals:   07/15/21 1022  BP: (!) 181/76  Pulse: 77  Resp: 17  Temp: 98.3 F (36.8 C)  SpO2: 97%   Filed Weights   07/15/21 1022  Weight: 176 lb 9.6 oz (80.1 kg)   .Body mass index is 29.39 kg/m. Marland Kitchen GENERAL:alert, in no acute distress and comfortable SKIN: no acute rashes, no significant lesions EYES: conjunctiva are pink and non-injected, sclera  anicteric OROPHARYNX: MMM, no exudates, no oropharyngeal erythema or ulceration NECK: supple, no JVD LYMPH:  no palpable lymphadenopathy in the cervical, axillary or inguinal regions LUNGS: clear to auscultation b/l with normal respiratory effort HEART: regular rate & rhythm ABDOMEN:  normoactive bowel sounds , non tender, not distended. Extremity: no pedal edema PSYCH: alert & oriented x 3 with fluent speech NEURO: no focal motor/sensory deficits   LABORATORY DATA:  I have reviewed the data as listed  . CBC Latest Ref Rng & Units 07/15/2021 07/15/2021 05/18/2021  WBC 4.0 - 10.5 K/uL 5.4 - 8.2  Hemoglobin 12.0 - 15.0 g/dL 12.6 - 10.3(L)  Hematocrit 34.0 - 46.6 % 37.8 40.4 35.1  Platelets 150 -  400 K/uL 284 - 417    . CMP Latest Ref Rng & Units 07/15/2021 05/18/2021 05/05/2021  Glucose 70 - 99 mg/dL 239(H) 196(H) 150(H)  BUN 8 - 23 mg/dL '10 11 13  ' Creatinine 0.44 - 1.00 mg/dL 0.79 0.70 0.87  Sodium 135 - 145 mmol/L 138 138 135  Potassium 3.5 - 5.1 mmol/L 4.3 4.5 4.3  Chloride 98 - 111 mmol/L 99 98 97(L)  CO2 22 - 32 mmol/L '26 21 27  ' Calcium 8.9 - 10.3 mg/dL 9.7 10.2 10.1  Total Protein 6.5 - 8.1 g/dL 7.0 7.5 7.7  Total Bilirubin 0.3 - 1.2 mg/dL 0.4 0.2 0.5  Alkaline Phos 38 - 126 U/L 84 83 66  AST 15 - 41 U/L 45(H) 34 39  ALT 0 - 44 U/L 51(H) 40(H) 35   . Lab Results  Component Value Date   IRON 68 07/15/2021   TIBC 286 07/15/2021   IRONPCTSAT 24 07/15/2021   (Iron and TIBC)  Lab Results  Component Value Date   FERRITIN 363 (H) 07/15/2021   B12 -- 468   RADIOGRAPHIC STUDIES: I have personally reviewed the radiological images as listed and agreed with the findings in the report. DG UGI W DOUBLE CM (HD BA)  Result Date: 07/19/2021 CLINICAL DATA:  Hiatal hernia.  Iron deficiency anemia. EXAM: UPPER GI SERIES WITH KUB TECHNIQUE: After obtaining a scout radiograph a routine upper GI series was performed using thin and high density barium. This exam was performed by  Rushie Nyhan NP, and was supervised and interpreted by Dr. Earle Gell. FLUOROSCOPY TIME:  Fluoroscopy Time:  2 minutes 24 seconds Radiation Exposure Index (if provided by the fluoroscopic device): 33.4 mGy Number of Acquired Spot Images: 0 COMPARISON:  None. FINDINGS: Scout radiograph:  Unremarkable bowel gas pattern. Esophagus: No evidence of esophageal mass. A mild smooth stricture is seen involving the distal thoracic esophagus, which resulted in retention of a 13 mm barium tablet at this site. This tablet did subsequently passed with additional swallows of water. No findings of esophagitis are seen. Moderate esophageal dysmotility is seen, with break up of primary peristalsis and intermittent tertiary contractions in the lower thoracic esophagus. Mild gastroesophageal reflux of contrast is seen to the level of the midthoracic esophagus. Stomach: A moderate sliding hiatal hernia is seen. Otherwise normal appearance of the stomach. No evidence of gastric mass or ulcer. Duodenum: Normal appearance of duodenal bulb and sweep. Normal position of ligament of Treitz. No ulcer, stricture, or other significant abnormality seen. Other:  None. IMPRESSION: Moderate sliding hiatal hernia. Mild gastroesophageal reflux to the level of the midthoracic esophagus. Mild smooth stricture of the distal thoracic esophagus, which resulted in retention of a 13 mm barium tablet at this site. Moderate esophageal dysmotility. Electronically Signed   By: Marlaine Hind M.D.   On: 07/19/2021 09:05    ASSESSMENT & PLAN:   65 yo with   1) Microcytic ANemia due to severe iron deficiency 2) Iron deficiency likely related to GI losses -- possibly related to large hiatal hernia with blood loss -no evidence of pernicious anemia with neg parietal cell and IF antibodies. PLAN: -labs today -07/15/2021 Labs done today show normal hemoglobin of 12.6 up from 10.3.  Normal platelets and WBC count. CMP with some elevation of AST and ALT and  hyperglycemia. Ferritin 363 with an iron saturation of 24% B12 level within normal limits at 406, RBC folate within normal limits.  -No indication for additional IV iron at this time -f/u  with PCP for stool occult blood testing -recommended patient call and f/u with her gastroenterologist for GI workup  . No orders of the defined types were placed in this encounter.   FOLLOW UP: RTC with Dr Irene Limbo with labs in 4 months   All of the patients questions were answered with apparent satisfaction. The patient knows to call the clinic with any problems, questions or concerns. . The total time spent in the appointment was 20 minutes and more than 50% was on counseling and direct patient cares.  Sullivan Lone MD [place] AAHIVMS [facility] Hematology/Oncology Physician [facility]

## 2021-07-25 ENCOUNTER — Other Ambulatory Visit: Payer: Self-pay

## 2021-07-25 ENCOUNTER — Encounter: Payer: Self-pay | Admitting: Physician Assistant

## 2021-07-25 ENCOUNTER — Other Ambulatory Visit (HOSPITAL_COMMUNITY): Payer: Self-pay

## 2021-07-25 DIAGNOSIS — K449 Diaphragmatic hernia without obstruction or gangrene: Secondary | ICD-10-CM

## 2021-07-26 ENCOUNTER — Other Ambulatory Visit (HOSPITAL_COMMUNITY): Payer: Self-pay

## 2021-08-01 ENCOUNTER — Other Ambulatory Visit (HOSPITAL_BASED_OUTPATIENT_CLINIC_OR_DEPARTMENT_OTHER): Payer: Self-pay

## 2021-08-03 ENCOUNTER — Other Ambulatory Visit (HOSPITAL_COMMUNITY): Payer: Self-pay

## 2021-08-04 ENCOUNTER — Ambulatory Visit: Payer: Self-pay | Admitting: General Surgery

## 2021-08-04 NOTE — H&P (Signed)
vSubjective Chief Complaint: No chief complaint on file.     History of Present Illness: Robin Greene is a 65 y.o. female who is seen today as an office consultation at the request of Dr. Cristina Gong for evaluation of No chief complaint on file.  .     Patient is a 65 year old female who comes in secondary to a hiatal hernia.  Patient has been worked up by Dr. Johnney Killian.  Patient underwent upper GI which revealed a large hiatal hernia.  Patient also underwent endoscopy which revealed a 5 cm hiatal hernia.   I did review the images personally.  Patient did have a history of iron deficiency.  Patient had no signs of Cameron ulcers.   Patient has had anemia, has had multiple iron transfusions.  The thought is that she probably has Cameron ulcers secondary to sliding hiatal hernia.     She has a previous umbilical hernia repair with mesh by Dr. Clarise Cruz in 2018. Review of Systems: A complete review of systems was obtained from the patient.  I have reviewed this information and discussed as appropriate with the patient.  See HPI as well for other ROS.   Review of Systems  Constitutional: Negative for fever.  HENT: Negative for congestion.   Eyes: Negative for blurred vision.  Respiratory: Negative for cough, shortness of breath and wheezing.   Cardiovascular: Negative for chest pain and palpitations.  Gastrointestinal: Negative for heartburn.  Genitourinary: Negative for dysuria.  Musculoskeletal: Negative for myalgias.  Skin: Negative for rash.  Neurological: Negative for dizziness and headaches.  Psychiatric/Behavioral: Negative for depression and suicidal ideas.  All other systems reviewed and are negative.     Medical History: Past Medical History No past medical history on file.    There is no problem list on file for this patient.     Allergies Not on File    No current outpatient medications on file prior to visit.   No current facility-administered medications on file  prior to visit.     Family History No family history on file.     Social History   Tobacco Use Smoking Status Not on file Smokeless Tobacco Not on file     Social History Social History    Socioeconomic History  Marital status: Married      Objective: There were no vitals filed for this visit.  There is no height or weight on file to calculate BMI.   Physical Exam Constitutional:      General: She is not in acute distress.    Appearance: Normal appearance.  HENT:     Head: Normocephalic.     Nose: No rhinorrhea.     Mouth/Throat:     Mouth: Mucous membranes are moist.     Pharynx: Oropharynx is clear.  Eyes:     General: No scleral icterus.    Pupils: Pupils are equal, round, and reactive to light.  Cardiovascular:     Rate and Rhythm: Normal rate.     Pulses: Normal pulses.  Pulmonary:     Effort: Pulmonary effort is normal. No respiratory distress.     Breath sounds: No stridor. No wheezing.  Abdominal:     General: Abdomen is flat. There is no distension.     Tenderness: There is no abdominal tenderness. There is no guarding or rebound.  Musculoskeletal:        General: Normal range of motion.     Cervical back: Normal range of motion and neck supple.  Skin:    General: Skin is warm and dry.     Capillary Refill: Capillary refill takes less than 2 seconds.     Coloration: Skin is not jaundiced.  Neurological:     General: No focal deficit present.     Mental Status: She is alert and oriented to person, place, and time. Mental status is at baseline.  Psychiatric:        Mood and Affect: Mood normal.        Thought Content: Thought content normal.        Judgment: Judgment normal.        Assessment and Plan: Diagnoses and all orders for this visit:   Hiatal hernia       1.  Patient was an excellent candidate for robotic hiatal hernia pair with fundoplication. 2.  I discussed with the patient the risk and benefits of the procedure to include  but not limited to: Infection, bleeding, damage to surrounding structures, possible need for further surgery, possible pneumothorax, possible recurrence.  Patient voiced understanding and wished to proceed.   No follow-ups on file.   Ralene Ok, MD

## 2021-08-05 ENCOUNTER — Ambulatory Visit
Admission: RE | Admit: 2021-08-05 | Discharge: 2021-08-05 | Disposition: A | Discharge status: Home / Self Care | Payer: No Typology Code available for payment source | Source: Ambulatory Visit | Attending: Obstetrics and Gynecology | Admitting: Obstetrics and Gynecology

## 2021-08-05 ENCOUNTER — Other Ambulatory Visit: Payer: Self-pay

## 2021-08-05 DIAGNOSIS — Z1231 Encounter for screening mammogram for malignant neoplasm of breast: Secondary | ICD-10-CM

## 2021-08-08 ENCOUNTER — Other Ambulatory Visit (HOSPITAL_COMMUNITY): Payer: Self-pay

## 2021-08-10 ENCOUNTER — Encounter: Payer: Self-pay | Admitting: Physician Assistant

## 2021-08-11 ENCOUNTER — Other Ambulatory Visit (HOSPITAL_COMMUNITY): Payer: Self-pay

## 2021-08-12 ENCOUNTER — Encounter: Payer: Self-pay | Admitting: Nurse Practitioner

## 2021-08-12 ENCOUNTER — Ambulatory Visit (INDEPENDENT_AMBULATORY_CARE_PROVIDER_SITE_OTHER): Payer: No Typology Code available for payment source | Admitting: Nurse Practitioner

## 2021-08-12 ENCOUNTER — Other Ambulatory Visit (HOSPITAL_COMMUNITY): Payer: Self-pay

## 2021-08-12 ENCOUNTER — Other Ambulatory Visit: Payer: Self-pay

## 2021-08-12 VITALS — BP 126/78 | HR 78 | Temp 97.8°F | Ht 65.0 in | Wt 176.9 lb

## 2021-08-12 DIAGNOSIS — E1169 Type 2 diabetes mellitus with other specified complication: Secondary | ICD-10-CM

## 2021-08-12 DIAGNOSIS — E1159 Type 2 diabetes mellitus with other circulatory complications: Secondary | ICD-10-CM | POA: Diagnosis not present

## 2021-08-12 DIAGNOSIS — F4323 Adjustment disorder with mixed anxiety and depressed mood: Secondary | ICD-10-CM

## 2021-08-12 DIAGNOSIS — I152 Hypertension secondary to endocrine disorders: Secondary | ICD-10-CM

## 2021-08-12 LAB — POCT GLYCOSYLATED HEMOGLOBIN (HGB A1C): Hemoglobin A1C: 8 % — AB (ref 4.0–5.6)

## 2021-08-12 MED ORDER — SEMAGLUTIDE (2 MG/DOSE) 8 MG/3ML ~~LOC~~ SOPN
2.0000 mg | PEN_INJECTOR | SUBCUTANEOUS | 1 refills | Status: DC
  Filled 2021-08-12: qty 3, 28d supply, fill #0
  Filled 2021-10-06: qty 3, 28d supply, fill #1
  Filled 2021-11-07: qty 9, 84d supply, fill #2

## 2021-08-12 MED ORDER — FLUOXETINE HCL 40 MG PO CAPS
40.0000 mg | ORAL_CAPSULE | Freq: Every day | ORAL | 3 refills | Status: DC
Start: 2021-08-12 — End: 2022-08-24
  Filled 2021-08-12: qty 90, 90d supply, fill #0
  Filled 2021-11-22: qty 90, 90d supply, fill #1
  Filled 2022-02-27: qty 90, 90d supply, fill #2
  Filled 2022-05-26: qty 90, 90d supply, fill #3

## 2021-08-12 NOTE — Progress Notes (Signed)
Established Patient Office Visit  Subjective:  Patient ID: Robin Greene, female    DOB: 1956-03-26  Age: 65 y.o. MRN: [id number]  CC:  Chief Complaint  Patient presents with   Hypertension    HPI Robin Greene presents for routine follow up visit. Blood sugars are slightly improved since her last visit. Her HgbA1c is 8.0 today, down from 8.3 at last visit. Blood pressure lightly elevated at first check. Returned to normal levels as visit progressed.  She states she has been struggling with slightly increased levels of anxiety depression.  In general, doing well with fluoxetine 20 mg daily.  Denies feelings of suicidality.  Does not want to hurt herself anyone else.  She has no physical concerns or complaints at this time.  She denies chest pain, chest pressure, or shortness of breath. She denies headaches or visual disturbances. She denies abdominal pain, nausea, vomiting, or changes in bowel or bladder habits.    Past Medical History:  Diagnosis Date   Anemia    Arthritis    Complication of anesthesia    Diabetes mellitus    dx 2010   GERD (gastroesophageal reflux disease)    occasional   will take OTC   HSV-1 infection    Hypertension    PONV (postoperative nausea and vomiting)    Vitamin D deficiency     Past Surgical History:  Procedure Laterality Date   ABDOMINAL HYSTERECTOMY     EYE SURGERY     INSERTION OF MESH N/A 04/30/2017   Procedure: INSERTION OF MESH;  Surgeon: Jackolyn Confer, MD;  Location: [facility];  Service: General;  Laterality: N/A;   OVARY SURGERY     REFRACTIVE SURGERY     UMBILICAL HERNIA REPAIR N/A 04/30/2017   Procedure: UMBILICAL HERNIA REPAIR WITH MESH;  Surgeon: Jackolyn Confer, MD;  Location: [facility];  Service: General;  Laterality: N/A;    Family History  Problem Relation Age of Onset   Sjogren's syndrome Mother    Hypertension Father    Hyperlipidemia Father    Healthy Sister     Social History   Socioeconomic History   Marital  status: Married    Spouse name: Not on file   Number of children: Not on file   Years of education: Not on file   Highest education level: Not on file  Occupational History   Not on file  Tobacco Use   Smoking status: Never   Smokeless tobacco: Never  Substance and Sexual Activity   Alcohol use: Yes    Comment: occasionally   Drug use: No   Sexual activity: Yes    Birth control/protection: Surgical  Other Topics Concern   Not on file  Social History Narrative   Not on file   Social Determinants of Health   Financial Resource Strain: Not on file  Food Insecurity: Not on file  Transportation Needs: Not on file  Physical Activity: Not on file  Stress: Not on file  Social Connections: Not on file  Intimate Partner Violence: Not on file    Outpatient Medications Prior to Visit  Medication Sig Dispense Refill   blood glucose meter kit and supplies Dispense based on patient and insurance preference. Use three to four times weekly as directed. (FOR ICD-10 E10.9, E11.9). 1 each 0   ezetimibe (ZETIA) 10 MG tablet TAKE 1 TABLET BY MOUTH ONCE DAILY 90 tablet 0   glipiZIDE (GLUCOTROL XL) 5 MG 24 hr tablet TAKE 1 TABLET BY MOUTH ONCE  A DAY WITH BREAKFAST 90 tablet 1   glucose blood (FREESTYLE LITE) test strip USE TO CHECK BLOOD SUGAR 3 TO 4 TIMES A WEEK AS DIRECTED 100 strip 1   hydrochlorothiazide (HYDRODIURIL) 25 MG tablet Take 1 tablet by mouth daily. 90 tablet 0   Lancets (FREESTYLE) lancets USE TO CHECK BLOOD SUGAR 3 TO 4 TIMES A WEEK AS DIRECTED 100 each 1   liothyronine (CYTOMEL) 5 MCG tablet Take 2 tablets (10 mcg total) by mouth in the morning AND 1 tablet (5 mcg total) daily in the afternoon. 270 tablet 0   lisinopril (ZESTRIL) 40 MG tablet TAKE 1 TABLET BY MOUTH ONCE DAILY 90 tablet 0   metFORMIN (GLUCOPHAGE) 1000 MG tablet TAKE 1 TABLET BY MOUTH 2 TIMES DAILY WITH A MEAL 180 tablet 0   metroNIDAZOLE (METROGEL) 1 % gel Apply 1 application topically daily as needed (rosacea).      polyethylene glycol-electrolytes (NULYTELY) 420 g solution Use as directed 4000 mL 0   SYNTHROID 100 MCG tablet TAKE 1 TABLET BY MOUTH DAILY BEFORE BREAKFAST 90 tablet 0   valACYclovir (VALTREX) 1000 MG tablet Take 500 mg by mouth 2 (two) times daily as needed (fever blisters). 1/2 tablet twice daily for 5 days as needed for flares     FLUoxetine (PROZAC) 20 MG tablet Take 1 and 1/2 tablets (30 mg total) by mouth daily. 135 tablet 1   Semaglutide, 1 MG/DOSE, (OZEMPIC, 1 MG/DOSE,) 4 MG/3ML SOPN INJECT 1 MG INTO THE SKIN ONCE A WEEK. 6 mL 1   sitaGLIPtin (JANUVIA) 100 MG tablet TAKE 1 TABLET BY MOUTH DAILY. 90 tablet 1   liothyronine (CYTOMEL) 5 MCG tablet Take 2 tablets in the morning and 1 tablet in the afternoon. (Patient not taking: Reported on 08/12/2021) 270 tablet 0   No facility-administered medications prior to visit.    Allergies  Allergen Reactions   Asa [Aspirin] Anaphylaxis   Crestor [Rosuvastatin Calcium] Other (See Comments)    Abnormal LFTs   Claritin [Loratadine] Other (See Comments)    Fainted twice    Invokana [Canagliflozin] Other (See Comments)    Genital yeast infections   Jardiance [Empagliflozin] Other (See Comments)    Genital yeast infection   Lipitor [Atorvastatin] Other (See Comments)    Muscle aches    Simvastatin Other (See Comments)    Muscle aches    ROS Review of Systems  Constitutional:  Positive for fatigue. Negative for activity change, appetite change, chills and fever.  HENT:  Negative for congestion, postnasal drip, rhinorrhea, sinus pressure, sinus pain, sneezing and sore throat.   Eyes: Negative.   Respiratory:  Negative for cough, chest tightness, shortness of breath and wheezing.   Cardiovascular:  Negative for chest pain and palpitations.  Gastrointestinal:  Negative for abdominal pain, constipation, diarrhea, nausea and vomiting.  Endocrine: Negative for cold intolerance, heat intolerance, polydipsia and polyuria.       Mildly  improved blood sugars since most recent visit.  Genitourinary:  Negative for dyspareunia, dysuria, flank pain, frequency and urgency.  Musculoskeletal:  Negative for arthralgias, back pain and myalgias.  Skin:  Negative for rash.  Allergic/Immunologic: Negative for environmental allergies.  Neurological:  Negative for dizziness, weakness and headaches.  Hematological:  Negative for adenopathy.  Psychiatric/Behavioral:  Positive for dysphoric mood. The patient is nervous/anxious.      Objective:    Physical Exam Vitals and nursing note reviewed.  Constitutional:      Appearance: Normal appearance. She is well-developed.  HENT:  Head: Normocephalic and atraumatic.     Nose: Nose normal.     Mouth/Throat:     Mouth: Mucous membranes are moist.     Pharynx: Oropharynx is clear.  Eyes:     Extraocular Movements: Extraocular movements intact.     Conjunctiva/sclera: Conjunctivae normal.     Pupils: Pupils are equal, round, and reactive to light.  Cardiovascular:     Rate and Rhythm: Normal rate and regular rhythm.     Pulses: Normal pulses.     Heart sounds: Normal heart sounds.  Pulmonary:     Effort: Pulmonary effort is normal.     Breath sounds: Normal breath sounds.  Abdominal:     Palpations: Abdomen is soft.  Musculoskeletal:        General: Normal range of motion.     Cervical back: Normal range of motion and neck supple.  Lymphadenopathy:     Cervical: No cervical adenopathy.  Skin:    General: Skin is warm and dry.     Capillary Refill: Capillary refill takes less than 2 seconds.  Neurological:     General: No focal deficit present.     Mental Status: She is alert and oriented to person, place, and time.  Psychiatric:        Mood and Affect: Mood normal.        Behavior: Behavior normal.        Thought Content: Thought content normal.        Judgment: Judgment normal.    Today's Vitals   08/12/21 0923 08/12/21 0951  BP: (!) 163/79 126/78  Pulse: 78    Temp: 97.8 F (36.6 C)   SpO2: 98%   Weight: 176 lb 14.4 oz (80.2 kg)   Height: '5\' 5"'  (1.651 m)    Body mass index is 29.44 kg/m.   Wt Readings from Last 3 Encounters:  08/12/21 176 lb 14.4 oz (80.2 kg)  07/15/21 176 lb 9.6 oz (80.1 kg)  05/18/21 173 lb 12.8 oz (78.8 kg)     Health Maintenance Due  Topic Date Due   COVID-19 Vaccine (1) Never done   OPHTHALMOLOGY EXAM  Never done   Pneumonia Vaccine 51+ Years old (3 - PPSV23 if available, else PCV20) 09/19/2018   DEXA SCAN  Never done   COLONOSCOPY (Pts 45-23yr Insurance coverage will need to be confirmed)  12/29/2019    There are no preventive care reminders to display for this patient.  Lab Results  Component Value Date   TSH 3.930 06/30/2021   Lab Results  Component Value Date   WBC 5.4 07/15/2021   HGB 12.6 07/15/2021   HCT 40.4 07/15/2021   HCT 37.8 07/15/2021   MCV 82.2 07/15/2021   PLT 284 07/15/2021   Lab Results  Component Value Date   NA 138 07/15/2021   K 4.3 07/15/2021   CO2 26 07/15/2021   GLUCOSE 239 (H) 07/15/2021   BUN 10 07/15/2021   CREATININE 0.79 07/15/2021   BILITOT 0.4 07/15/2021   ALKPHOS 84 07/15/2021   AST 45 (H) 07/15/2021   ALT 51 (H) 07/15/2021   PROT 7.0 07/15/2021   ALBUMIN 3.9 07/15/2021   CALCIUM 9.7 07/15/2021   ANIONGAP 13 07/15/2021   EGFR 95 05/18/2021   Lab Results  Component Value Date   CHOL 176 05/18/2021   Lab Results  Component Value Date   HDL 51 05/18/2021   Lab Results  Component Value Date   LDLCALC 90 05/18/2021   Lab Results  Component Value Date   TRIG 205 (H) 05/18/2021   Lab Results  Component Value Date   CHOLHDL 3.5 05/18/2021   Lab Results  Component Value Date   HGBA1C 8.0 (A) 08/12/2021      Assessment & Plan:  1. Type 2 diabetes mellitus with other specified complication, unspecified whether long term insulin use ([place]) Blood sugar slightly improved from last visit.  Hemoglobin 8.0 down from 8.3 at last check.  Increase dose  of Ozempic to 2 mg weekly.  Continue other diabetic medications as prescribed.  Advised patient to check blood sugars at least once daily, prior to eating.  The goal is to have blood sugars between 70 and 120.  Recheck hemoglobin A1c at next visit. - POCT glycosylated hemoglobin (Hb A1C) - Semaglutide, 2 MG/DOSE, 8 MG/3ML SOPN; Inject 2 mg as directed once a week.  Dispense: 15 mL; Refill: 1  2. Hypertension associated with diabetes ([place])-  with superimposed white coat HTN Stable.  Continue blood pressure medications as prescribed.  3. Adjustment disorder with mixed anxiety and depressed mood Increase fluoxetine to 40 mg daily.  New prescription sent to her pharmacy today.  Discussed coping and stress management techniques.  Reassess at next visit. - FLUoxetine (PROZAC) 40 MG capsule; Take 1 capsule (40 mg total) by mouth daily.  Dispense: 90 capsule; Refill: 3   Problem List Items Addressed This Visit       Cardiovascular and Mediastinum   Hypertension associated with diabetes ([place])-  with superimposed white coat HTN (Chronic)   Relevant Medications   Semaglutide, 2 MG/DOSE, 8 MG/3ML SOPN     Endocrine   Type 2 diabetes mellitus with other specified complication (H[facility]) - Primary   Relevant Medications   Semaglutide, 2 MG/DOSE, 8 MG/3ML SOPN   Other Relevant Orders   POCT glycosylated hemoglobin (Hb A1C) (Completed)     Other   Adjustment disorder with mixed anxiety and depressed mood   Relevant Medications   FLUoxetine (PROZAC) 40 MG capsule    Meds ordered this encounter  Medications   FLUoxetine (PROZAC) 40 MG capsule    Sig: Take 1 capsule (40 mg total) by mouth daily.    Dispense:  90 capsule    Refill:  3    Please note change in dosing. Ok to fill as tablet if insurance prefers.    Order Specific Question:   Supervising Provider    Answer:   Hali Marry [[id number]]   Semaglutide, 2 MG/DOSE, 8 MG/3ML SOPN    Sig: Inject 2 mg as directed once a week.    Dispense:   15 mL    Refill:  1    Please note change in dosing    Order Specific Question:   Supervising Provider    Answer:   Beatrice Lecher D [[id number]]     Follow-up: Return in about 3 months (around 11/12/2021) for blood pressure, health maintenance exam, check HgbA1c.    Ronnell Freshwater, NP  This note was dictated using Systems analyst. Rapid proofreading was performed to expedite the delivery of the information. Despite proofreading, phonetic errors will occur which are common with this voice recognition software. Please take this into consideration. If there are any concerns, please contact our office.

## 2021-08-15 ENCOUNTER — Other Ambulatory Visit: Payer: Self-pay | Admitting: Physician Assistant

## 2021-08-15 DIAGNOSIS — E1169 Type 2 diabetes mellitus with other specified complication: Secondary | ICD-10-CM

## 2021-08-16 ENCOUNTER — Other Ambulatory Visit (HOSPITAL_COMMUNITY): Payer: Self-pay

## 2021-08-16 MED ORDER — SITAGLIPTIN PHOSPHATE 100 MG PO TABS
ORAL_TABLET | Freq: Every day | ORAL | 1 refills | Status: DC
  Filled 2021-08-16: qty 90, 90d supply, fill #0
  Filled 2021-11-22: qty 30, 30d supply, fill #1
  Filled 2021-12-25: qty 30, 30d supply, fill #2
  Filled 2022-02-02: qty 30, 30d supply, fill #3

## 2021-08-22 ENCOUNTER — Other Ambulatory Visit: Payer: Self-pay | Admitting: Physician Assistant

## 2021-08-22 ENCOUNTER — Other Ambulatory Visit (HOSPITAL_COMMUNITY): Payer: Self-pay

## 2021-08-22 DIAGNOSIS — E1169 Type 2 diabetes mellitus with other specified complication: Secondary | ICD-10-CM

## 2021-08-22 MED ORDER — GLIPIZIDE ER 5 MG PO TB24
ORAL_TABLET | Freq: Every day | ORAL | 1 refills | Status: DC
  Filled 2021-08-22: qty 90, 90d supply, fill #0

## 2021-08-23 ENCOUNTER — Other Ambulatory Visit (HOSPITAL_COMMUNITY): Payer: Self-pay

## 2021-09-02 NOTE — Progress Notes (Signed)
Surgical Instructions    Your procedure is scheduled on 09/13/21.  Report to [facility] Main Entrance "A" at 10:30 A.M., then check in with the Admitting office.  Call this number if you have problems the morning of surgery:  [phone]   If you have any questions prior to your surgery date call [phone]: Open Monday-Friday 8am-4pm    Remember:  Do not eat after midnight the night before your surgery  You may drink clear liquids until 9:30am the morning of your surgery.   Clear liquids allowed are: Water, Non-Citrus Juices (without pulp), Carbonated Beverages, Clear Tea, Black Coffee ONLY (NO MILK, CREAM OR POWDERED CREAMER of any kind), and Gatorade  Patient Instructions  The night before surgery:  No food after midnight. ONLY clear liquids after midnight   The day of surgery (if you have diabetes): Drink ONE (1) 12 oz G2 given to you in your pre admission testing appointment by 9:30am the morning of surgery. Drink in one sitting. Do not sip.  This drink was given to you during your hospital  pre-op appointment visit.  Nothing else to drink after completing the  12 oz bottle of G2.         If you have questions, please contact your surgeon's office.     Take these medicines the morning of surgery with A SIP OF WATER  ezetimibe (ZETIA) FLUoxetine (PROZAC) liothyronine (CYTOMEL) SYNTHROID   IF NEEDED: fluticasone (FLONASE)  As of today, STOP taking any Aspirin (unless otherwise instructed by your surgeon) Aleve, Naproxen, Ibuprofen, Motrin, Advil, Goody's, BC's, all herbal medications, fish oil, and all vitamins.  WHAT DO I DO ABOUT MY DIABETES MEDICATION?   Do not take oral diabetes medicines (pills) the morning of surgery.  THE MORNING OF SURGERY, do not take glipiZIDE (GLUCOTROL XL), metFORMIN (GLUCOPHAGE), Semaglutide or sitaGLIPtin (JANUVIA).  The day of surgery, do not take other diabetes injectables, including Byetta (exenatide), Bydureon (exenatide ER),  Victoza (liraglutide), or Trulicity (dulaglutide).  If your CBG is greater than 220 mg/dL, you may take  of your sliding scale (correction) dose of insulin.   HOW TO MANAGE YOUR DIABETES BEFORE AND AFTER SURGERY  Why is it important to control my blood sugar before and after surgery? Improving blood sugar levels before and after surgery helps healing and can limit problems. A way of improving blood sugar control is eating a healthy diet by:  Eating less sugar and carbohydrates  Increasing activity/exercise  Talking with your doctor about reaching your blood sugar goals High blood sugars (greater than 180 mg/dL) can raise your risk of infections and slow your recovery, so you will need to focus on controlling your diabetes during the weeks before surgery. Make sure that the doctor who takes care of your diabetes knows about your planned surgery including the date and location.  How do I manage my blood sugar before surgery? Check your blood sugar at least 4 times a day, starting 2 days before surgery, to make sure that the level is not too high or low.  Check your blood sugar the morning of your surgery when you wake up and every 2 hours until you get to the Short Stay unit.  If your blood sugar is less than 70 mg/dL, you will need to treat for low blood sugar: Do not take insulin. Treat a low blood sugar (less than 70 mg/dL) with  cup of clear juice (cranberry or apple), 4 glucose tablets, OR glucose gel. Recheck blood sugar in 15  minutes after treatment (to make sure it is greater than 70 mg/dL). If your blood sugar is not greater than 70 mg/dL on recheck, call [phone] for further instructions. Report your blood sugar to the short stay nurse when you get to Short Stay.  If you are admitted to the hospital after surgery: Your blood sugar will be checked by the staff and you will probably be given insulin after surgery (instead of oral diabetes medicines) to make sure you have good  blood sugar levels. The goal for blood sugar control after surgery is 80-180 mg/dL.    After your COVID test   You are not required to quarantine however you are required to wear a well-fitting mask when you are out and around people not in your household.  If your mask becomes wet or soiled, replace with a new one.  Wash your hands often with soap and water for 20 seconds or clean your hands with an alcohol-based hand sanitizer that contains at least 60% alcohol.  Do not share personal items.  Notify your provider: if you are in close contact with someone who has COVID  or if you develop a fever of 100.4 or greater, sneezing, cough, sore throat, shortness of breath or body aches.             Do not wear jewelry or makeup Do not wear lotions, powders, perfumes/colognes, or deodorant. Do not shave 48 hours prior to surgery.  Men may shave face and neck. Do not bring valuables to the hospital. DO Not wear nail polish, gel polish, artificial nails, or any other type of covering on natural nails including finger and toenails. If patients have artificial nails, gel coating, etc. that need to be removed by a nail salon, please have this removed prior to surgery or surgery may need to be canceled/delayed if the surgeon/ anesthesia feels like the patient is unable to be adequately monitored.             [facility] is not responsible for any belongings or valuables.  Do NOT Smoke (Tobacco/Vaping)  24 hours prior to your procedure  If you use a CPAP at night, you may bring your mask for your overnight stay.   Contacts, glasses, hearing aids, dentures or partials may not be worn into surgery, please bring cases for these belongings   For patients admitted to the hospital, discharge time will be determined by your treatment team.   Patients discharged the day of surgery will not be allowed to drive home, and someone needs to stay with them for 24 hours.  NO VISITORS WILL BE ALLOWED IN  PRE-OP WHERE PATIENTS ARE PREPPED FOR SURGERY.  ONLY 1 SUPPORT PERSON MAY BE PRESENT IN THE WAITING ROOM WHILE YOU ARE IN SURGERY.  IF YOU ARE TO BE ADMITTED, ONCE YOU ARE IN YOUR ROOM YOU WILL BE ALLOWED TWO (2) VISITORS. 1 (ONE) VISITOR MAY STAY OVERNIGHT BUT MUST ARRIVE TO THE ROOM BY 8pm.  Minor children may have two parents present. Special consideration for safety and communication needs will be reviewed on a case by case basis.  Special instructions:    Oral Hygiene is also important to reduce your risk of infection.  Remember - BRUSH YOUR TEETH THE MORNING OF SURGERY WITH YOUR REGULAR TOOTHPASTE   [facility]- Preparing For Surgery  Before surgery, you can play an important role. Because skin is not sterile, your skin needs to be as free of germs as possible. You can reduce  the number of germs on your skin by washing with CHG (chlorahexidine gluconate) Soap before surgery.  CHG is an antiseptic cleaner which kills germs and bonds with the skin to continue killing germs even after washing.     Please do not use if you have an allergy to CHG or antibacterial soaps. If your skin becomes reddened/irritated stop using the CHG.  Do not shave (including legs and underarms) for at least 48 hours prior to first CHG shower. It is OK to shave your face.  Please follow these instructions carefully.     Shower the NIGHT BEFORE SURGERY and the MORNING OF SURGERY with CHG Soap.   If you chose to wash your hair, wash your hair first as usual with your normal shampoo. After you shampoo, rinse your hair and body thoroughly to remove the shampoo.  Then [employer] and genitals (private parts) with your normal soap and rinse thoroughly to remove soap.  After that Use CHG Soap as you would any other liquid soap. You can apply CHG directly to the skin and wash gently with a scrungie or a clean washcloth.   Apply the CHG Soap to your body ONLY FROM THE NECK DOWN.  Do not use on open wounds or open sores. Avoid  contact with your eyes, ears, mouth and genitals (private parts). Wash Face and genitals (private parts)  with your normal soap.   Wash thoroughly, paying special attention to the area where your surgery will be performed.  Thoroughly rinse your body with warm water from the neck down.  DO NOT shower/wash with your normal soap after using and rinsing off the CHG Soap.  Pat yourself dry with a CLEAN TOWEL.  Wear CLEAN PAJAMAS to bed the night before surgery  Place CLEAN SHEETS on your bed the night before your surgery  DO NOT SLEEP WITH PETS.   Day of Surgery: Take a shower with CHG soap. Wear Clean/Comfortable clothing the morning of surgery Do not apply any deodorants/lotions.   Remember to brush your teeth WITH YOUR REGULAR TOOTHPASTE.   Please read over the following fact sheets that you were given.

## 2021-09-05 ENCOUNTER — Encounter (HOSPITAL_COMMUNITY)
Admission: RE | Admit: 2021-09-05 | Discharge: 2021-09-05 | Disposition: A | Discharge status: Home / Self Care | Payer: No Typology Code available for payment source | Source: Ambulatory Visit | Attending: General Surgery | Admitting: General Surgery

## 2021-09-05 ENCOUNTER — Encounter (HOSPITAL_COMMUNITY): Payer: Self-pay

## 2021-09-05 ENCOUNTER — Other Ambulatory Visit: Payer: Self-pay

## 2021-09-05 VITALS — BP 159/77 | HR 80 | Temp 98.4°F | Resp 18 | Ht 65.0 in | Wt 178.2 lb

## 2021-09-05 DIAGNOSIS — Z01818 Encounter for other preprocedural examination: Secondary | ICD-10-CM | POA: Diagnosis not present

## 2021-09-05 DIAGNOSIS — I10 Essential (primary) hypertension: Secondary | ICD-10-CM | POA: Diagnosis not present

## 2021-09-05 DIAGNOSIS — E1159 Type 2 diabetes mellitus with other circulatory complications: Secondary | ICD-10-CM | POA: Insufficient documentation

## 2021-09-05 LAB — CBC
HCT: 39.5 % (ref 36.0–46.0)
Hemoglobin: 13 g/dL (ref 12.0–15.0)
MCH: 29 pg (ref 26.0–34.0)
MCHC: 32.9 g/dL (ref 30.0–36.0)
MCV: 88 fL (ref 80.0–100.0)
Platelets: 340 10*3/uL (ref 150–400)
RBC: 4.49 MIL/uL (ref 3.87–5.11)
RDW: 13.3 % (ref 11.5–15.5)
WBC: 8 10*3/uL (ref 4.0–10.5)
nRBC: 0 % (ref 0.0–0.2)

## 2021-09-05 LAB — BASIC METABOLIC PANEL
Anion gap: 9 (ref 5–15)
BUN: 10 mg/dL (ref 8–23)
CO2: 26 mmol/L (ref 22–32)
Calcium: 9.6 mg/dL (ref 8.9–10.3)
Chloride: 99 mmol/L (ref 98–111)
Creatinine, Ser: 0.7 mg/dL (ref 0.44–1.00)
GFR, Estimated: >60 mL/min (ref >60)
Glucose, Bld: 167 mg/dL — ABNORMAL HIGH (ref 70–99)
Potassium: 4.1 mmol/L (ref 3.5–5.1)
Sodium: 134 mmol/L — ABNORMAL LOW (ref 135–145)

## 2021-09-05 LAB — GLUCOSE, CAPILLARY: Glucose-Capillary: 218 mg/dL — ABNORMAL HIGH (ref 70–99)

## 2021-09-05 NOTE — Progress Notes (Signed)
PCP - Lorrene Reid, PA-C Cardiologist - denies  PPM/ICD - denies   Chest x-ray - 09/21/2004 EKG - 09/05/21 at PAT Stress Test - 12/02/2004 ECHO - denies Cardiac Cath - denies  Sleep Study - denies  DM- Type 2 Fasting Blood Sugar - 150's Checks Blood Sugar 2-3 times/week  Blood Thinner Instructions: n/a Aspirin Instructions: n/a  ERAS Protcol - yes PRE-SURGERY G2- given at PAT  COVID TEST- pt scheduled for testing on 09/12/21   Anesthesia review: no  Patient denies shortness of breath, fever, cough and chest pain at PAT appointment   All instructions explained to the patient, with a verbal understanding of the material. Patient agrees to go over the instructions while at home for a better understanding. Patient also instructed to wear a mask in public after being tested for COVID-19. The opportunity to ask questions was provided.

## 2021-09-12 ENCOUNTER — Other Ambulatory Visit (HOSPITAL_COMMUNITY)
Admission: RE | Admit: 2021-09-12 | Discharge: 2021-09-12 | Disposition: A | Discharge status: Home / Self Care | Payer: No Typology Code available for payment source | Source: Ambulatory Visit | Attending: General Surgery | Admitting: General Surgery

## 2021-09-12 DIAGNOSIS — Z01818 Encounter for other preprocedural examination: Secondary | ICD-10-CM

## 2021-09-12 DIAGNOSIS — Z20822 Contact with and (suspected) exposure to covid-19: Secondary | ICD-10-CM | POA: Insufficient documentation

## 2021-09-12 DIAGNOSIS — Z01812 Encounter for preprocedural laboratory examination: Secondary | ICD-10-CM | POA: Diagnosis not present

## 2021-09-12 LAB — SARS CORONAVIRUS 2 (TAT 6-24 HRS): SARS Coronavirus 2: NEGATIVE

## 2021-09-12 NOTE — H&P (Signed)
Chief Complaint: No chief complaint on file.     History of Present Illness: Robin Greene is a 65 y.o. female who is seen today as an office consultation at the request of Dr. Cristina Gong for evaluation of No chief complaint on file.  .     Patient is a 65 year old female who comes in secondary to a hiatal hernia.  Patient has been worked up by Dr. Johnney Killian.  Patient underwent upper GI which revealed a large hiatal hernia.  Patient also underwent endoscopy which revealed a 5 cm hiatal hernia.   I did review the images personally.  Patient did have a history of iron deficiency.  Patient had no signs of Cameron ulcers.   Patient has had anemia, has had multiple iron transfusions.  The thought is that she probably has Cameron ulcers secondary to sliding hiatal hernia.     She has a previous umbilical hernia repair with mesh by Dr. Clarise Cruz in 2018. Review of Systems: A complete review of systems was obtained from the patient.  I have reviewed this information and discussed as appropriate with the patient.  See HPI as well for other ROS.   Review of Systems  Constitutional: Negative for fever.  HENT: Negative for congestion.   Eyes: Negative for blurred vision.  Respiratory: Negative for cough, shortness of breath and wheezing.   Cardiovascular: Negative for chest pain and palpitations.  Gastrointestinal: Negative for heartburn.  Genitourinary: Negative for dysuria.  Musculoskeletal: Negative for myalgias.  Skin: Negative for rash.  Neurological: Negative for dizziness and headaches.  Psychiatric/Behavioral: Negative for depression and suicidal ideas.  All other systems reviewed and are negative.     Medical History: Past Medical History No past medical history on file.     There is no problem list on file for this patient.     Allergies Not on File     No current outpatient medications on file prior to visit.   No current facility-administered medications on file prior to  visit.     Family History No family history on file.     Social History   Tobacco Use Smoking Status           Not on file Smokeless Tobacco   Not on file     Social History Social History     Socioeconomic History            Marital status:  Married       Objective: There were no vitals filed for this visit.  There is no height or weight on file to calculate BMI.   Physical Exam Constitutional:      General: She is not in acute distress.    Appearance: Normal appearance.  HENT:     Head: Normocephalic.     Nose: No rhinorrhea.     Mouth/Throat:     Mouth: Mucous membranes are moist.     Pharynx: Oropharynx is clear.  Eyes:     General: No scleral icterus.    Pupils: Pupils are equal, round, and reactive to light.  Cardiovascular:     Rate and Rhythm: Normal rate.     Pulses: Normal pulses.  Pulmonary:     Effort: Pulmonary effort is normal. No respiratory distress.     Breath sounds: No stridor. No wheezing.  Abdominal:     General: Abdomen is flat. There is no distension.     Tenderness: There is no abdominal tenderness. There is no guarding or rebound.  Musculoskeletal:  General: Normal range of motion.     Cervical back: Normal range of motion and neck supple.  Skin:    General: Skin is warm and dry.     Capillary Refill: Capillary refill takes less than 2 seconds.     Coloration: Skin is not jaundiced.  Neurological:     General: No focal deficit present.     Mental Status: She is alert and oriented to person, place, and time. Mental status is at baseline.  Psychiatric:        Mood and Affect: Mood normal.        Thought Content: Thought content normal.        Judgment: Judgment normal.        Assessment and Plan: Diagnoses and all orders for this visit:   Hiatal hernia       1.  Patient was an excellent candidate for robotic hiatal hernia pair with fundoplication. 2.  I discussed with the patient the risk and benefits of the  procedure to include but not limited to: Infection, bleeding, damage to surrounding structures, possible need for further surgery, possible pneumothorax, possible recurrence.  Patient voiced understanding and wished to proceed.   No follow-ups on file.   Ralene Ok, MD

## 2021-09-13 ENCOUNTER — Ambulatory Visit (HOSPITAL_COMMUNITY): Payer: No Typology Code available for payment source | Admitting: Anesthesiology

## 2021-09-13 ENCOUNTER — Other Ambulatory Visit: Payer: Self-pay | Admitting: Physician Assistant

## 2021-09-13 ENCOUNTER — Other Ambulatory Visit (HOSPITAL_COMMUNITY): Payer: Self-pay

## 2021-09-13 ENCOUNTER — Other Ambulatory Visit: Payer: Self-pay

## 2021-09-13 ENCOUNTER — Encounter (HOSPITAL_COMMUNITY)
Admission: RE | Disposition: A | Discharge status: Home / Self Care | Payer: Self-pay | Source: Ambulatory Visit | Attending: General Surgery

## 2021-09-13 ENCOUNTER — Observation Stay (HOSPITAL_COMMUNITY)
Admission: RE | Admit: 2021-09-13 | Discharge: 2021-09-14 | Disposition: A | Discharge status: Home / Self Care | Payer: No Typology Code available for payment source | Source: Ambulatory Visit | Attending: General Surgery | Admitting: General Surgery

## 2021-09-13 ENCOUNTER — Encounter (HOSPITAL_COMMUNITY): Payer: Self-pay | Admitting: General Surgery

## 2021-09-13 DIAGNOSIS — E039 Hypothyroidism, unspecified: Secondary | ICD-10-CM | POA: Diagnosis not present

## 2021-09-13 DIAGNOSIS — E119 Type 2 diabetes mellitus without complications: Secondary | ICD-10-CM | POA: Insufficient documentation

## 2021-09-13 DIAGNOSIS — I10 Essential (primary) hypertension: Secondary | ICD-10-CM | POA: Insufficient documentation

## 2021-09-13 DIAGNOSIS — K449 Diaphragmatic hernia without obstruction or gangrene: Principal | ICD-10-CM

## 2021-09-13 DIAGNOSIS — Z8719 Personal history of other diseases of the digestive system: Secondary | ICD-10-CM

## 2021-09-13 HISTORY — PX: XI ROBOTIC ASSISTED HIATAL HERNIA REPAIR: SHX6889

## 2021-09-13 HISTORY — PX: INSERTION OF MESH: SHX5868

## 2021-09-13 LAB — GLUCOSE, CAPILLARY
Glucose-Capillary: 235 mg/dL — ABNORMAL HIGH (ref 70–99)
Glucose-Capillary: 143 mg/dL — ABNORMAL HIGH (ref 70–99)
Glucose-Capillary: 269 mg/dL — ABNORMAL HIGH (ref 70–99)
Glucose-Capillary: 243 mg/dL — ABNORMAL HIGH (ref 70–99)
Glucose-Capillary: 241 mg/dL — ABNORMAL HIGH (ref 70–99)

## 2021-09-13 SURGERY — REPAIR, HERNIA, HIATAL, ROBOT-ASSISTED
Anesthesia: General | Site: Abdomen

## 2021-09-13 MED ORDER — INSULIN ASPART 100 UNIT/ML IJ SOLN
0.0000 [IU] | Freq: Three times a day (TID) | INTRAMUSCULAR | Status: DC
Start: 2021-09-13 — End: 2021-09-15
  Administered 2021-09-13 – 2021-09-14 (×3): 5 [IU] via SUBCUTANEOUS

## 2021-09-13 MED ORDER — FENTANYL CITRATE (PF) 100 MCG/2ML IJ SOLN
25.0000 ug | INTRAMUSCULAR | Status: DC | PRN
  Administered 2021-09-13 (×3): 25 ug via INTRAVENOUS

## 2021-09-13 MED ORDER — PHENYLEPHRINE 40 MCG/ML (10ML) SYRINGE FOR IV PUSH (FOR BLOOD PRESSURE SUPPORT)
PREFILLED_SYRINGE | INTRAVENOUS | Status: DC | PRN
  Administered 2021-09-13: 40 ug via INTRAVENOUS
  Administered 2021-09-13: 120 ug via INTRAVENOUS

## 2021-09-13 MED ORDER — METFORMIN HCL 1000 MG PO TABS
ORAL_TABLET | Freq: Two times a day (BID) | ORAL | 0 refills | Status: DC
  Filled 2021-09-13: qty 180, 90d supply, fill #0

## 2021-09-13 MED ORDER — KETOROLAC TROMETHAMINE 30 MG/ML IJ SOLN: 30.0000 mg | Freq: Four times a day (QID) | INTRAMUSCULAR | Status: DC | PRN

## 2021-09-13 MED ORDER — CEFAZOLIN SODIUM-DEXTROSE 2-4 GM/100ML-% IV SOLN
2.0000 g | INTRAVENOUS | Status: AC
  Administered 2021-09-13: 2 g via INTRAVENOUS

## 2021-09-13 MED ORDER — INSULIN ASPART 100 UNIT/ML IJ SOLN
8.0000 [IU] | Freq: Once | INTRAMUSCULAR | Status: AC
  Administered 2021-09-13: 8 [IU] via SUBCUTANEOUS

## 2021-09-13 MED ORDER — HYDROMORPHONE HCL 1 MG/ML IJ SOLN
1.0000 mg | INTRAMUSCULAR | Status: DC | PRN
  Administered 2021-09-13: 1 mg via INTRAVENOUS

## 2021-09-13 MED ORDER — HYDRALAZINE HCL 20 MG/ML IJ SOLN
INTRAMUSCULAR | Status: AC
  Filled 2021-09-13: qty 1

## 2021-09-13 MED ORDER — ENSURE PRE-SURGERY PO LIQD: 296.0000 mL | Freq: Once | ORAL | Status: DC

## 2021-09-13 MED ORDER — ROCURONIUM BROMIDE 10 MG/ML (PF) SYRINGE
PREFILLED_SYRINGE | INTRAVENOUS | Status: DC | PRN
  Administered 2021-09-13: 100 mg via INTRAVENOUS

## 2021-09-13 MED ORDER — SODIUM CHLORIDE 0.9% FLUSH
INTRAVENOUS | Status: DC | PRN
  Administered 2021-09-13: 20 mL

## 2021-09-13 MED ORDER — ACETAMINOPHEN 10 MG/ML IV SOLN: 1000.0000 mg | Freq: Once | INTRAVENOUS | Status: DC | PRN

## 2021-09-13 MED ORDER — FENTANYL CITRATE (PF) 250 MCG/5ML IJ SOLN
INTRAMUSCULAR | Status: AC
  Filled 2021-09-13: qty 5

## 2021-09-13 MED ORDER — INSULIN ASPART 100 UNIT/ML IJ SOLN
INTRAMUSCULAR | Status: AC
  Filled 2021-09-13: qty 1

## 2021-09-13 MED ORDER — ONDANSETRON HCL 4 MG/2ML IJ SOLN
INTRAMUSCULAR | Status: DC | PRN
  Administered 2021-09-13: 4 mg via INTRAVENOUS

## 2021-09-13 MED ORDER — LEVOTHYROXINE SODIUM 100 MCG PO TABS
100.0000 ug | ORAL_TABLET | Freq: Every day | ORAL | Status: DC
  Filled 2021-09-13: qty 1

## 2021-09-13 MED ORDER — LISINOPRIL 40 MG PO TABS
ORAL_TABLET | Freq: Every day | ORAL | 0 refills | Status: DC
  Filled 2021-09-13: qty 90, 90d supply, fill #0

## 2021-09-13 MED ORDER — CEFAZOLIN SODIUM-DEXTROSE 2-4 GM/100ML-% IV SOLN
INTRAVENOUS | Status: AC
  Filled 2021-09-13: qty 100

## 2021-09-13 MED ORDER — PROPOFOL 10 MG/ML IV BOLUS
INTRAVENOUS | Status: AC
  Filled 2021-09-13: qty 20

## 2021-09-13 MED ORDER — ORAL CARE MOUTH RINSE: 15.0000 mL | Freq: Once | OROMUCOSAL | Status: AC

## 2021-09-13 MED ORDER — HYDROCHLOROTHIAZIDE 25 MG PO TABS: 25.0000 mg | ORAL_TABLET | Freq: Every day | ORAL | Status: DC

## 2021-09-13 MED ORDER — ONDANSETRON 4 MG PO TBDP: 4.0000 mg | ORAL_TABLET | Freq: Four times a day (QID) | ORAL | Status: DC | PRN

## 2021-09-13 MED ORDER — LIDOCAINE 2% (20 MG/ML) 5 ML SYRINGE
INTRAMUSCULAR | Status: DC | PRN
  Administered 2021-09-13: 100 mg via INTRAVENOUS

## 2021-09-13 MED ORDER — ONDANSETRON HCL 4 MG/2ML IJ SOLN: 4.0000 mg | Freq: Four times a day (QID) | INTRAMUSCULAR | Status: DC | PRN

## 2021-09-13 MED ORDER — MIDAZOLAM HCL 5 MG/5ML IJ SOLN
INTRAMUSCULAR | Status: DC | PRN
Start: 2021-09-13 — End: 2021-09-13
  Administered 2021-09-13: 2 mg via INTRAVENOUS

## 2021-09-13 MED ORDER — BUPIVACAINE LIPOSOME 1.3 % IJ SUSP
INTRAMUSCULAR | Status: AC
  Filled 2021-09-13: qty 20

## 2021-09-13 MED ORDER — DEXAMETHASONE SODIUM PHOSPHATE 10 MG/ML IJ SOLN
INTRAMUSCULAR | Status: DC | PRN
  Administered 2021-09-13: 5 mg via INTRAVENOUS

## 2021-09-13 MED ORDER — PROPOFOL 10 MG/ML IV BOLUS
INTRAVENOUS | Status: DC | PRN
  Administered 2021-09-13: 180 mg via INTRAVENOUS

## 2021-09-13 MED ORDER — METOPROLOL TARTRATE 5 MG/5ML IV SOLN
5.0000 mg | Freq: Four times a day (QID) | INTRAVENOUS | Status: DC | PRN
  Administered 2021-09-13: 5 mg via INTRAVENOUS

## 2021-09-13 MED ORDER — DEXAMETHASONE SODIUM PHOSPHATE 10 MG/ML IJ SOLN: 4.0000 mg | INTRAMUSCULAR | Status: DC

## 2021-09-13 MED ORDER — CHLORHEXIDINE GLUCONATE CLOTH 2 % EX PADS: 6.0000 | MEDICATED_PAD | Freq: Once | CUTANEOUS | Status: DC

## 2021-09-13 MED ORDER — CHLORHEXIDINE GLUCONATE 0.12 % MT SOLN
15.0000 mL | Freq: Once | OROMUCOSAL | Status: AC
  Administered 2021-09-13: 15 mL via OROMUCOSAL

## 2021-09-13 MED ORDER — DEXMEDETOMIDINE (PRECEDEX) IN NS 20 MCG/5ML (4 MCG/ML) IV SYRINGE
PREFILLED_SYRINGE | INTRAVENOUS | Status: DC | PRN
  Administered 2021-09-13: 8 ug via INTRAVENOUS
  Administered 2021-09-13: 12 ug via INTRAVENOUS

## 2021-09-13 MED ORDER — LACTATED RINGERS IV SOLN: INTRAVENOUS | Status: DC

## 2021-09-13 MED ORDER — AMISULPRIDE (ANTIEMETIC) 5 MG/2ML IV SOLN: 10.0000 mg | Freq: Once | INTRAVENOUS | Status: DC | PRN

## 2021-09-13 MED ORDER — BUPIVACAINE LIPOSOME 1.3 % IJ SUSP
INTRAMUSCULAR | Status: DC | PRN
  Administered 2021-09-13: 20 mL

## 2021-09-13 MED ORDER — BUPIVACAINE-EPINEPHRINE 0.25% -1:200000 IJ SOLN
INTRAMUSCULAR | Status: DC | PRN
  Administered 2021-09-13: 20 mL

## 2021-09-13 MED ORDER — CHLORHEXIDINE GLUCONATE 0.12 % MT SOLN
OROMUCOSAL | Status: AC
  Filled 2021-09-13: qty 15

## 2021-09-13 MED ORDER — SYNTHROID 100 MCG PO TABS
100.0000 ug | ORAL_TABLET | Freq: Every day | ORAL | 0 refills | Status: DC
  Filled 2021-09-13: qty 90, 90d supply, fill #0

## 2021-09-13 MED ORDER — ACETAMINOPHEN 500 MG PO TABS
ORAL_TABLET | ORAL | Status: AC
  Filled 2021-09-13: qty 2

## 2021-09-13 MED ORDER — ENOXAPARIN SODIUM 40 MG/0.4ML IJ SOSY
40.0000 mg | PREFILLED_SYRINGE | INTRAMUSCULAR | Status: DC
  Administered 2021-09-14: 40 mg via SUBCUTANEOUS
  Filled 2021-09-13: qty 0.4

## 2021-09-13 MED ORDER — MIDAZOLAM HCL 2 MG/2ML IJ SOLN
INTRAMUSCULAR | Status: AC
  Filled 2021-09-13: qty 2

## 2021-09-13 MED ORDER — METOPROLOL TARTRATE 5 MG/5ML IV SOLN
INTRAVENOUS | Status: AC
  Filled 2021-09-13: qty 5

## 2021-09-13 MED ORDER — FENTANYL CITRATE (PF) 250 MCG/5ML IJ SOLN
INTRAMUSCULAR | Status: DC | PRN
  Administered 2021-09-13 (×3): 50 ug via INTRAVENOUS
  Administered 2021-09-13: 100 ug via INTRAVENOUS

## 2021-09-13 MED ORDER — FENTANYL CITRATE (PF) 100 MCG/2ML IJ SOLN
INTRAMUSCULAR | Status: AC
  Filled 2021-09-13: qty 2

## 2021-09-13 MED ORDER — HYDRALAZINE HCL 20 MG/ML IJ SOLN
10.0000 mg | Freq: Four times a day (QID) | INTRAMUSCULAR | Status: DC | PRN
  Administered 2021-09-13 – 2021-09-14 (×2): 10 mg via INTRAVENOUS
  Filled 2021-09-13: qty 1

## 2021-09-13 MED ORDER — SUGAMMADEX SODIUM 200 MG/2ML IV SOLN
INTRAVENOUS | Status: DC | PRN
  Administered 2021-09-13: 200 mg via INTRAVENOUS

## 2021-09-13 MED ORDER — LISINOPRIL 20 MG PO TABS: 40.0000 mg | ORAL_TABLET | Freq: Every day | ORAL | Status: DC

## 2021-09-13 MED ORDER — ACETAMINOPHEN 500 MG PO TABS
1000.0000 mg | ORAL_TABLET | ORAL | Status: AC
  Administered 2021-09-13: 1000 mg via ORAL

## 2021-09-13 MED ORDER — HYDROMORPHONE HCL 1 MG/ML IJ SOLN
INTRAMUSCULAR | Status: AC
  Filled 2021-09-13: qty 1

## 2021-09-13 MED ORDER — 0.9 % SODIUM CHLORIDE (POUR BTL) OPTIME
TOPICAL | Status: DC | PRN
  Administered 2021-09-13: 1000 mL

## 2021-09-13 MED ORDER — BUPIVACAINE-EPINEPHRINE (PF) 0.25% -1:200000 IJ SOLN
INTRAMUSCULAR | Status: AC
  Filled 2021-09-13: qty 30

## 2021-09-13 MED ORDER — KETOROLAC TROMETHAMINE 30 MG/ML IJ SOLN: 30.0000 mg | Freq: Four times a day (QID) | INTRAMUSCULAR | Status: AC

## 2021-09-13 MED ORDER — FLUOXETINE HCL 20 MG PO CAPS: 40.0000 mg | ORAL_CAPSULE | Freq: Every day | ORAL | Status: DC

## 2021-09-13 SURGICAL SUPPLY — 63 items
APPLIER CLIP 5 13 M/L LIGAMAX5 (MISCELLANEOUS)
CANNULA REDUC XI 12-8 STAPL (CANNULA) ×2
CANNULA REDUCER 12-8 DVNC XI (CANNULA) ×1 IMPLANT
CHLORAPREP W/TINT 26 (MISCELLANEOUS) ×2 IMPLANT
CLIP APPLIE 5 13 M/L LIGAMAX5 (MISCELLANEOUS) IMPLANT
COVER MAYO STAND STRL (DRAPES) ×2 IMPLANT
COVER SURGICAL LIGHT HANDLE (MISCELLANEOUS) ×2 IMPLANT
COVER TIP SHEARS 8 DVNC (MISCELLANEOUS) IMPLANT
COVER TIP SHEARS 8MM DA VINCI (MISCELLANEOUS)
DECANTER SPIKE VIAL GLASS SM (MISCELLANEOUS) ×2 IMPLANT
DEFOGGER SCOPE WARMER CLEARIFY (MISCELLANEOUS) ×2 IMPLANT
DERMABOND ADVANCED (GAUZE/BANDAGES/DRESSINGS) ×1
DERMABOND ADVANCED .7 DNX12 (GAUZE/BANDAGES/DRESSINGS) ×1 IMPLANT
DEVICE TROCAR PUNCTURE CLOSURE (ENDOMECHANICALS) ×2 IMPLANT
DRAIN PENROSE 0.5X18 (DRAIN) IMPLANT
DRAPE ARM DVNC X/XI (DISPOSABLE) ×4 IMPLANT
DRAPE CARDIOVASC SPLIT 88X140 (DRAPES) ×2 IMPLANT
DRAPE COLUMN DVNC XI (DISPOSABLE) ×1 IMPLANT
DRAPE DA VINCI XI ARM (DISPOSABLE) ×8
DRAPE DA VINCI XI COLUMN (DISPOSABLE) ×2
DRAPE ORTHO SPLIT 77X108 STRL (DRAPES) ×2
DRAPE SURG ORHT 6 SPLT 77X108 (DRAPES) ×1 IMPLANT
ELECT REM PT RETURN 9FT ADLT (ELECTROSURGICAL) ×2
ELECTRODE REM PT RTRN 9FT ADLT (ELECTROSURGICAL) ×1 IMPLANT
GLOVE SURG ENC MOIS LTX SZ7.5 (GLOVE) IMPLANT
GLOVE SURG SYN 7.5  E (GLOVE) ×2
GLOVE SURG SYN 7.5 E (GLOVE) ×1 IMPLANT
GOWN STRL REUS W/ TWL LRG LVL3 (GOWN DISPOSABLE) ×2 IMPLANT
GOWN STRL REUS W/ TWL XL LVL3 (GOWN DISPOSABLE) ×1 IMPLANT
GOWN STRL REUS W/TWL 2XL LVL3 (GOWN DISPOSABLE) ×2 IMPLANT
GOWN STRL REUS W/TWL LRG LVL3 (GOWN DISPOSABLE) ×4
GOWN STRL REUS W/TWL XL LVL3 (GOWN DISPOSABLE) ×2
KIT BASIN OR (CUSTOM PROCEDURE TRAY) ×2 IMPLANT
KIT TURNOVER KIT B (KITS) ×2 IMPLANT
MARKER SKIN DUAL TIP RULER LAB (MISCELLANEOUS) ×2 IMPLANT
MESH BIO-A 7X10 SYN MAT (Mesh General) ×2 IMPLANT
NEEDLE 22X1 1/2 (OR ONLY) (NEEDLE) ×2 IMPLANT
NEEDLE INSUFFLATION 14GA 120MM (NEEDLE) ×2 IMPLANT
OBTURATOR OPTICAL STANDARD 8MM (TROCAR)
OBTURATOR OPTICAL STND 8 DVNC (TROCAR)
OBTURATOR OPTICALSTD 8 DVNC (TROCAR) IMPLANT
PENCIL SMOKE EVACUATOR (MISCELLANEOUS) IMPLANT
SCISSORS LAP 5X35 DISP (ENDOMECHANICALS) IMPLANT
SEAL CANN UNIV 5-8 DVNC XI (MISCELLANEOUS) ×3 IMPLANT
SEAL XI 5MM-8MM UNIVERSAL (MISCELLANEOUS) ×6
SEALER VESSEL DA VINCI XI (MISCELLANEOUS) ×2
SEALER VESSEL EXT DVNC XI (MISCELLANEOUS) ×1 IMPLANT
SET IRRIG TUBING LAPAROSCOPIC (IRRIGATION / IRRIGATOR) ×2 IMPLANT
SET TUBE SMOKE EVAC HIGH FLOW (TUBING) ×2 IMPLANT
STAPLER CANNULA SEAL DVNC XI (STAPLE) ×1 IMPLANT
STAPLER CANNULA SEAL XI (STAPLE) ×2
STAPLER VISISTAT 35W (STAPLE) IMPLANT
STOPCOCK 4 WAY LG BORE MALE ST (IV SETS) ×2 IMPLANT
SUT ETHIBOND 0 36 GRN (SUTURE) ×4 IMPLANT
SUT ETHIBOND 2 0 SH (SUTURE)
SUT ETHIBOND 2 0 SH 36X2 (SUTURE) IMPLANT
SUT MNCRL AB 4-0 PS2 18 (SUTURE) ×2 IMPLANT
SUT SILK 0 SH 30 (SUTURE) ×2 IMPLANT
SUT VICRYL 0 UR6 27IN ABS (SUTURE) ×2 IMPLANT
SYR 30ML SLIP (SYRINGE) IMPLANT
TRAY FOLEY MTR SLVR 16FR STAT (SET/KITS/TRAYS/PACK) IMPLANT
TRAY LAPAROSCOPIC MC (CUSTOM PROCEDURE TRAY) ×2 IMPLANT
TROCAR ADV FIXATION 5X100MM (TROCAR) ×2 IMPLANT

## 2021-09-13 NOTE — Anesthesia Procedure Notes (Signed)
Procedure Name: Intubation Date/Time: 09/13/2021 1:23 PM Performed by: Griffin Dakin, CRNA Pre-anesthesia Checklist: Patient identified, Emergency Drugs available, Suction available and Patient being monitored Patient Re-evaluated:Patient Re-evaluated prior to induction Oxygen Delivery Method: Circle system utilized Preoxygenation: Pre-oxygenation with 100% oxygen Induction Type: IV induction Ventilation: Mask ventilation without difficulty Laryngoscope Size: Mac and 3 Grade View: Grade II Tube type: Oral Tube size: 7.0 mm Number of attempts: 1 Airway Equipment and Method: Stylet and Oral airway Placement Confirmation: ETT inserted through vocal cords under direct vision, positive ETCO2 and breath sounds checked- equal and bilateral Secured at: 21 cm Tube secured with: Tape Dental Injury: Teeth and Oropharynx as per pre-operative assessment

## 2021-09-13 NOTE — Anesthesia Preprocedure Evaluation (Signed)
Anesthesia Evaluation  Patient identified by MRN, date of birth, ID band Patient awake    Reviewed: Allergy & Precautions, NPO status , Patient's Chart, lab work & pertinent test results  History of Anesthesia Complications (+) PONV  Airway Mallampati: II  TM Distance: >3 FB Neck ROM: Full    Dental no notable dental hx.    Pulmonary neg pulmonary ROS,    Pulmonary exam normal        Cardiovascular hypertension, Pt. on medications  Rhythm:Regular Rate:Normal     Neuro/Psych Anxiety Depression negative neurological ROS     GI/Hepatic Neg liver ROS, hiatal hernia, GERD  Medicated,  Endo/Other  diabetes, Type 2, Oral Hypoglycemic AgentsHypothyroidism   Renal/GU negative Renal ROS  negative genitourinary   Musculoskeletal  (+) Arthritis , Osteoarthritis,    Abdominal Normal abdominal exam  (+)   Peds  Hematology  (+) anemia ,   Anesthesia Other Findings   Reproductive/Obstetrics                             Anesthesia Physical Anesthesia Plan  ASA: 2  Anesthesia Plan: General   Post-op Pain Management:    Induction: Intravenous  PONV Risk Score and Plan: 4 or greater and Ondansetron, Dexamethasone, Treatment may vary due to age or medical condition and Amisulpride  Airway Management Planned: Mask and Oral ETT  Additional Equipment: None  Intra-op Plan:   Post-operative Plan: Extubation in OR  Informed Consent: I have reviewed the patients History and Physical, chart, labs and discussed the procedure including the risks, benefits and alternatives for the proposed anesthesia with the patient or authorized representative who has indicated his/her understanding and acceptance.     Dental advisory given  Plan Discussed with: CRNA  Anesthesia Plan Comments: (Lab Results      Component                Value               Date                      WBC                      8.0                  09/05/2021                HGB                      13.0                09/05/2021                HCT                      39.5                09/05/2021                MCV                      88.0                09/05/2021                PLT  340                 09/05/2021           Lab Results      Component                Value               Date                      NA                       134 (L)             09/05/2021                K                        4.1                 09/05/2021                CO2                      26                  09/05/2021                GLUCOSE                  167 (H)             09/05/2021                BUN                      10                  09/05/2021                CREATININE               0.70                09/05/2021                CALCIUM                  9.6                 09/05/2021                EGFR                     95                  05/18/2021                GFRNONAA                 >60                 09/05/2021          )        Anesthesia Quick Evaluation

## 2021-09-13 NOTE — Discharge Instructions (Signed)
EATING AFTER YOUR ESOPHAGEAL SURGERY (Stomach Fundoplication, Hiatal Hernia repair, Achalasia surgery, etc)  ######################################################################  EAT Start with a pureed / full liquid diet (see below) Gradually transition to a high fiber diet with a fiber supplement over the next month after discharge.    WALK Walk an hour a day.  Control your pain to do that.    CONTROL PAIN Control pain so that you can walk, sleep, tolerate sneezing/coughing, go up/down stairs.  HAVE A BOWEL MOVEMENT DAILY Keep your bowels regular to avoid problems.  OK to try a laxative to override constipation.  OK to use an antidairrheal to slow down diarrhea.  Call if not better after 2 tries  CALL IF YOU HAVE PROBLEMS/CONCERNS Call if you are still struggling despite following these instructions. Call if you have concerns not answered by these instructions  ######################################################################   After your esophageal surgery, expect some sticking with swallowing over the next 1-2 months.    If food sticks when you eat, it is called "dysphagia".  This is due to swelling around your esophagus at the wrap & hiatal diaphragm repair.  It will gradually ease off over the next few months.  To help you through this temporary phase, we start you out on a pureed (blenderized) diet.  Your first meal in the hospital was thin liquids.  You should have been given a pureed diet by the time you left the hospital.  We ask patients to stay on a pureed diet for the first 2-3 weeks to avoid anything getting "stuck" near your recent surgery.  Don't be alarmed if your ability to swallow doesn't progress according to this plan.  Everyone is different and some diets can advance more or less quickly.    It is often helpful to crush your medications or split them as they can sometimes stick, especially the first week or so.   Some BASIC RULES to follow  are:  Maintain an upright position whenever eating or drinking.  Take small bites - just a teaspoon size bite at a time.  Eat slowly.  It may also help to eat only one food at a time.  Consider nibbling through smaller, more frequent meals & avoid the urge to eat BIG meals  Do not push through feelings of fullness, nausea, or bloatedness  Do not mix solid foods and liquids in the same mouthful  Try not to "wash foods down" with large gulps of liquids.  Avoid carbonated (bubbly/fizzy) drinks.    Avoid foods that make you feel gassy or bloated.  Start with bland foods first.  Wait on trying greasy, fried, or spicy meals until you are tolerating more bland solids well.  Understand that it will be hard to burp and belch at first.  This gradually improves with time.  Expect to be more gassy/flatulent/bloated initially.  Walking will help your body manage it better.  Consider using medications for bloating that contain simethicone such as  Maalox or Gas-X   Consider crushing her medications, especially smaller pills.  The ability to swallow pills should get easier after a few weeks  Eat in a relaxed atmosphere & minimize distractions.  Avoid talking while eating.    Do not use straws.  Following each meal, sit in an upright position (90 degree angle) for 60 to 90 minutes.  Going for a short walk can help as well  If food does stick, don't panic.  Try to relax and let the food pass on its own.    Sipping WARM LIQUID such as strong hot black tea can also help slide it down.   Be gradual in changes & use common sense:  -If you easily tolerating a certain "level" of foods, advance to the next level gradually -If you are having trouble swallowing a particular food, then avoid it.   -If food is sticking when you advance your diet, go back to thinner previous diet (the lower LEVEL) for 1-2 days.  LEVEL 1 = PUREED DIET  Do for the first 2 WEEKS AFTER SURGERY  -Foods in this group are  pureed or blenderized to a smooth, mashed potato-like consistency.  -If necessary, the pureed foods can keep their shape with the addition of a thickening agent.   -Meat should be pureed to a smooth, pasty consistency.  Hot broth or gravy may be added to the pureed meat, approximately 1 oz. of liquid per 3 oz. serving of meat. -CAUTION:  If any foods do not puree into a smooth consistency, swallowing will be more difficult.  (For example, nuts or seeds sometimes do not blend well.)  Hot Foods Cold Foods  Pureed scrambled eggs and cheese Pureed cottage cheese  Baby cereals Thickened juices and nectars  Thinned cooked cereals (no lumps) Thickened milk or eggnog  Pureed French toast or pancakes Ensure  Mashed potatoes Ice cream  Pureed parsley, au gratin, scalloped potatoes, candied sweet potatoes Fruit or Italian ice, sherbet  Pureed buttered or alfredo noodles Plain yogurt  Pureed vegetables (no corn or peas) Instant breakfast  Pureed soups and creamed soups Smooth pudding, mousse, custard  Pureed scalloped apples Whipped gelatin  Gravies Sugar, syrup, honey, jelly  Sauces, cheese, tomato, barbecue, white, creamed Cream  Any baby food Creamer  Alcohol in moderation (not beer or champagne) Margarine  Coffee or tea Mayonnaise   Ketchup, mustard   Apple sauce   SAMPLE MENU:  PUREED DIET Breakfast Lunch Dinner   Orange juice, 1/2 cup  Cream of wheat, 1/2 cup  Pineapple juice, 1/2 cup  Pureed turkey, barley soup, 3/4 cup  Pureed Hawaiian chicken, 3 oz   Scrambled eggs, mashed or blended with cheese, 1/2 cup  Tea or coffee, 1 cup   Whole milk, 1 cup   Non-dairy creamer, 2 Tbsp.  Mashed potatoes, 1/2 cup  Pureed cooled broccoli, 1/2 cup  Apple sauce, 1/2 cup  Coffee or tea  Mashed potatoes, 1/2 cup  Pureed spinach, 1/2 cup  Frozen yogurt, 1/2 cup  Tea or coffee      LEVEL 2 = SOFT DIET  After your first 2 weeks, you can advance to a soft diet.   Keep on this  diet until everything goes down easily.  Hot Foods Cold Foods  White fish Cottage cheese  Stuffed fish Junior baby fruit  Baby food meals Semi thickened juices  Minced soft cooked, scrambled, poached eggs nectars  Souffle & omelets Ripe mashed bananas  Cooked cereals Canned fruit, pineapple sauce, milk  potatoes Milkshake  Buttered or Alfredo noodles Custard  Cooked cooled vegetable Puddings, including tapioca  Sherbet Yogurt  Vegetable soup or alphabet soup Fruit ice, Italian ice  Gravies Whipped gelatin  Sugar, syrup, honey, jelly Junior baby desserts  Sauces:  Cheese, creamed, barbecue, tomato, white Cream  Coffee or tea Margarine   SAMPLE MENU:  LEVEL 2 Breakfast Lunch Dinner   Orange juice, 1/2 cup  Oatmeal, 1/2 cup  Scrambled eggs with cheese, 1/2 cup  Decaffeinated tea, 1 cup  Whole milk, 1 cup    Non-dairy creamer, 2 Tbsp  Pineapple juice, 1/2 cup  Minced beef, 3 oz  Gravy, 2 Tbsp  Mashed potatoes, 1/2 cup  Minced fresh broccoli, 1/2 cup  Applesauce, 1/2 cup  Coffee, 1 cup  Turkey, barley soup, 3/4 cup  Minced Hawaiian chicken, 3 oz  Mashed potatoes, 1/2 cup  Cooked spinach, 1/2 cup  Frozen yogurt, 1/2 cup  Non-dairy creamer, 2 Tbsp      LEVEL 3 = CHOPPED DIET  -After all the foods in level 2 (soft diet) are passing through well you should advance up to more chopped foods.  -It is still important to cut these foods into small pieces and eat slowly.  Hot Foods Cold Foods  Poultry Cottage cheese  Chopped Swedish meatballs Yogurt  Meat salads (ground or flaked meat) Milk  Flaked fish (tuna) Milkshakes  Poached or scrambled eggs Soft, cold, dry cereal  Souffles and omelets Fruit juices or nectars  Cooked cereals Chopped canned fruit  Chopped French toast or pancakes Canned fruit cocktail  Noodles or pasta (no rice) Pudding, mousse, custard  Cooked vegetables (no frozen peas, corn, or mixed vegetables) Green salad  Canned small sweet peas  Ice cream  Creamed soup or vegetable soup Fruit ice, Italian ice  Pureed vegetable soup or alphabet soup Non-dairy creamer  Ground scalloped apples Margarine  Gravies Mayonnaise  Sauces:  Cheese, creamed, barbecue, tomato, white Ketchup  Coffee or tea Mustard   SAMPLE MENU:  LEVEL 3 Breakfast Lunch Dinner   Orange juice, 1/2 cup  Oatmeal, 1/2 cup  Scrambled eggs with cheese, 1/2 cup  Decaffeinated tea, 1 cup  Whole milk, 1 cup  Non-dairy creamer, 2 Tbsp  Ketchup, 1 Tbsp  Margarine, 1 tsp  Salt, 1/4 tsp  Sugar, 2 tsp  Pineapple juice, 1/2 cup  Ground beef, 3 oz  Gravy, 2 Tbsp  Mashed potatoes, 1/2 cup  Cooked spinach, 1/2 cup  Applesauce, 1/2 cup  Decaffeinated coffee  Whole milk  Non-dairy creamer, 2 Tbsp  Margarine, 1 tsp  Salt, 1/4 tsp  Pureed turkey, barley soup, 3/4 cup  Barbecue chicken, 3 oz  Mashed potatoes, 1/2 cup  Ground fresh broccoli, 1/2 cup  Frozen yogurt, 1/2 cup  Decaffeinated tea, 1 cup  Non-dairy creamer, 2 Tbsp  Margarine, 1 tsp  Salt, 1/4 tsp  Sugar, 1 tsp    LEVEL 4:  REGULAR FOODS  -Foods in this group are soft, moist, regularly textured foods.   -This level includes meat and breads, which tend to be the hardest things to swallow.   -Eat very slowly, chew well and continue to avoid carbonated drinks. -most people are at this level in 4-6 weeks  Hot Foods Cold Foods  Baked fish or skinned Soft cheeses - cottage cheese  Souffles and omelets Cream cheese  Eggs Yogurt  Stuffed shells Milk  Spaghetti with meat sauce Milkshakes  Cooked cereal Cold dry cereals (no nuts, dried fruit, coconut)  French toast or pancakes Crackers  Buttered toast Fruit juices or nectars  Noodles or pasta (no rice) Canned fruit  Potatoes (all types) Ripe bananas  Soft, cooked vegetables (no corn, lima, or baked beans) Peeled, ripe, fresh fruit  Creamed soups or vegetable soup Cakes (no nuts, dried fruit, coconut)  Canned chicken  noodle soup Plain doughnuts  Gravies Ice cream  Bacon dressing Pudding, mousse, custard  Sauces:  Cheese, creamed, barbecue, tomato, white Fruit ice, Italian ice, sherbet  Decaffeinated tea or coffee Whipped gelatin  Pork chops Regular gelatin     Canned fruited gelatin molds   Sugar, syrup, honey, jam, jelly   Cream   Non-dairy   Margarine   Oil   Mayonnaise   Ketchup   Mustard   TROUBLESHOOTING IRREGULAR BOWELS  1) Avoid extremes of bowel movements (no bad constipation/diarrhea)  2) Miralax 17gm mixed in 8oz. water or juice-daily. May use BID as needed.  3) Gas-x,Phazyme, etc. as needed for gas & bloating.  4) Soft,bland diet. No spicy,greasy,fried foods.  5) Prilosec over-the-counter as needed  6) May hold gluten/wheat products from diet to see if symptoms improve.  7) May try probiotics (Align, Activa, etc) to help calm the bowels down  7) If symptoms become worse call back immediately.    If you have any questions please call our office at [facility]: [phone].  

## 2021-09-13 NOTE — Op Note (Signed)
09/13/2021  3:00 PM  PATIENT:  Robin Greene  65 y.o. female  PRE-OPERATIVE DIAGNOSIS:  HIATAL HERNIA  POST-OPERATIVE DIAGNOSIS:  HIATAL HERNIA  PROCEDURE:  Procedure(s): XI ROBOTIC ASSISTED HIATAL HERNIA REPAIR WITH TOUPET FUNDOLPLICATION WITH MESH (N/A) INSERTION OF MESH (N/A)  SURGEON:  Surgeon(s) and Role:    * Ralene Ok, MD - Primary  ASSISTANTS: Pryor Curia, RNFA   ANESTHESIA:   local and general  EBL:  minimal   BLOOD ADMINISTERED:none  DRAINS: none   LOCAL MEDICATIONS USED:  BUPIVICAINE   SPECIMEN:  No Specimen  DISPOSITION OF SPECIMEN:  N/A  COUNTS:  YES  TOURNIQUET:  * No tourniquets in log *  DICTATION: .Dragon Dictation Findings: Patient had a small approximate 3 cm head hernia.  Patient had a large amount of her stomach within her left chest.  Patient went toupee fundoplication.  Patient bio a core mesh was placed and sutured at the hiatus.  Details of procedure: The patient was taken back to the operating room and placed in the supine position with bilateral SCDs in place. The patient was prepped and draped in the usual sterile fashion. After appropriate antibiotics were confirmed a timeout was called and all facts were verified.   A Veress needle technique was used to insufflate the abdomen to 15 mm of mercury the paramedian stab incision. Subsequent to this an 8 mm trocar was introduced as was a 8 millimeter camera. At this time the subsequent robotic trochars x3, were then placed adjacent to this trocar approximately 8-10 cm away. Each trocar was inserted under direct visualization, there were total of 4 trochars. A 71mm trocar was placed in the midclavicular line.  A 0vicryl was placed to help with closure at the end of the case.The assistant trocar was then placed in the right lower quadrant under direct visualization. The Nathanson retractor was then visualized inserted into the abdomen and the incision just to the left of the falciform  ligament. This was then placed to retract the liver appropriately. At this time the patient was positioned in reverse Trendelenburg.   At this time the robot patient cart was brought to the bedside and placed in good position and the arms were docked to the trochars appropriately. At this time I proceeded to incised the gastrohepatic ligament.  At this time I proceeded to mobilize the stomach inferiorly and visualize the right crus. The peritoneum over the right crus was incised and right crus was identified. I proceeded to dissect this inferiorly until the left crus was seen joining the right crus. Once the right crus was adequately dissected we turned our to the left crus which was dissected away. This required traction of the stomach to the right side. Once this was visualized we then proceeded to circumferentially dissect the esophagus away from the surrounding tissue. The anterior and posterior vagus was seen along the esophagus at the GE junction.  These were both preserved throughout the entire case. There was a moderate-sized hiatal hernia seen. I mobilized the esophagus cephalad approximately 4-5 cm, clearing away the surrounding tissue. The anterior hernia sac was dissected away from the stomach and esophagus.  At this time we turned our attention to the greater curvature the stomach and the omentum was mobilized using the robotic vessel sealer. This was taken up to the greater curvature to the hiatus. This mobilized the entire greater curvature to allow mobilization and the wrap. I then proceeded to bring the greater curvature the stomach posterior to the  esophagus, and a shoeshine technique was used to evaluate the mobilization of the greater curvature.   At this time I proceeded to close the hiatus using interrupted 0 Ethibonds x 3. This brought together the hiatal closure without undue stricture to the esophagus.   A piece of Gore Bio A hiatal mesh was placed over the hiatal closure and sutured  to the crus using 0 Ethibonds sutures x 4.  The hernia sac was dissected off as was the anterior posterior fat pads.  This was discarded.  At this time the greater curvature was brought around the esophagus and sutured using 0 silk sutures interrupted fashion approximately 1 cm apart x3 on each side of the esophagus in a Toupet fashion. A left collar stitch was then used to gastropexy the stomach from the wrap to the diaphragm just lateral to the left crus as.  A second collar stitch was placed from the wrap to the right crus.  The wrap lay loose with no strangulation of the esophagus.  This was done in a toupee fashion.  At this time the robot was undocked. The liver trocar was removed. At this time insufflation was evacuated. Skin was reapproximated for Monocryl subcuticular fashion. The skin was then dressed with Dermabond. The patient tolerated the procedure well and was taken to the recovery room in stable condition.   PLAN OF CARE: Admit for overnight observation  PATIENT DISPOSITION:  PACU - hemodynamically stable.   Delay start of Pharmacological VTE agent (>24hrs) due to surgical blood loss or risk of bleeding: not applicable

## 2021-09-13 NOTE — Transfer of Care (Signed)
Immediate Anesthesia Transfer of Care Note  Patient: Robin Greene  Procedure(s) Performed: XI ROBOTIC ASSISTED HIATAL HERNIA REPAIR WITH FUNDOLPLICATION WITH MESH (Abdomen) INSERTION OF MESH (Abdomen)  Patient Location: PACU  Anesthesia Type:General  Level of Consciousness: awake, alert  and oriented  Airway & Oxygen Therapy: Patient Spontanous Breathing  Post-op Assessment: Report given to RN and Post -op Vital signs reviewed and stable  Post vital signs: Reviewed and stable  Last Vitals:  Vitals Value Taken Time  BP 160/59 09/13/21 1528  Temp    Pulse 76 09/13/21 1534  Resp 16 09/13/21 1534  SpO2 93 % 09/13/21 1534  Vitals shown include unvalidated device data.  Last Pain:  Vitals:   09/13/21 1101  TempSrc:   PainSc: 0-No pain         Complications: No notable events documented.

## 2021-09-13 NOTE — Interval H&P Note (Signed)
History and Physical Interval Note:  09/13/2021 12:19 PM  Robin Greene  has presented today for surgery, with the diagnosis of HIATAL HERNIA.  The various methods of treatment have been discussed with the patient and family. After consideration of risks, benefits and other options for treatment, the patient has consented to  Procedure(s): XI ROBOTIC [place] (N/A) as a surgical intervention.  The patient's history has been reviewed, patient examined, no change in status, stable for surgery.  I have reviewed the patient's chart and labs.  Questions were answered to the patient's satisfaction.     Ralene Ok

## 2021-09-14 ENCOUNTER — Other Ambulatory Visit (HOSPITAL_COMMUNITY): Payer: Self-pay

## 2021-09-14 ENCOUNTER — Observation Stay (HOSPITAL_COMMUNITY): Payer: No Typology Code available for payment source

## 2021-09-14 DIAGNOSIS — K449 Diaphragmatic hernia without obstruction or gangrene: Secondary | ICD-10-CM | POA: Diagnosis not present

## 2021-09-14 LAB — BASIC METABOLIC PANEL
Anion gap: 10 (ref 5–15)
BUN: 8 mg/dL (ref 8–23)
CO2: 25 mmol/L (ref 22–32)
Calcium: 9.1 mg/dL (ref 8.9–10.3)
Chloride: 93 mmol/L — ABNORMAL LOW (ref 98–111)
Creatinine, Ser: 0.64 mg/dL (ref 0.44–1.00)
GFR, Estimated: >60 mL/min (ref >60)
Glucose, Bld: 235 mg/dL — ABNORMAL HIGH (ref 70–99)
Potassium: 3.8 mmol/L (ref 3.5–5.1)
Sodium: 128 mmol/L — ABNORMAL LOW (ref 135–145)

## 2021-09-14 LAB — GLUCOSE, CAPILLARY
Glucose-Capillary: 213 mg/dL — ABNORMAL HIGH (ref 70–99)
Glucose-Capillary: 235 mg/dL — ABNORMAL HIGH (ref 70–99)

## 2021-09-14 MED ORDER — HYDROCODONE-ACETAMINOPHEN 7.5-325 MG/15ML PO SOLN
15.0000 mL | Freq: Four times a day (QID) | ORAL | 0 refills | Status: DC | PRN
  Filled 2021-09-14: qty 120, 2d supply, fill #0

## 2021-09-14 MED ORDER — DIATRIZOATE MEGLUMINE & SODIUM 66-10 % PO SOLN
120.0000 mL | Freq: Once | ORAL | Status: AC
  Administered 2021-09-14: 80 mL via ORAL
  Filled 2021-09-14: qty 120

## 2021-09-14 NOTE — Progress Notes (Signed)
1 Day Post-Op   Subjective/Chief Complaint: PT doing well Tol clears well Min pain   Objective: Vital signs in last 24 hours: Temp:  [97 F (36.1 C)-98.4 F (36.9 C)] 98.4 F (36.9 C) (11/30 0314) Pulse Rate:  [66-81] 79 (11/30 0314) Resp:  [13-20] 20 (11/30 0314) BP: (132-204)/(50-88) 132/50 (11/30 0314) SpO2:  [83 %-97 %] 94 % (11/30 0314) Weight:  [80.8 kg] 80.8 kg (11/29 1046) Last BM Date:  (PTA)  Intake/Output from previous day: 11/29 0701 - 11/30 0700 In: 1000 [I.V.:1000] Out: 450 [Urine:450] Intake/Output this shift: No intake/output data recorded.  General appearance: alert and cooperative GI: soft, non-tender; bowel sounds normal; no masses,  no organomegaly and inc c/d/i  Lab Results:  No results for input(s): WBC, HGB, HCT, PLT in the last 72 hours. BMET Recent Labs    09/14/21 0324  NA 128*  K 3.8  CL 93*  CO2 25  GLUCOSE 235*  BUN 8  CREATININE 0.64  CALCIUM 9.1   PT/INR No results for input(s): LABPROT, INR in the last 72 hours. ABG No results for input(s): PHART, HCO3 in the last 72 hours.  Invalid input(s): PCO2, PO2  Studies/Results: No results found.  Anti-infectives: Anti-infectives (From admission, onward)    Start     Dose/Rate Route Frequency Ordered Stop   09/13/21 1049  ceFAZolin (ANCEF) 2-4 GM/100ML-% IVPB       Note to Pharmacy: Gustavo Lah J: cabinet override      09/13/21 1049 09/13/21 2259   09/13/21 1045  ceFAZolin (ANCEF) IVPB 2g/100 mL premix        2 g 200 mL/hr over 30 Minutes Intravenous On call to O.R. 09/13/21 1040 09/13/21 1329       Assessment/Plan: s/p Procedure(s): XI ROBOTIC ASSISTED HIATAL HERNIA REPAIR WITH FUNDOLPLICATION WITH MESH (N/A) INSERTION OF MESH (N/A) Discharge today if esoph w/o leak Pt will be OK to crush pills at home   LOS: 0 days    Ralene Ok 09/14/2021

## 2021-09-14 NOTE — Discharge Summary (Signed)
Physician Discharge Summary  Patient ID: Robin Greene MRN: [id number] DOB/AGE: 67-Jan-1957 67 y.o.  Admit date: 09/13/2021 Discharge date: 09/14/2021  Admission Diagnoses:hiatal hernia  Discharge Diagnoses:  Principal Problem:   Hiatal hernia Active Problems:   S/P repair of paraesophageal hernia   Discharged Condition: good  Hospital Course: Pt was admitted post op to the floor.  Please see op note for full details.  Post op pt was started on a clear diet.  She underwetn DG esoph post op and was seen to have no leak.  PT had good pain control, was ambulating well on her own.  She was deemed stable for [place] and [place] home.  Consults: None  Significant Diagnostic Studies: DG eesph with no leak  Treatments: surgery: as above  Discharge Exam: Blood pressure (!) 157/62, pulse 79, temperature 98.4 F (36.9 C), temperature source Oral, resp. rate 14, height '5\' 5"'  (1.651 m), weight 80.8 kg, SpO2 93 %. General appearance: alert and cooperative GI: soft, non-tender; bowel sounds normal; no masses,  no organomegaly and inc c/d/i  Disposition: Discharge disposition: 01-Home or Self Care      Discharge Instructions     Diet - low sodium heart healthy   Complete by: As directed    Increase activity slowly   Complete by: As directed       Allergies as of 09/14/2021       Reactions   Asa [aspirin] Anaphylaxis   Crestor [rosuvastatin Calcium] Other (See Comments)   Abnormal LFTs   Claritin [loratadine] Other (See Comments)   Fainted twice    Invokana [canagliflozin] Other (See Comments)   Genital yeast infections   Jardiance [empagliflozin] Other (See Comments)   Genital yeast infection   Lipitor [atorvastatin] Other (See Comments)   Muscle aches   Simvastatin Other (See Comments)   Muscle aches        Medication List     TAKE these medications    bifidobacterium infantis capsule Take 1 capsule by mouth 3 (three) times a week.   blood glucose meter kit and  supplies Dispense based on patient and insurance preference. Use three to four times weekly as directed. (FOR ICD-10 E10.9, E11.9).   ezetimibe 10 MG tablet Commonly known as: ZETIA TAKE 1 TABLET BY MOUTH ONCE DAILY   FLUoxetine 40 MG capsule Commonly known as: PROZAC Take 1 capsule (40 mg total) by mouth daily.   fluticasone 50 MCG/ACT nasal spray Commonly known as: FLONASE Place 1 spray into both nostrils daily as needed for allergies or rhinitis.   freestyle lancets USE TO CHECK BLOOD SUGAR 3 TO 4 TIMES A WEEK AS DIRECTED   FREESTYLE LITE test strip Generic drug: glucose blood USE TO CHECK BLOOD SUGAR 3 TO 4 TIMES A WEEK AS DIRECTED   glipiZIDE 5 MG 24 hr tablet Commonly known as: GLUCOTROL XL TAKE 1 TABLET BY MOUTH ONCE A DAY WITH BREAKFAST   hydrochlorothiazide 25 MG tablet Commonly known as: HYDRODIURIL Take 1 tablet by mouth daily.   HYDROcodone-acetaminophen 7.5-325 mg/15 ml solution Commonly known as: HYCET Take 15 mLs by mouth 4 (four) times daily as needed for moderate pain.   Januvia 100 MG tablet Generic drug: sitaGLIPtin TAKE 1 TABLET BY MOUTH DAILY.   liothyronine 5 MCG tablet Commonly known as: CYTOMEL Take 2 tablets in the morning and 1 tablet in the afternoon.   liothyronine 5 MCG tablet Commonly known as: CYTOMEL Take 2 tablets (10 mcg total) by mouth in the morning AND 1  tablet (5 mcg total) daily in the afternoon.   lisinopril 40 MG tablet Commonly known as: ZESTRIL TAKE 1 TABLET BY MOUTH ONCE DAILY   metFORMIN 1000 MG tablet Commonly known as: GLUCOPHAGE TAKE 1 TABLET BY MOUTH 2 TIMES DAILY WITH A MEAL   Ozempic (2 MG/DOSE) 8 MG/3ML Sopn Generic drug: Semaglutide (2 MG/DOSE) Inject 2 mg as directed once a week.   polyethylene glycol-electrolytes 420 g solution Commonly known as: NuLYTELY Use as directed   Synthroid 100 MCG tablet Generic drug: levothyroxine TAKE 1 TABLET BY MOUTH DAILY BEFORE BREAKFAST   valACYclovir 1000 MG  tablet Commonly known as: VALTREX Take 500 mg by mouth 2 (two) times daily as needed (for 5 days for flares).   VITAMIN D3 PO Take 1 tablet by mouth daily.        Follow-up Information     Ralene Ok, MD. Schedule an appointment as soon as possible for a visit in 2 week(s).   Specialty: General Surgery Why: Post op visit Contact information: [place] [id number] [phone]                 Signed: Ralene Ok 09/14/2021, 4:15 PM

## 2021-09-14 NOTE — Progress Notes (Signed)
Pt discharged home per order. Pt cleared to go home this morning by Dr. Rosendo Gros, but opted to stay a little while this afternoon to see how she felt. At about 4 pm she said she would like to go home. Orders placed by Dr. Rosendo Gros, pt and her family educated on discharge information and had all questions answered. PIV removed, room belongings packed, and pt escorted to private vehicle in wheelchair.   Justice Rocher, RN

## 2021-09-14 NOTE — Plan of Care (Signed)
Nutrition Education Note  RD consulted for nutrition education regarding diet instructions s/p hiatal hernia repair with fundoplication (pureed diet for 2 weeks post-op).  Spoke with pt via phone call to room. Pt reports that she is doing okay today. Pt reports that she felt like liquids were getting stuck in her throat. She states that she has a swallow study scheduled for later today.   Appropriate education handouts have been attached to pt's AVS/Discharge Instructions. Pt is aware to look for these at discharge. Reviewed patient's dietary recall. Provided examples on ways to modify foods on pureed diet. Encouraged pt to blend foods using a blender or food processor and strain to obtain desired consistency (no lumps). Also encouraged use of commercial pureed foods and oral nutritional supplements such as Ensure to provide adequate calories and protein. Discussed how to modify recipes to add calories and protein.    RD discussed why it is important for patient to adhere to diet recommendations and discussed possibility of diet advancement upon MD follow-up. Teach back method used.   Expect good compliance.    Current diet order is clear liquids pending swallow study later today. Labs and medications reviewed. No further nutrition interventions warranted at this time. RD contact information provided. If additional nutrition issues arise, please re-consult RD.    Gustavus Bryant, MS, RD, LDN Inpatient Clinical Dietitian Please see AMiON for contact information.

## 2021-09-14 NOTE — Anesthesia Postprocedure Evaluation (Signed)
Anesthesia Post Note  Patient: FARRYN LINARES  Procedure(s) Performed: XI ROBOTIC ASSISTED HIATAL HERNIA REPAIR WITH FUNDOLPLICATION WITH MESH (Abdomen) INSERTION OF MESH (Abdomen)     Patient location during evaluation: PACU Anesthesia Type: General Level of consciousness: awake and alert Pain management: pain level controlled Vital Signs Assessment: post-procedure vital signs reviewed and stable Respiratory status: spontaneous breathing, nonlabored ventilation, respiratory function stable and patient connected to nasal cannula oxygen Cardiovascular status: blood pressure returned to baseline and stable Postop Assessment: no apparent nausea or vomiting Anesthetic complications: no   No notable events documented.  Last Vitals:  Vitals:   09/14/21 0902 09/14/21 1126  BP: (!) 168/66 (!) 157/62  Pulse: 78 79  Resp:  14  Temp:  36.9 C  SpO2: 95% 93%    Last Pain:  Vitals:   09/14/21 1126  TempSrc: Oral  PainSc:                  March Rummage Inger Wiest

## 2021-09-15 ENCOUNTER — Encounter (HOSPITAL_COMMUNITY): Payer: Self-pay | Admitting: General Surgery

## 2021-09-15 ENCOUNTER — Encounter: Payer: Self-pay | Admitting: Hematology

## 2021-09-23 ENCOUNTER — Other Ambulatory Visit (HOSPITAL_COMMUNITY): Payer: Self-pay

## 2021-09-23 MED ORDER — VALACYCLOVIR HCL 1 G PO TABS
ORAL_TABLET | ORAL | 2 refills | Status: DC
  Filled 2021-09-23: qty 30, 30d supply, fill #0

## 2021-10-06 ENCOUNTER — Other Ambulatory Visit (HOSPITAL_COMMUNITY): Payer: Self-pay

## 2021-10-12 ENCOUNTER — Other Ambulatory Visit (HOSPITAL_COMMUNITY): Payer: Self-pay

## 2021-10-31 ENCOUNTER — Other Ambulatory Visit (HOSPITAL_COMMUNITY): Payer: Self-pay

## 2021-10-31 ENCOUNTER — Other Ambulatory Visit: Payer: Self-pay | Admitting: Physician Assistant

## 2021-10-31 DIAGNOSIS — E1159 Type 2 diabetes mellitus with other circulatory complications: Secondary | ICD-10-CM

## 2021-10-31 MED ORDER — HYDROCHLOROTHIAZIDE 25 MG PO TABS
ORAL_TABLET | Freq: Every day | ORAL | 0 refills | Status: DC
  Filled 2021-10-31: qty 90, 90d supply, fill #0

## 2021-10-31 MED ORDER — LIOTHYRONINE SODIUM 5 MCG PO TABS
ORAL_TABLET | ORAL | 0 refills | Status: DC
  Filled 2021-10-31: qty 270, 90d supply, fill #0

## 2021-11-07 ENCOUNTER — Other Ambulatory Visit (HOSPITAL_COMMUNITY): Payer: Self-pay

## 2021-11-17 ENCOUNTER — Other Ambulatory Visit: Payer: Self-pay

## 2021-11-17 DIAGNOSIS — D509 Iron deficiency anemia, unspecified: Secondary | ICD-10-CM

## 2021-11-18 ENCOUNTER — Inpatient Hospital Stay (HOSPITAL_BASED_OUTPATIENT_CLINIC_OR_DEPARTMENT_OTHER): Payer: No Typology Code available for payment source | Admitting: Hematology

## 2021-11-18 ENCOUNTER — Inpatient Hospital Stay: Payer: No Typology Code available for payment source | Attending: Hematology

## 2021-11-18 ENCOUNTER — Other Ambulatory Visit: Payer: Self-pay

## 2021-11-18 VITALS — BP 152/83 | HR 73 | Temp 97.5°F | Resp 20 | Wt 161.8 lb

## 2021-11-18 DIAGNOSIS — D509 Iron deficiency anemia, unspecified: Secondary | ICD-10-CM | POA: Insufficient documentation

## 2021-11-18 DIAGNOSIS — Z7984 Long term (current) use of oral hypoglycemic drugs: Secondary | ICD-10-CM | POA: Diagnosis not present

## 2021-11-18 DIAGNOSIS — Z79899 Other long term (current) drug therapy: Secondary | ICD-10-CM | POA: Diagnosis not present

## 2021-11-18 LAB — CMP (CANCER CENTER ONLY)
ALT: 53 U/L — ABNORMAL HIGH (ref 0–44)
AST: 54 U/L — ABNORMAL HIGH (ref 15–41)
Albumin: 4.3 g/dL (ref 3.5–5.0)
Alkaline Phosphatase: 91 U/L (ref 38–126)
Anion gap: 13 (ref 5–15)
BUN: 11 mg/dL (ref 8–23)
CO2: 25 mmol/L (ref 22–32)
Calcium: 9.7 mg/dL (ref 8.9–10.3)
Chloride: 98 mmol/L (ref 98–111)
Creatinine: 0.67 mg/dL (ref 0.44–1.00)
GFR, Estimated: >60 mL/min (ref >60)
Glucose, Bld: 235 mg/dL — ABNORMAL HIGH (ref 70–99)
Potassium: 3.6 mmol/L (ref 3.5–5.1)
Sodium: 136 mmol/L (ref 135–145)
Total Bilirubin: 0.6 mg/dL (ref 0.3–1.2)
Total Protein: 7.3 g/dL (ref 6.5–8.1)

## 2021-11-18 LAB — CBC WITH DIFFERENTIAL (CANCER CENTER ONLY)
Abs Immature Granulocytes: 0.03 10*3/uL (ref 0.00–0.07)
Basophils Absolute: 0.1 10*3/uL (ref 0.0–0.1)
Basophils Relative: 1 %
Eosinophils Absolute: 0.5 10*3/uL (ref 0.0–0.5)
Eosinophils Relative: 6 %
HCT: 39.5 % (ref 36.0–46.0)
Hemoglobin: 13.3 g/dL (ref 12.0–15.0)
Immature Granulocytes: 0 %
Lymphocytes Relative: 16 %
Lymphs Abs: 1.3 10*3/uL (ref 0.7–4.0)
MCH: 29.4 pg (ref 26.0–34.0)
MCHC: 33.7 g/dL (ref 30.0–36.0)
MCV: 87.2 fL (ref 80.0–100.0)
Monocytes Absolute: 0.5 10*3/uL (ref 0.1–1.0)
Monocytes Relative: 6 %
Neutro Abs: 6 10*3/uL (ref 1.7–7.7)
Neutrophils Relative %: 71 %
Platelet Count: 305 10*3/uL (ref 150–400)
RBC: 4.53 MIL/uL (ref 3.87–5.11)
RDW: 13.2 % (ref 11.5–15.5)
WBC Count: 8.4 10*3/uL (ref 4.0–10.5)
nRBC: 0 % (ref 0.0–0.2)

## 2021-11-18 LAB — IRON AND IRON BINDING CAPACITY (CC-WL,HP ONLY)
Iron: 92 ug/dL (ref 28–170)
Saturation Ratios: 29 % (ref 10.4–31.8)
TIBC: 321 ug/dL (ref 250–450)
UIBC: 229 ug/dL (ref 148–442)

## 2021-11-18 LAB — FERRITIN: Ferritin: 391 ng/mL — ABNORMAL HIGH (ref 11–307)

## 2021-11-18 LAB — VITAMIN B12: Vitamin B-12: 431 pg/mL (ref 180–914)

## 2021-11-20 LAB — FOLATE RBC
Folate, Hemolysate: 377 ng/mL
Folate, RBC: 898 ng/mL (ref >498)
Hematocrit: 42 % (ref 34.0–46.6)

## 2021-11-22 ENCOUNTER — Other Ambulatory Visit (HOSPITAL_COMMUNITY): Payer: Self-pay

## 2021-11-24 ENCOUNTER — Encounter: Payer: Self-pay | Admitting: Hematology

## 2021-11-24 NOTE — Progress Notes (Addendum)
HEMATOLOGY/ONCOLOGY CLINIC NOTE  Date of Service: .11/18/2021   Patient Care Team: Lorrene Reid, PA-C as PCP - Windle Guard, MD as Consulting Physician (Gastroenterology) Delrae Rend, MD as Consulting Physician (Endocrinology) Druscilla Brownie, MD as Consulting Physician (Dermatology) Arvella Nigh, MD as Consulting Physician (Obstetrics and Gynecology) Vevelyn Royals, MD as Consulting Physician (Ophthalmology)  CHIEF COMPLAINTS/PURPOSE OF CONSULTATION:  Follow-up for management of anemia  INTERVAL HISTORY  Robin Greene is here for continued evaluation and management of her anemia.  She continues to follow with Dr. Cristina Gong and has had her last EGD and colonoscopy on 07/08/2021. She notes no overt melena or hematochezia.  Labs done today show CBC with a normal hemoglobin of 13.3 with WBC count of 8.4k and platelets of 305k CMP unremarkable other than a blood sugar of 235 RBC folate level within normal limits Ferritin 391 with iron saturation of 29% B12 431   MEDICAL HISTORY:  Past Medical History:  Diagnosis Date   Anemia    Arthritis    Complication of anesthesia    Depression 2022   Diabetes mellitus    dx 2010   GERD (gastroesophageal reflux disease)    occasional   will take OTC   History of hiatal hernia 2022   HSV-1 infection    Hypertension    Hypothyroidism 1990   PONV (postoperative nausea and vomiting)    Vitamin D deficiency     SURGICAL HISTORY: Past Surgical History:  Procedure Laterality Date   ABDOMINAL HYSTERECTOMY     EYE SURGERY     INSERTION OF MESH N/A 04/30/2017   Procedure: INSERTION OF MESH;  Surgeon: Jackolyn Confer, MD;  Location: [place];  Service: General;  Laterality: N/A;   INSERTION OF MESH N/A 09/13/2021   Procedure: INSERTION OF MESH;  Surgeon: Ralene Ok, MD;  Location: [place];  Service: General;  Laterality: N/A;   OVARY SURGERY     REFRACTIVE SURGERY     UMBILICAL HERNIA REPAIR N/A 04/30/2017    Procedure: UMBILICAL HERNIA REPAIR WITH MESH;  Surgeon: Jackolyn Confer, MD;  Location: [place];  Service: General;  Laterality: N/A;   XI ROBOTIC ASSISTED HIATAL HERNIA REPAIR N/A 09/13/2021   Procedure: XI ROBOTIC [place];  Surgeon: Ralene Ok, MD;  Location: [facility];  Service: General;  Laterality: N/A;    SOCIAL HISTORY: Social History   Socioeconomic History   Marital status: Married    Spouse name: Not on file   Number of children: Not on file   Years of education: Not on file   Highest education level: Not on file  Occupational History   Not on file  Tobacco Use   Smoking status: Never   Smokeless tobacco: Never  Vaping Use   Vaping Use: Never used  Substance and Sexual Activity   Alcohol use: Yes    Comment: occasionally, a glass of wine a couple of times a month   Drug use: No   Sexual activity: Yes    Birth control/protection: Surgical  Other Topics Concern   Not on file  Social History Narrative   Not on file   Social Determinants of Health   Financial Resource Strain: Not on file  Food Insecurity: Not on file  Transportation Needs: Not on file  Physical Activity: Not on file  Stress: Not on file  Social Connections: Not on file  Intimate Partner Violence: Not on file    FAMILY HISTORY: Family History  Problem Relation Age of Onset   Sjogren's syndrome Mother    Hypertension Father    Hyperlipidemia Father    Healthy Sister     ALLERGIES:  is allergic to asa [aspirin], crestor [rosuvastatin calcium], claritin [loratadine], invokana [canagliflozin], jardiance [empagliflozin], lipitor [atorvastatin], and simvastatin.  MEDICATIONS:  Current Outpatient Medications  Medication Sig Dispense Refill   bifidobacterium infantis (ALIGN) capsule Take 1 capsule by mouth 3 (three) times a week.     blood glucose meter kit and supplies Dispense based on patient and insurance preference. Use three to four  times weekly as directed. (FOR ICD-10 E10.9, E11.9). 1 each 0   Cholecalciferol (VITAMIN D3 PO) Take 1 tablet by mouth daily.     ezetimibe (ZETIA) 10 MG tablet TAKE 1 TABLET BY MOUTH ONCE DAILY 90 tablet 0   FLUoxetine (PROZAC) 40 MG capsule Take 1 capsule (40 mg total) by mouth daily. 90 capsule 3   fluticasone (FLONASE) 50 MCG/ACT nasal spray Place 1 spray into both nostrils daily as needed for allergies or rhinitis.     glipiZIDE (GLUCOTROL XL) 5 MG 24 hr tablet TAKE 1 TABLET BY MOUTH ONCE A DAY WITH BREAKFAST 90 tablet 1   glucose blood (FREESTYLE LITE) test strip USE TO CHECK BLOOD SUGAR 3 TO 4 TIMES A WEEK AS DIRECTED 100 strip 1   hydrochlorothiazide (HYDRODIURIL) 25 MG tablet Take 1 tablet by mouth daily. 90 tablet 0   HYDROcodone-acetaminophen (HYCET) 7.5-325 mg/15 ml solution Take 15 mLs by mouth 4 (four) times daily as needed for moderate pain. (Patient not taking: Reported on 11/18/2021) 120 mL 0   Lancets (FREESTYLE) lancets USE TO CHECK BLOOD SUGAR 3 TO 4 TIMES A WEEK AS DIRECTED 100 each 1   liothyronine (CYTOMEL) 5 MCG tablet Take 2 tablets in the morning and 1 tablet in the afternoon. 270 tablet 0   liothyronine (CYTOMEL) 5 MCG tablet Take 2 tablets by mouth each morning AND 1 tablet daily in the afternoon. 270 tablet 0   lisinopril (ZESTRIL) 40 MG tablet TAKE 1 TABLET BY MOUTH ONCE DAILY 90 tablet 0   metFORMIN (GLUCOPHAGE) 1000 MG tablet TAKE 1 TABLET BY MOUTH 2 TIMES DAILY WITH A MEAL 180 tablet 0   polyethylene glycol-electrolytes (NULYTELY) 420 g solution Use as directed 4000 mL 0   Semaglutide, 2 MG/DOSE, 8 MG/3ML SOPN Inject 2 mg as directed once a week. 15 mL 1   sitaGLIPtin (JANUVIA) 100 MG tablet TAKE 1 TABLET BY MOUTH DAILY. 90 tablet 1   SYNTHROID 100 MCG tablet TAKE 1 TABLET BY MOUTH DAILY BEFORE BREAKFAST 90 tablet 0   valACYclovir (VALTREX) 1000 MG tablet Take 500 mg by mouth 2 (two) times daily as needed (for 5 days for flares).     valACYclovir (VALTREX) 1000 MG  tablet Take 1/2 tablet by mouth twice daily x 5 days as needed for flares 30 tablet 2   No current facility-administered medications for this visit.    REVIEW OF SYSTEMS:    10 Point review of Systems was done is negative except as noted above.   PHYSICAL EXAMINATION: ECOG PERFORMANCE STATUS: 1 - Symptomatic but completely ambulatory  . Vitals:   11/18/21 1238  BP: (!) 152/83  Pulse: 73  Resp: 20  Temp: (!) 97.5 F (36.4 C)  SpO2: 99%   Filed Weights   11/18/21 1238  Weight: 161 lb 12.8 oz (73.4 kg)   .Body mass index is 26.92 kg/m. NAD GENERAL:alert, in no acute distress and comfortable  SKIN: no acute rashes, no significant lesions EYES: conjunctiva are pink and non-injected, sclera anicteric OROPHARYNX: MMM, no exudates, no oropharyngeal erythema or ulceration NECK: supple, no JVD LYMPH:  no palpable lymphadenopathy in the cervical, axillary or inguinal regions LUNGS: clear to auscultation b/l with normal respiratory effort HEART: regular rate & rhythm ABDOMEN:  normoactive bowel sounds , non tender, not distended. Extremity: no pedal edema PSYCH: alert & oriented x 3 with fluent speech NEURO: no focal motor/sensory deficits   LABORATORY DATA:  I have reviewed the data as listed  . CBC Latest Ref Rng & Units 11/18/2021 11/18/2021 09/05/2021  WBC 4.0 - 10.5 K/uL 8.4 - 8.0  Hemoglobin 12.0 - 15.0 g/dL 13.3 - 13.0  Hematocrit 34.0 - 46.6 % 42.0 39.5 39.5  Platelets 150 - 400 K/uL 305 - 340    . CMP Latest Ref Rng & Units 11/18/2021 09/14/2021 09/05/2021  Glucose 70 - 99 mg/dL 235(H) 235(H) 167(H)  BUN 8 - 23 mg/dL _0 Creatinine 0.44 - 1.00 mg/dL 0.67 0.64 0.70  Sodium 135 - 145 mmol/L 136 128(L) 134(L)  Potassium 3.5 - 5.1 mmol/L 3.6 3.8 4.1  Chloride 98 - 111 mmol/L 98 93(L) 99  CO2 22 - 32 mmol/L _1 Calcium 8.9 - 10.3 mg/dL 9.7 9.1 9.6  Total Protein 6.5 - 8.1 g/dL 7.3 - -  Total Bilirubin 0.3 - 1.2 mg/dL 0.6 - -  Alkaline Phos 38 - 126 U/L  91 - -  AST 15 - 41 U/L 54(H) - -  ALT 0 - 44 U/L 53(H) - -   . Lab Results  Component Value Date   IRON 92 11/18/2021   TIBC 321 11/18/2021   IRONPCTSAT 29 11/18/2021   (Iron and TIBC)  Lab Results  Component Value Date   FERRITIN 391 (H) 11/18/2021   B12 --431   RADIOGRAPHIC STUDIES: I have personally reviewed the radiological images as listed and agreed with the findings in the report. No results found.  ASSESSMENT & PLAN:   66 yo with   1) Microcytic ANemia due to severe iron deficiency 2) Iron deficiency likely related to GI losses -- possibly related to large hiatal hernia with blood loss -no evidence of pernicious anemia with neg parietal cell and IF antibodies. PLAN: -Done today reviewed -show CBC with a normal hemoglobin of 13.3 with WBC count of 8.4k and platelets of 305k CMP unremarkable other than a blood sugar of 235 RBC folate level within normal limits Ferritin 391 with iron saturation of 29% B12 431  -Patient has no indication for additional IV iron at this time. -Iron levels have been stable in last 2 visits with resolution of anemia. -Continue follow-up with PCP and GI for continued monitoring and management of GI bleeding related issues. -Kindly reconsult [place] if worsening anemia arises again.  FOLLOW UP: Return to clinic with Dr. Irene Limbo as needed   All of the patients questions were answered with apparent satisfaction. The patient knows to call the clinic with any problems, questions or concerns.   Sullivan Lone MD [place] AAHIVMS [facility] Hematology/Oncology Physician [facility]

## 2021-11-28 ENCOUNTER — Encounter: Payer: Self-pay | Admitting: Nurse Practitioner

## 2021-12-07 ENCOUNTER — Other Ambulatory Visit (HOSPITAL_COMMUNITY): Payer: Self-pay

## 2021-12-25 ENCOUNTER — Other Ambulatory Visit: Payer: Self-pay | Admitting: Physician Assistant

## 2021-12-25 ENCOUNTER — Other Ambulatory Visit (HOSPITAL_COMMUNITY): Payer: Self-pay

## 2021-12-25 DIAGNOSIS — E039 Hypothyroidism, unspecified: Secondary | ICD-10-CM

## 2021-12-26 ENCOUNTER — Other Ambulatory Visit (HOSPITAL_COMMUNITY): Payer: Self-pay

## 2021-12-26 ENCOUNTER — Other Ambulatory Visit: Payer: Self-pay

## 2021-12-26 ENCOUNTER — Ambulatory Visit (INDEPENDENT_AMBULATORY_CARE_PROVIDER_SITE_OTHER): Payer: No Typology Code available for payment source | Admitting: Nurse Practitioner

## 2021-12-26 ENCOUNTER — Encounter: Payer: Self-pay | Admitting: Nurse Practitioner

## 2021-12-26 VITALS — BP 144/77 | HR 76 | Temp 97.8°F | Ht 65.0 in | Wt 158.4 lb

## 2021-12-26 DIAGNOSIS — B3731 Acute candidiasis of vulva and vagina: Secondary | ICD-10-CM | POA: Diagnosis not present

## 2021-12-26 DIAGNOSIS — E1169 Type 2 diabetes mellitus with other specified complication: Secondary | ICD-10-CM | POA: Diagnosis not present

## 2021-12-26 DIAGNOSIS — E039 Hypothyroidism, unspecified: Secondary | ICD-10-CM

## 2021-12-26 DIAGNOSIS — R634 Abnormal weight loss: Secondary | ICD-10-CM | POA: Diagnosis not present

## 2021-12-26 MED ORDER — SYNTHROID 100 MCG PO TABS
100.0000 ug | ORAL_TABLET | Freq: Every day | ORAL | 1 refills | Status: DC
  Filled 2021-12-26: qty 90, 90d supply, fill #0
  Filled 2022-03-27: qty 90, 90d supply, fill #1

## 2021-12-26 MED ORDER — CLOBETASOL PROPIONATE 0.05 % EX CREA
1.0000 "application " | TOPICAL_CREAM | Freq: Two times a day (BID) | CUTANEOUS | 3 refills | Status: DC
  Filled 2021-12-26: qty 45, 45d supply, fill #0
  Filled 2022-06-19: qty 45, 45d supply, fill #1
  Filled 2022-10-24: qty 45, 30d supply, fill #2

## 2021-12-26 MED ORDER — LISINOPRIL 40 MG PO TABS
ORAL_TABLET | Freq: Every day | ORAL | 1 refills | Status: DC
  Filled 2021-12-26: qty 90, 90d supply, fill #0
  Filled 2022-03-27: qty 90, 90d supply, fill #1

## 2021-12-26 NOTE — Progress Notes (Unsigned)
Established patient visit   Patient: Robin Greene   DOB: 1956/01/11   66 y.o. Female  MRN: [id number] Visit Date: 12/26/2021   Chief Complaint  Patient presents with   Thyroid Problem   Subjective    HPI  The patient is here for evaluation of possible thyroid problems. Did have Nissen Fundoplication done 38/07/1750. Since then, she continues to have trouble eating after the surgery. She has seen the GI surgeon who told her she needed to be patient. She continues to have trouble with digesting milk and milk products. She states that greasy foods make her have significant diarrhea. She states that she has diarrhea everyday since she had the surgery. She states tat she feels tired. States that hair is falling out. She is unsure if this is related to thyroid or nutrition problems. She has lost 20 pounds since the day of surgery, 09/13/2021.  She states that she does not have to go back to surgeon unless she "has a problem."  -Blood pressure is elevated. She states that she has not had her blood pressure medication.  -states that her blood sugars are generally running between 150 and 180. Has been unable to take glipizide since 09/13/2021. Has not been able to take complete oral tablets. She and her husband have been unable to crush the glipizide.    Medications: Outpatient Medications Prior to Visit  Medication Sig   bifidobacterium infantis (ALIGN) capsule Take 1 capsule by mouth 3 (three) times a week.   blood glucose meter kit and supplies Dispense based on patient and insurance preference. Use three to four times weekly as directed. (FOR ICD-10 E10.9, E11.9).   Cholecalciferol (VITAMIN D3 PO) Take 1 tablet by mouth daily.   ezetimibe (ZETIA) 10 MG tablet TAKE 1 TABLET BY MOUTH ONCE DAILY   FLUoxetine (PROZAC) 40 MG capsule Take 1 capsule (40 mg total) by mouth daily.   fluticasone (FLONASE) 50 MCG/ACT nasal spray Place 1 spray into both nostrils daily as needed for allergies or rhinitis.    glipiZIDE (GLUCOTROL XL) 5 MG 24 hr tablet TAKE 1 TABLET BY MOUTH ONCE A DAY WITH BREAKFAST   glucose blood (FREESTYLE LITE) test strip USE TO CHECK BLOOD SUGAR 3 TO 4 TIMES A WEEK AS DIRECTED   hydrochlorothiazide (HYDRODIURIL) 25 MG tablet Take 1 tablet by mouth daily.   Lancets (FREESTYLE) lancets USE TO CHECK BLOOD SUGAR 3 TO 4 TIMES A WEEK AS DIRECTED   lisinopril (ZESTRIL) 40 MG tablet TAKE 1 TABLET BY MOUTH ONCE DAILY   metFORMIN (GLUCOPHAGE) 1000 MG tablet TAKE 1 TABLET BY MOUTH 2 TIMES DAILY WITH A MEAL   polyethylene glycol-electrolytes (NULYTELY) 420 g solution Use as directed   Semaglutide, 2 MG/DOSE, 8 MG/3ML SOPN Inject 2 mg as directed once a week.   sitaGLIPtin (JANUVIA) 100 MG tablet TAKE 1 TABLET BY MOUTH DAILY.   SYNTHROID 100 MCG tablet TAKE 1 TABLET BY MOUTH DAILY BEFORE BREAKFAST   [DISCONTINUED] liothyronine (CYTOMEL) 5 MCG tablet Take 2 tablets in the morning and 1 tablet in the afternoon.   [DISCONTINUED] liothyronine (CYTOMEL) 5 MCG tablet Take 2 tablets by mouth each morning AND 1 tablet daily in the afternoon.   [DISCONTINUED] HYDROcodone-acetaminophen (HYCET) 7.5-325 mg/15 ml solution Take 15 mLs by mouth 4 (four) times daily as needed for moderate pain. (Patient not taking: Reported on 11/18/2021)   [DISCONTINUED] valACYclovir (VALTREX) 1000 MG tablet Take 500 mg by mouth 2 (two) times daily as needed (for 5 days for flares).   [  DISCONTINUED] valACYclovir (VALTREX) 1000 MG tablet Take 1/2 tablet by mouth twice daily x 5 days as needed for flares   No facility-administered medications prior to visit.    Review of Systems  {Labs (Optional):23779}   Objective    BP (!) 154/83    Pulse 76    Temp 97.8 F (36.6 C)    Ht _0  (1.651 m)    Wt 158 lb 6.4 oz (71.8 kg)    LMP  (LMP Unknown)    SpO2 98%    BMI 26.36 kg/m  BP Readings from Last 3 Encounters:  12/26/21 (!) 154/83  11/18/21 (!) 152/83  09/14/21 (!) 157/62    Wt Readings from Last 3 Encounters:   12/26/21 158 lb 6.4 oz (71.8 kg)  11/18/21 161 lb 12.8 oz (73.4 kg)  09/13/21 178 lb 3.2 oz (80.8 kg)    Physical Exam  ***  No results found for any visits on 12/26/21.  Assessment & Plan     Problem List Items Addressed This Visit   None    No follow-ups on file.         Ronnell Freshwater, NP  [facility] at [facility] [phone] (phone) [phone] (fax)  [place]

## 2021-12-27 DIAGNOSIS — B3731 Acute candidiasis of vulva and vagina: Secondary | ICD-10-CM | POA: Insufficient documentation

## 2021-12-27 DIAGNOSIS — R634 Abnormal weight loss: Secondary | ICD-10-CM | POA: Insufficient documentation

## 2021-12-27 LAB — FERRITIN: Ferritin: 272 ng/mL — ABNORMAL HIGH (ref 15–150)

## 2021-12-27 LAB — COMPREHENSIVE METABOLIC PANEL
ALT: 40 IU/L — ABNORMAL HIGH (ref 0–32)
AST: 38 IU/L (ref 0–40)
Albumin/Globulin Ratio: 2.1 (ref 1.2–2.2)
Albumin: 4.8 g/dL (ref 3.8–4.8)
Alkaline Phosphatase: 96 IU/L (ref 44–121)
BUN/Creatinine Ratio: 14 (ref 12–28)
BUN: 9 mg/dL (ref 8–27)
Bilirubin Total: 0.4 mg/dL (ref 0.0–1.2)
CO2: 22 mmol/L (ref 20–29)
Calcium: 10.7 mg/dL — ABNORMAL HIGH (ref 8.7–10.3)
Chloride: 94 mmol/L — ABNORMAL LOW (ref 96–106)
Creatinine, Ser: 0.63 mg/dL (ref 0.57–1.00)
Globulin, Total: 2.3 g/dL (ref 1.5–4.5)
Glucose: 122 mg/dL — ABNORMAL HIGH (ref 70–99)
Potassium: 4.2 mmol/L (ref 3.5–5.2)
Sodium: 135 mmol/L (ref 134–144)
Total Protein: 7.1 g/dL (ref 6.0–8.5)
eGFR: 97 mL/min/{1.73_m2} (ref >59)

## 2021-12-27 LAB — CBC
Hematocrit: 40.4 % (ref 34.0–46.6)
Hemoglobin: 13.8 g/dL (ref 11.1–15.9)
MCH: 29.9 pg (ref 26.6–33.0)
MCHC: 34.2 g/dL (ref 31.5–35.7)
MCV: 88 fL (ref 79–97)
Platelets: 339 10*3/uL (ref 150–450)
RBC: 4.61 x10E6/uL (ref 3.77–5.28)
RDW: 13.5 % (ref 11.7–15.4)
WBC: 9.3 10*3/uL (ref 3.4–10.8)

## 2021-12-27 LAB — T4, FREE: Free T4: 1.19 ng/dL (ref 0.82–1.77)

## 2021-12-27 LAB — TSH: TSH: 0.624 u[IU]/mL (ref 0.450–4.500)

## 2021-12-27 LAB — HEMOGLOBIN A1C
Est. average glucose Bld gHb Est-mCnc: 186 mg/dL
Hgb A1c MFr Bld: 8.1 % — ABNORMAL HIGH (ref 4.8–5.6)

## 2021-12-27 LAB — VITAMIN B12: Vitamin B-12: 405 pg/mL (ref 232–1245)

## 2021-12-28 ENCOUNTER — Other Ambulatory Visit: Payer: Self-pay | Admitting: Physician Assistant

## 2021-12-28 ENCOUNTER — Other Ambulatory Visit: Payer: Self-pay | Admitting: Nurse Practitioner

## 2021-12-28 ENCOUNTER — Other Ambulatory Visit (HOSPITAL_COMMUNITY): Payer: Self-pay

## 2021-12-28 DIAGNOSIS — E1169 Type 2 diabetes mellitus with other specified complication: Secondary | ICD-10-CM

## 2021-12-28 MED ORDER — METFORMIN HCL 1000 MG PO TABS
ORAL_TABLET | Freq: Two times a day (BID) | ORAL | 1 refills | Status: DC
  Filled 2021-12-28: qty 180, 90d supply, fill #0
  Filled 2022-03-27: qty 180, 90d supply, fill #1

## 2021-12-28 MED ORDER — SEMAGLUTIDE (2 MG/DOSE) 8 MG/3ML ~~LOC~~ SOPN
2.0000 mg | PEN_INJECTOR | SUBCUTANEOUS | 1 refills | Status: DC
  Filled 2021-12-28: qty 18, 84d supply, fill #0
  Filled 2022-02-07 – 2022-02-13 (×2): qty 9, 84d supply, fill #0
  Filled 2022-04-30: qty 9, 84d supply, fill #1
  Filled 2022-07-26 – 2022-07-28 (×2): qty 3, 28d supply, fill #2

## 2021-12-28 NOTE — Progress Notes (Signed)
Please let the patient know that labs are back. HgbA1c is 8.1. let's discontinue glipizide since she hasn't been able to take that since she had surgery. I have increased her ozempic to '4mg'$  weekly. I updated the prescription and sent it to [facility]. Her iron level and calcium levels were also a little elevated. I don't plan to make any changes, but when she comes back for follow up, we weill recheck bmp and ferritin level. Other labs were ok.  ?Thanks so much.   -HB

## 2021-12-28 NOTE — Progress Notes (Signed)
Patient unable to take glipizide as it cannot be crushed. Will d/c this for now and increase ozempic to '4mg'$  weekly. HgbA1c is 8.1. updated prescription and sent to [facility]  ?

## 2022-02-02 ENCOUNTER — Other Ambulatory Visit: Payer: Self-pay | Admitting: Physician Assistant

## 2022-02-02 ENCOUNTER — Other Ambulatory Visit (HOSPITAL_COMMUNITY): Payer: Self-pay

## 2022-02-02 DIAGNOSIS — E1159 Type 2 diabetes mellitus with other circulatory complications: Secondary | ICD-10-CM

## 2022-02-02 MED ORDER — LIOTHYRONINE SODIUM 5 MCG PO TABS
ORAL_TABLET | ORAL | 0 refills | Status: DC
  Filled 2022-02-02: qty 270, 90d supply, fill #0

## 2022-02-02 MED ORDER — HYDROCHLOROTHIAZIDE 25 MG PO TABS
ORAL_TABLET | Freq: Every day | ORAL | 0 refills | Status: DC
  Filled 2022-02-02: qty 90, 90d supply, fill #0

## 2022-02-07 ENCOUNTER — Other Ambulatory Visit (HOSPITAL_COMMUNITY): Payer: Self-pay

## 2022-02-13 ENCOUNTER — Other Ambulatory Visit (HOSPITAL_COMMUNITY): Payer: Self-pay

## 2022-02-14 ENCOUNTER — Other Ambulatory Visit (HOSPITAL_COMMUNITY): Payer: Self-pay

## 2022-02-14 MED ORDER — OMRON 3 SERIES BP MONITOR DEVI
0 refills | Status: AC
  Filled 2022-02-14: qty 1, 30d supply, fill #0

## 2022-02-27 ENCOUNTER — Other Ambulatory Visit (HOSPITAL_COMMUNITY): Payer: Self-pay

## 2022-03-27 ENCOUNTER — Other Ambulatory Visit (HOSPITAL_COMMUNITY): Payer: Self-pay

## 2022-03-28 ENCOUNTER — Ambulatory Visit: Payer: No Typology Code available for payment source | Admitting: Nurse Practitioner

## 2022-04-03 ENCOUNTER — Other Ambulatory Visit (HOSPITAL_COMMUNITY): Payer: Self-pay

## 2022-04-04 NOTE — Progress Notes (Unsigned)
Established patient visit   Patient: Robin Greene   DOB: 1956-03-25   66 y.o. Female  MRN: [id number] Visit Date: 04/05/2022   No chief complaint on file.  Subjective    HPI  Follow up diabetes. Due to have check of HgbA1c.  Had increased dose of Ozempic to 20m and discontinued glipizide  -blood pressure generally on high/normal side.   Medications: Outpatient Medications Prior to Visit  Medication Sig   bifidobacterium infantis (ALIGN) capsule Take 1 capsule by mouth 3 (three) times a week.   blood glucose meter kit and supplies Dispense based on patient and insurance preference. Use three to four times weekly as directed. (FOR ICD-10 E10.9, E11.9).   Blood Pressure Monitoring (OMRON 3 SERIES BP MONITOR) DEVI Use as directed   Cholecalciferol (VITAMIN D3 PO) Take 1 tablet by mouth daily.   clobetasol cream (TEMOVATE) 0.05 % Apply topically 2 times daily.   ezetimibe (ZETIA) 10 MG tablet TAKE 1 TABLET BY MOUTH ONCE DAILY   FLUoxetine (PROZAC) 40 MG capsule Take 1 capsule (40 mg total) by mouth daily.   fluticasone (FLONASE) 50 MCG/ACT nasal spray Place 1 spray into both nostrils daily as needed for allergies or rhinitis.   glipiZIDE (GLUCOTROL XL) 5 MG 24 hr tablet TAKE 1 TABLET BY MOUTH ONCE A DAY WITH BREAKFAST   glucose blood (FREESTYLE LITE) test strip USE TO CHECK BLOOD SUGAR 3 TO 4 TIMES A WEEK AS DIRECTED   hydrochlorothiazide (HYDRODIURIL) 25 MG tablet Take 1 tablet by mouth daily.   Lancets (FREESTYLE) lancets USE TO CHECK BLOOD SUGAR 3 TO 4 TIMES A WEEK AS DIRECTED   liothyronine (CYTOMEL) 5 MCG tablet Take 2 tablets by mouth each morning AND 1 tablet daily in the afternoon.   lisinopril (ZESTRIL) 40 MG tablet TAKE 1 TABLET BY MOUTH ONCE DAILY   metFORMIN (GLUCOPHAGE) 1000 MG tablet TAKE 1 TABLET BY MOUTH 2 TIMES DAILY WITH A MEAL   polyethylene glycol-electrolytes (NULYTELY) 420 g solution Use as directed   Semaglutide, 2 MG/DOSE, 8 MG/3ML SOPN Inject 2 mg under the skin once  a week.   sitaGLIPtin (JANUVIA) 100 MG tablet TAKE 1 TABLET BY MOUTH DAILY.   SYNTHROID 100 MCG tablet TAKE 1 TABLET BY MOUTH DAILY BEFORE BREAKFAST   No facility-administered medications prior to visit.    Review of Systems  {Labs (Optional):23779}   Objective    LMP  (LMP Unknown)  BP Readings from Last 3 Encounters:  12/26/21 (!) 144/77  11/18/21 (!) 152/83  09/14/21 (!) 157/62    Wt Readings from Last 3 Encounters:  12/26/21 158 lb 6.4 oz (71.8 kg)  11/18/21 161 lb 12.8 oz (73.4 kg)  09/13/21 178 lb 3.2 oz (80.8 kg)    Physical Exam  ***  No results found for any visits on 04/05/22.  Assessment & Plan     Problem List Items Addressed This Visit   None    No follow-ups on file.         HRonnell Freshwater NP  [phone](phone) [phone](fax)  [place]

## 2022-04-05 ENCOUNTER — Other Ambulatory Visit (HOSPITAL_COMMUNITY): Payer: Self-pay

## 2022-04-05 ENCOUNTER — Encounter: Payer: Self-pay | Admitting: Nurse Practitioner

## 2022-04-05 ENCOUNTER — Ambulatory Visit (INDEPENDENT_AMBULATORY_CARE_PROVIDER_SITE_OTHER): Payer: No Typology Code available for payment source | Admitting: Nurse Practitioner

## 2022-04-05 VITALS — BP 138/73 | HR 78 | Temp 98.0°F | Ht 64.96 in | Wt 152.0 lb

## 2022-04-05 DIAGNOSIS — E1169 Type 2 diabetes mellitus with other specified complication: Secondary | ICD-10-CM | POA: Diagnosis not present

## 2022-04-05 DIAGNOSIS — R10817 Generalized abdominal tenderness: Secondary | ICD-10-CM

## 2022-04-05 DIAGNOSIS — E039 Hypothyroidism, unspecified: Secondary | ICD-10-CM | POA: Diagnosis not present

## 2022-04-05 DIAGNOSIS — D509 Iron deficiency anemia, unspecified: Secondary | ICD-10-CM | POA: Diagnosis not present

## 2022-04-05 DIAGNOSIS — J3481 Nasal mucositis (ulcerative): Secondary | ICD-10-CM

## 2022-04-05 LAB — POCT GLYCOSYLATED HEMOGLOBIN (HGB A1C): Hemoglobin A1C: 6.9 % — AB (ref 4.0–5.6)

## 2022-04-05 MED ORDER — MUPIROCIN 2 % EX OINT
1.0000 | TOPICAL_OINTMENT | Freq: Two times a day (BID) | CUTANEOUS | 1 refills | Status: DC
  Filled 2022-04-05: qty 22, 11d supply, fill #0
  Filled 2022-07-26: qty 22, 11d supply, fill #1

## 2022-04-05 MED ORDER — METFORMIN HCL 1000 MG PO TABS: 500.0000 mg | ORAL_TABLET | Freq: Two times a day (BID) | ORAL | 1 refills | Status: DC

## 2022-04-05 MED ORDER — GLIPIZIDE 5 MG PO TABS
5.0000 mg | ORAL_TABLET | Freq: Every day | ORAL | 3 refills | Status: DC
  Filled 2022-04-05: qty 30, 30d supply, fill #0
  Filled 2022-04-30: qty 30, 30d supply, fill #1
  Filled 2022-05-30: qty 30, 30d supply, fill #2
  Filled 2022-06-19: qty 30, 30d supply, fill #3

## 2022-04-06 ENCOUNTER — Ambulatory Visit
Admission: RE | Admit: 2022-04-06 | Discharge: 2022-04-06 | Disposition: A | Discharge status: Home / Self Care | Payer: No Typology Code available for payment source | Source: Ambulatory Visit | Attending: Nurse Practitioner | Admitting: Nurse Practitioner

## 2022-04-06 DIAGNOSIS — R10817 Generalized abdominal tenderness: Secondary | ICD-10-CM

## 2022-04-06 LAB — BASIC METABOLIC PANEL
BUN/Creatinine Ratio: 18 (ref 12–28)
BUN: 10 mg/dL (ref 8–27)
CO2: 23 mmol/L (ref 20–29)
Calcium: 10 mg/dL (ref 8.7–10.3)
Chloride: 96 mmol/L (ref 96–106)
Creatinine, Ser: 0.57 mg/dL (ref 0.57–1.00)
Glucose: 139 mg/dL — ABNORMAL HIGH (ref 70–99)
Potassium: 3.6 mmol/L (ref 3.5–5.2)
Sodium: 140 mmol/L (ref 134–144)
eGFR: 100 mL/min/{1.73_m2} (ref >59)

## 2022-04-06 LAB — TSH+FREE T4
Free T4: 1.26 ng/dL (ref 0.82–1.77)
TSH: 0.074 u[IU]/mL — ABNORMAL LOW (ref 0.450–4.500)

## 2022-04-06 LAB — FERRITIN: Ferritin: 204 ng/mL — ABNORMAL HIGH (ref 15–150)

## 2022-04-06 LAB — LIPASE: Lipase: 51 U/L (ref 14–72)

## 2022-04-06 LAB — AMYLASE: Amylase: 52 U/L (ref 31–110)

## 2022-04-06 NOTE — Progress Notes (Signed)
Please let the patient know that the ultrasound of her gallbladder showed normal gallbladder. She does have some fatty infiltration of the liver, but no mass or suspicious lesions were noted.  Thanks so much.   -HB

## 2022-04-30 ENCOUNTER — Other Ambulatory Visit: Payer: Self-pay | Admitting: Physician Assistant

## 2022-04-30 DIAGNOSIS — I152 Hypertension secondary to endocrine disorders: Secondary | ICD-10-CM

## 2022-05-01 ENCOUNTER — Other Ambulatory Visit (HOSPITAL_COMMUNITY): Payer: Self-pay

## 2022-05-01 MED ORDER — HYDROCHLOROTHIAZIDE 25 MG PO TABS
ORAL_TABLET | Freq: Every day | ORAL | 0 refills | Status: DC
  Filled 2022-05-01: qty 90, 90d supply, fill #0

## 2022-05-01 MED ORDER — LIOTHYRONINE SODIUM 5 MCG PO TABS
ORAL_TABLET | ORAL | 0 refills | Status: DC
  Filled 2022-05-01: qty 270, 90d supply, fill #0

## 2022-05-02 ENCOUNTER — Other Ambulatory Visit (HOSPITAL_COMMUNITY): Payer: Self-pay

## 2022-05-26 ENCOUNTER — Other Ambulatory Visit (HOSPITAL_COMMUNITY): Payer: Self-pay

## 2022-05-30 ENCOUNTER — Other Ambulatory Visit: Payer: Self-pay | Admitting: Family Medicine

## 2022-05-30 ENCOUNTER — Other Ambulatory Visit (HOSPITAL_COMMUNITY): Payer: Self-pay

## 2022-05-30 DIAGNOSIS — E119 Type 2 diabetes mellitus without complications: Secondary | ICD-10-CM

## 2022-05-30 MED ORDER — BLOOD GLUCOSE METER KIT: PACK | 0 refills | Status: DC

## 2022-06-09 ENCOUNTER — Encounter: Payer: Self-pay | Admitting: Nurse Practitioner

## 2022-06-09 DIAGNOSIS — E119 Type 2 diabetes mellitus without complications: Secondary | ICD-10-CM

## 2022-06-12 MED ORDER — BLOOD GLUCOSE METER KIT: PACK | 0 refills | Status: DC

## 2022-06-19 ENCOUNTER — Other Ambulatory Visit: Payer: Self-pay | Admitting: Physician Assistant

## 2022-06-19 DIAGNOSIS — I152 Hypertension secondary to endocrine disorders: Secondary | ICD-10-CM

## 2022-06-19 DIAGNOSIS — E039 Hypothyroidism, unspecified: Secondary | ICD-10-CM

## 2022-06-20 ENCOUNTER — Other Ambulatory Visit (HOSPITAL_COMMUNITY): Payer: Self-pay

## 2022-06-21 ENCOUNTER — Other Ambulatory Visit (HOSPITAL_COMMUNITY): Payer: Self-pay

## 2022-06-21 MED ORDER — LIOTHYRONINE SODIUM 5 MCG PO TABS
ORAL_TABLET | ORAL | 0 refills | Status: DC
  Filled 2022-06-21 – 2022-07-26 (×2): qty 270, 90d supply, fill #0

## 2022-06-21 MED ORDER — SYNTHROID 100 MCG PO TABS
100.0000 ug | ORAL_TABLET | Freq: Every day | ORAL | 0 refills | Status: DC
  Filled 2022-06-21: qty 90, 90d supply, fill #0

## 2022-06-21 MED ORDER — LISINOPRIL 40 MG PO TABS
ORAL_TABLET | Freq: Every day | ORAL | 0 refills | Status: DC
  Filled 2022-06-21: qty 90, 90d supply, fill #0

## 2022-06-21 MED ORDER — HYDROCHLOROTHIAZIDE 25 MG PO TABS
25.0000 mg | ORAL_TABLET | Freq: Every day | ORAL | 0 refills | Status: DC
  Filled 2022-06-21: qty 90, fill #0
  Filled 2022-07-26: qty 90, 90d supply, fill #0

## 2022-06-23 ENCOUNTER — Other Ambulatory Visit (HOSPITAL_COMMUNITY): Payer: Self-pay

## 2022-06-28 ENCOUNTER — Other Ambulatory Visit: Payer: Self-pay | Admitting: Physician Assistant

## 2022-06-28 ENCOUNTER — Other Ambulatory Visit (HOSPITAL_COMMUNITY): Payer: Self-pay

## 2022-06-28 DIAGNOSIS — E1169 Type 2 diabetes mellitus with other specified complication: Secondary | ICD-10-CM

## 2022-06-28 MED ORDER — FREESTYLE LITE TEST VI STRP
ORAL_STRIP | 1 refills | Status: DC
  Filled 2022-06-28: qty 100, 25d supply, fill #0

## 2022-06-30 ENCOUNTER — Other Ambulatory Visit (HOSPITAL_COMMUNITY): Payer: Self-pay

## 2022-07-18 ENCOUNTER — Encounter: Payer: Self-pay | Admitting: Nurse Practitioner

## 2022-07-19 ENCOUNTER — Other Ambulatory Visit: Payer: Self-pay

## 2022-07-19 ENCOUNTER — Other Ambulatory Visit (HOSPITAL_COMMUNITY): Payer: Self-pay

## 2022-07-19 DIAGNOSIS — E119 Type 2 diabetes mellitus without complications: Secondary | ICD-10-CM

## 2022-07-19 MED ORDER — BLOOD GLUCOSE METER KIT
PACK | 0 refills | Status: DC
  Filled 2022-07-19: qty 1, 90d supply, fill #0

## 2022-07-19 NOTE — Telephone Encounter (Signed)
Called pt she is advised of her Rx that was sent to pharmacy

## 2022-07-20 ENCOUNTER — Other Ambulatory Visit (HOSPITAL_COMMUNITY): Payer: Self-pay

## 2022-07-20 MED ORDER — GLUCOSE BLOOD VI STRP
ORAL_STRIP | 0 refills | Status: DC
  Filled 2022-07-20: qty 50, 87d supply, fill #0

## 2022-07-20 MED ORDER — FREESTYLE LANCETS MISC
0 refills | Status: DC
  Filled 2022-07-20: qty 100, 90d supply, fill #0

## 2022-07-21 ENCOUNTER — Other Ambulatory Visit (HOSPITAL_COMMUNITY): Payer: Self-pay

## 2022-07-23 ENCOUNTER — Other Ambulatory Visit: Payer: Self-pay | Admitting: Nurse Practitioner

## 2022-07-23 DIAGNOSIS — E1169 Type 2 diabetes mellitus with other specified complication: Secondary | ICD-10-CM

## 2022-07-24 ENCOUNTER — Other Ambulatory Visit (HOSPITAL_COMMUNITY): Payer: Self-pay

## 2022-07-24 MED ORDER — GLIPIZIDE 5 MG PO TABS
5.0000 mg | ORAL_TABLET | Freq: Every day | ORAL | 3 refills | Status: DC
  Filled 2022-07-24: qty 30, 30d supply, fill #0
  Filled 2022-08-24: qty 30, 30d supply, fill #1
  Filled 2022-09-24: qty 30, 30d supply, fill #2
  Filled 2022-10-18: qty 30, 30d supply, fill #3

## 2022-07-26 ENCOUNTER — Other Ambulatory Visit: Payer: Self-pay | Admitting: Physician Assistant

## 2022-07-27 ENCOUNTER — Other Ambulatory Visit (HOSPITAL_COMMUNITY): Payer: Self-pay

## 2022-07-28 ENCOUNTER — Other Ambulatory Visit (HOSPITAL_COMMUNITY): Payer: Self-pay

## 2022-08-02 ENCOUNTER — Other Ambulatory Visit (HOSPITAL_COMMUNITY): Payer: Self-pay

## 2022-08-03 ENCOUNTER — Other Ambulatory Visit (HOSPITAL_COMMUNITY): Payer: Self-pay

## 2022-08-03 ENCOUNTER — Telehealth: Payer: Self-pay

## 2022-08-03 NOTE — Telephone Encounter (Signed)
Patient called office to see if she could get ozempic '1mg'$  order sent to [facility] being that they are out of the ozempic '2mg'$ , please advise, thanks!

## 2022-08-04 ENCOUNTER — Other Ambulatory Visit (HOSPITAL_COMMUNITY): Payer: Self-pay

## 2022-08-04 ENCOUNTER — Other Ambulatory Visit: Payer: Self-pay

## 2022-08-04 DIAGNOSIS — E1169 Type 2 diabetes mellitus with other specified complication: Secondary | ICD-10-CM

## 2022-08-04 MED ORDER — SEMAGLUTIDE (2 MG/DOSE) 8 MG/3ML ~~LOC~~ SOPN
2.0000 mg | PEN_INJECTOR | SUBCUTANEOUS | 1 refills | Status: DC
  Filled 2022-08-04: qty 3, 28d supply, fill #0

## 2022-08-04 NOTE — Telephone Encounter (Signed)
Called pt she is advised of Rx that was sent to [facility]

## 2022-08-07 ENCOUNTER — Other Ambulatory Visit (HOSPITAL_COMMUNITY): Payer: Self-pay

## 2022-08-07 ENCOUNTER — Other Ambulatory Visit: Payer: Self-pay | Admitting: Nurse Practitioner

## 2022-08-07 DIAGNOSIS — E1169 Type 2 diabetes mellitus with other specified complication: Secondary | ICD-10-CM

## 2022-08-07 DIAGNOSIS — E119 Type 2 diabetes mellitus without complications: Secondary | ICD-10-CM

## 2022-08-07 MED ORDER — SEMAGLUTIDE (1 MG/DOSE) 4 MG/3ML ~~LOC~~ SOPN
1.0000 mg | PEN_INJECTOR | SUBCUTANEOUS | 3 refills | Status: DC
  Filled 2022-08-07 – 2022-08-14 (×2): qty 3, 28d supply, fill #0
  Filled 2022-08-24: qty 3, 28d supply, fill #1

## 2022-08-07 NOTE — Progress Notes (Signed)
Changed to semaglutide 1 mg weekly dose and sent to [facility]

## 2022-08-07 NOTE — Telephone Encounter (Signed)
Pt is requesting a 1 mg order of Semaglutide, 1 MG/DOSE due to the pharmacy not have the current dose of '2MG'$ .  Pharmacy: [facility]  Please advise

## 2022-08-07 NOTE — Telephone Encounter (Signed)
Please let the patient know that I changed to semaglutide 1 mg weekly dose and sent to [facility].  Thanks  -HB

## 2022-08-08 ENCOUNTER — Other Ambulatory Visit (HOSPITAL_COMMUNITY): Payer: Self-pay

## 2022-08-11 ENCOUNTER — Other Ambulatory Visit (HOSPITAL_COMMUNITY): Payer: Self-pay

## 2022-08-14 ENCOUNTER — Other Ambulatory Visit (HOSPITAL_COMMUNITY): Payer: Self-pay

## 2022-08-23 ENCOUNTER — Other Ambulatory Visit (HOSPITAL_COMMUNITY): Payer: Self-pay

## 2022-08-24 ENCOUNTER — Other Ambulatory Visit (HOSPITAL_COMMUNITY): Payer: Self-pay

## 2022-08-24 ENCOUNTER — Other Ambulatory Visit: Payer: Self-pay | Admitting: Nurse Practitioner

## 2022-08-24 DIAGNOSIS — F4323 Adjustment disorder with mixed anxiety and depressed mood: Secondary | ICD-10-CM

## 2022-08-25 ENCOUNTER — Other Ambulatory Visit (HOSPITAL_COMMUNITY): Payer: Self-pay

## 2022-08-25 MED ORDER — FLUOXETINE HCL 40 MG PO CAPS
40.0000 mg | ORAL_CAPSULE | Freq: Every day | ORAL | 0 refills | Status: DC
  Filled 2022-08-25: qty 90, 90d supply, fill #0

## 2022-09-18 ENCOUNTER — Telehealth: Payer: Self-pay | Admitting: *Deleted

## 2022-09-18 ENCOUNTER — Other Ambulatory Visit (HOSPITAL_COMMUNITY): Payer: Self-pay

## 2022-09-18 DIAGNOSIS — E1169 Type 2 diabetes mellitus with other specified complication: Secondary | ICD-10-CM

## 2022-09-18 MED ORDER — SEMAGLUTIDE (1 MG/DOSE) 4 MG/3ML ~~LOC~~ SOPN
1.0000 mg | PEN_INJECTOR | SUBCUTANEOUS | 0 refills | Status: DC
  Filled 2022-09-18: qty 3, 28d supply, fill #0

## 2022-09-18 NOTE — Telephone Encounter (Signed)
30 day supply sent. Office visit required for refills.

## 2022-09-18 NOTE — Telephone Encounter (Signed)
Pt calls and said pharmacy now has Ozempic and she needs a new Rx for the Ozempic 2 mg sent into the [facility] Ahlam Piscitelli Zimmerman Rumple, CMA

## 2022-09-19 ENCOUNTER — Other Ambulatory Visit (HOSPITAL_COMMUNITY): Payer: Self-pay

## 2022-09-24 ENCOUNTER — Other Ambulatory Visit: Payer: Self-pay | Admitting: Physician Assistant

## 2022-09-24 ENCOUNTER — Other Ambulatory Visit (HOSPITAL_COMMUNITY): Payer: Self-pay

## 2022-09-24 ENCOUNTER — Encounter: Payer: Self-pay | Admitting: Nurse Practitioner

## 2022-09-24 DIAGNOSIS — E039 Hypothyroidism, unspecified: Secondary | ICD-10-CM

## 2022-09-25 ENCOUNTER — Other Ambulatory Visit (HOSPITAL_COMMUNITY): Payer: Self-pay

## 2022-09-25 ENCOUNTER — Other Ambulatory Visit: Payer: Self-pay | Admitting: Nurse Practitioner

## 2022-09-25 ENCOUNTER — Encounter: Payer: Self-pay | Admitting: Hematology

## 2022-09-25 DIAGNOSIS — E1169 Type 2 diabetes mellitus with other specified complication: Secondary | ICD-10-CM

## 2022-09-25 MED ORDER — LISINOPRIL 40 MG PO TABS
40.0000 mg | ORAL_TABLET | Freq: Every day | ORAL | 0 refills | Status: DC
  Filled 2022-09-25: qty 30, 30d supply, fill #0

## 2022-09-25 MED ORDER — SYNTHROID 100 MCG PO TABS
100.0000 ug | ORAL_TABLET | Freq: Every day | ORAL | 0 refills | Status: DC
  Filled 2022-09-25: qty 30, 30d supply, fill #0

## 2022-09-25 MED ORDER — SEMAGLUTIDE (2 MG/DOSE) 8 MG/3ML ~~LOC~~ SOPN
2.0000 mg | PEN_INJECTOR | SUBCUTANEOUS | 5 refills | Status: DC
  Filled 2022-09-25 – 2022-10-13 (×2): qty 3, 28d supply, fill #0
  Filled 2022-11-12: qty 3, 28d supply, fill #1
  Filled 2022-11-13: qty 3, 28d supply, fill #0
  Filled 2022-12-18: qty 3, 28d supply, fill #1
  Filled 2023-01-16: qty 3, 28d supply, fill #2
  Filled 2023-02-13: qty 3, 28d supply, fill #3
  Filled 2023-03-11 – 2023-03-20 (×3): qty 3, 28d supply, fill #4

## 2022-09-25 MED ORDER — LIOTHYRONINE SODIUM 5 MCG PO TABS
ORAL_TABLET | ORAL | 0 refills | Status: DC
  Filled 2022-09-25 – 2022-10-24 (×2): qty 90, 30d supply, fill #0

## 2022-09-25 NOTE — Telephone Encounter (Signed)
Office visit required for refills.

## 2022-09-30 ENCOUNTER — Encounter: Payer: Self-pay | Admitting: Hematology

## 2022-10-13 ENCOUNTER — Other Ambulatory Visit: Payer: Self-pay

## 2022-10-18 ENCOUNTER — Other Ambulatory Visit: Payer: Self-pay | Admitting: Nurse Practitioner

## 2022-10-18 ENCOUNTER — Other Ambulatory Visit (HOSPITAL_COMMUNITY): Payer: Self-pay

## 2022-10-18 DIAGNOSIS — E039 Hypothyroidism, unspecified: Secondary | ICD-10-CM

## 2022-10-19 ENCOUNTER — Other Ambulatory Visit: Payer: Self-pay

## 2022-10-23 ENCOUNTER — Encounter: Payer: Self-pay | Admitting: Hematology

## 2022-10-23 ENCOUNTER — Other Ambulatory Visit (HOSPITAL_COMMUNITY): Payer: Self-pay

## 2022-10-24 ENCOUNTER — Other Ambulatory Visit: Payer: Self-pay | Admitting: Nurse Practitioner

## 2022-10-24 ENCOUNTER — Other Ambulatory Visit (HOSPITAL_COMMUNITY): Payer: Self-pay

## 2022-10-24 ENCOUNTER — Other Ambulatory Visit: Payer: Self-pay

## 2022-10-24 DIAGNOSIS — E039 Hypothyroidism, unspecified: Secondary | ICD-10-CM

## 2022-10-25 ENCOUNTER — Other Ambulatory Visit: Payer: Self-pay

## 2022-10-29 ENCOUNTER — Encounter: Payer: Self-pay | Admitting: Nurse Practitioner

## 2022-11-06 ENCOUNTER — Other Ambulatory Visit: Payer: Self-pay | Admitting: Nurse Practitioner

## 2022-11-06 ENCOUNTER — Encounter: Payer: Self-pay | Admitting: Nurse Practitioner

## 2022-11-06 DIAGNOSIS — E039 Hypothyroidism, unspecified: Secondary | ICD-10-CM

## 2022-11-07 ENCOUNTER — Other Ambulatory Visit: Payer: Self-pay

## 2022-11-07 ENCOUNTER — Other Ambulatory Visit (HOSPITAL_COMMUNITY): Payer: Self-pay

## 2022-11-07 ENCOUNTER — Telehealth: Payer: Self-pay | Admitting: *Deleted

## 2022-11-07 ENCOUNTER — Other Ambulatory Visit: Payer: Self-pay | Admitting: Nurse Practitioner

## 2022-11-07 DIAGNOSIS — E039 Hypothyroidism, unspecified: Secondary | ICD-10-CM

## 2022-11-07 DIAGNOSIS — I152 Hypertension secondary to endocrine disorders: Secondary | ICD-10-CM

## 2022-11-07 MED ORDER — HYDROCHLOROTHIAZIDE 25 MG PO TABS
25.0000 mg | ORAL_TABLET | Freq: Every day | ORAL | 1 refills | Status: DC
  Filled 2022-11-09: qty 90, 90d supply, fill #0

## 2022-11-07 MED ORDER — SYNTHROID 100 MCG PO TABS
100.0000 ug | ORAL_TABLET | Freq: Every day | ORAL | 0 refills | Status: DC
  Filled 2022-11-07: qty 30, 30d supply, fill #0

## 2022-11-07 MED ORDER — LISINOPRIL 40 MG PO TABS
40.0000 mg | ORAL_TABLET | Freq: Every day | ORAL | 1 refills | Status: DC
  Filled 2022-11-09: qty 90, 90d supply, fill #0
  Filled 2023-02-13: qty 90, 90d supply, fill #1

## 2022-11-07 MED ORDER — SYNTHROID 100 MCG PO TABS
100.0000 ug | ORAL_TABLET | Freq: Every day | ORAL | 1 refills | Status: DC
  Filled 2022-11-09: qty 90, 90d supply, fill #0
  Filled 2023-02-13: qty 90, 90d supply, fill #1

## 2022-11-07 MED ORDER — LISINOPRIL 40 MG PO TABS
40.0000 mg | ORAL_TABLET | Freq: Every day | ORAL | 0 refills | Status: DC
  Filled 2022-11-07: qty 30, 30d supply, fill #0

## 2022-11-07 MED ORDER — HYDROCHLOROTHIAZIDE 25 MG PO TABS
25.0000 mg | ORAL_TABLET | Freq: Every day | ORAL | 0 refills | Status: DC
  Filled 2022-11-07: qty 60, 60d supply, fill #0

## 2022-11-07 NOTE — Telephone Encounter (Signed)
Pt called to schedule an appointment.  She had sent message for refills and she was told she needed an appointment.  It is scheduled on 01/05/23. Please send in medication to last until that appointment.       SYNTHROID 100 MCG tablet   lisinopril (ZESTRIL) 40 MG tablet   hydrochlorothiazide (HYDRODIURIL) 25 MG tablet    [facility] [address] [name] [address]

## 2022-11-07 NOTE — Telephone Encounter (Signed)
Rx's has been sent to [facility]

## 2022-11-07 NOTE — Telephone Encounter (Signed)
Rx's has been sent to [facility]

## 2022-11-07 NOTE — Telephone Encounter (Signed)
I have refilled these and sent to [facility]

## 2022-11-08 ENCOUNTER — Other Ambulatory Visit (HOSPITAL_COMMUNITY): Payer: Self-pay

## 2022-11-08 ENCOUNTER — Other Ambulatory Visit: Payer: Self-pay

## 2022-11-09 ENCOUNTER — Other Ambulatory Visit (HOSPITAL_COMMUNITY): Payer: Self-pay

## 2022-11-13 ENCOUNTER — Other Ambulatory Visit: Payer: Self-pay

## 2022-11-13 ENCOUNTER — Encounter: Payer: Self-pay | Admitting: Hematology

## 2022-11-13 ENCOUNTER — Other Ambulatory Visit (HOSPITAL_COMMUNITY): Payer: Self-pay

## 2022-11-15 ENCOUNTER — Telehealth: Payer: Medicare Other | Admitting: Physician Assistant

## 2022-11-15 DIAGNOSIS — R3989 Other symptoms and signs involving the genitourinary system: Secondary | ICD-10-CM | POA: Diagnosis not present

## 2022-11-15 MED ORDER — CEPHALEXIN 500 MG PO CAPS: 500.0000 mg | ORAL_CAPSULE | Freq: Two times a day (BID) | ORAL | 0 refills | Status: DC

## 2022-11-15 NOTE — Progress Notes (Signed)

## 2022-11-22 ENCOUNTER — Other Ambulatory Visit (HOSPITAL_COMMUNITY): Payer: Self-pay

## 2022-11-30 ENCOUNTER — Telehealth: Payer: Medicare Other | Admitting: Family Medicine

## 2022-11-30 ENCOUNTER — Other Ambulatory Visit: Payer: Self-pay

## 2022-11-30 ENCOUNTER — Other Ambulatory Visit (HOSPITAL_COMMUNITY): Payer: Self-pay

## 2022-11-30 DIAGNOSIS — T3695XA Adverse effect of unspecified systemic antibiotic, initial encounter: Secondary | ICD-10-CM | POA: Diagnosis not present

## 2022-11-30 DIAGNOSIS — B379 Candidiasis, unspecified: Secondary | ICD-10-CM

## 2022-11-30 MED ORDER — FLUCONAZOLE 150 MG PO TABS
150.0000 mg | ORAL_TABLET | Freq: Every day | ORAL | 0 refills | Status: DC
  Filled 2022-11-30 (×2): qty 1, 1d supply, fill #0

## 2022-11-30 NOTE — Progress Notes (Signed)

## 2022-12-01 ENCOUNTER — Other Ambulatory Visit: Payer: Self-pay

## 2022-12-04 ENCOUNTER — Encounter: Payer: Self-pay | Admitting: Nurse Practitioner

## 2022-12-04 ENCOUNTER — Other Ambulatory Visit (HOSPITAL_COMMUNITY): Payer: Self-pay

## 2022-12-04 ENCOUNTER — Other Ambulatory Visit: Payer: Self-pay

## 2022-12-04 ENCOUNTER — Other Ambulatory Visit: Payer: Self-pay | Admitting: Nurse Practitioner

## 2022-12-04 DIAGNOSIS — F4323 Adjustment disorder with mixed anxiety and depressed mood: Secondary | ICD-10-CM

## 2022-12-04 MED ORDER — FLUOXETINE HCL 40 MG PO CAPS
40.0000 mg | ORAL_CAPSULE | Freq: Every day | ORAL | 0 refills | Status: DC
  Filled 2022-12-04: qty 90, 90d supply, fill #0

## 2022-12-06 ENCOUNTER — Encounter: Payer: Self-pay | Admitting: Hematology

## 2022-12-06 ENCOUNTER — Other Ambulatory Visit (HOSPITAL_COMMUNITY): Payer: Self-pay

## 2022-12-06 DIAGNOSIS — L821 Other seborrheic keratosis: Secondary | ICD-10-CM | POA: Diagnosis not present

## 2022-12-06 DIAGNOSIS — L814 Other melanin hyperpigmentation: Secondary | ICD-10-CM | POA: Diagnosis not present

## 2022-12-06 DIAGNOSIS — L905 Scar conditions and fibrosis of skin: Secondary | ICD-10-CM | POA: Diagnosis not present

## 2022-12-06 DIAGNOSIS — D2372 Other benign neoplasm of skin of left lower limb, including hip: Secondary | ICD-10-CM | POA: Diagnosis not present

## 2022-12-06 MED ORDER — VALACYCLOVIR HCL 500 MG PO TABS
500.0000 mg | ORAL_TABLET | Freq: Two times a day (BID) | ORAL | 3 refills | Status: AC | PRN
  Filled 2022-12-06: qty 30, 15d supply, fill #0
  Filled 2023-03-24: qty 30, 15d supply, fill #1

## 2022-12-07 ENCOUNTER — Encounter (HOSPITAL_COMMUNITY): Payer: Self-pay

## 2022-12-07 ENCOUNTER — Other Ambulatory Visit (HOSPITAL_COMMUNITY): Payer: Self-pay

## 2022-12-08 ENCOUNTER — Encounter: Payer: Self-pay | Admitting: Hematology

## 2022-12-18 ENCOUNTER — Other Ambulatory Visit (HOSPITAL_COMMUNITY): Payer: Self-pay

## 2022-12-27 ENCOUNTER — Other Ambulatory Visit: Payer: Self-pay

## 2022-12-29 ENCOUNTER — Encounter: Payer: Self-pay | Admitting: Hematology

## 2023-01-04 NOTE — Progress Notes (Addendum)
Complete physical exam  Patient: Robin Greene   DOB: [birth year]   67 y.o. Female  MRN: [id number]  Subjective:    Chief Complaint  Patient presents with   Annual Exam    fasting    Robin Greene is a 67 y.o. female who presents today for a complete physical exam. She reports consuming a low sodium and low carb  diet. She generally feels fairly well, but she is still grieving the loss of her mother which happened fairly recently.  She does not have additional problems to discuss today.  She has been limiting her dairy and greasy foods to help with flatulence and has found some improvement.   Most recent fall risk assessment:    08/12/2021    9:28 AM  Fall Risk   Falls in the past year? 0  Number falls in past yr: 0  Injury with Fall? 0  Follow up Falls evaluation completed     Most recent depression screenings:    01/05/2023   11:04 AM 01/05/2023   11:02 AM  PHQ 2/9 Scores  Exception Documentation Patient refusal Patient refusal    Patient Active Problem List   Diagnosis Date Noted   Generalized abdominal tenderness without rebound tenderness 04/05/2022   Hypercalcemia 04/05/2022   Non-ulcerative nasal mucositis 04/05/2022   Hiatal hernia 09/13/2021   Elevated TSH 09/25/2019   Statin intolerance 09/25/2019   Trigger finger, right ring finger 04/09/2018   Tenosynovitis of finger and hand 04/04/2018   Dysfunction of both eustachian tubes 04/04/2018   Postmenopausal 07/18/2017   Acute stress reaction 10/30/2016   Hypothyroidism 09/27/2016   Vitamin D deficiency 09/27/2016   Essential hypertension 09/27/2016   HSV-1 infection 09/27/2016   Anemia 09/27/2016   Overweight (BMI 25.0-29.9) 09/27/2016   Environmental and seasonal allergies 09/27/2016   Hyperlipidemia associated with type 2 diabetes mellitus ([place]) 09/27/2016   Type 2 diabetes mellitus with other specified complication ([place]) [id number]    Past Surgical History:  Procedure Laterality Date   ABDOMINAL  HYSTERECTOMY     EYE SURGERY     INSERTION OF MESH N/A 04/30/2017   Procedure: INSERTION OF MESH;  Surgeon: Jackolyn Confer, MD;  Location: [facility];  Service: General;  Laterality: N/A;   INSERTION OF MESH N/A 09/13/2021   Procedure: INSERTION OF MESH;  Surgeon: Ralene Ok, MD;  Location: [facility];  Service: General;  Laterality: N/A;   OVARY SURGERY     REFRACTIVE SURGERY     UMBILICAL HERNIA REPAIR N/A 04/30/2017   Procedure: UMBILICAL HERNIA REPAIR WITH MESH;  Surgeon: Jackolyn Confer, MD;  Location: [facility];  Service: General;  Laterality: N/A;   XI ROBOTIC ASSISTED HIATAL HERNIA REPAIR N/A 09/13/2021   Procedure: XI ROBOTIC ASSISTED HIATAL [place];  Surgeon: Ralene Ok, MD;  Location: [facility];  Service: General;  Laterality: N/A;   Social History   Tobacco Use   Smoking status: Never   Smokeless tobacco: Never  Vaping Use   Vaping Use: Never used  Substance Use Topics   Alcohol use: Yes    Comment: occasionally, a glass of wine a couple of times a month   Drug use: No   Family History  Problem Relation Age of Onset   Sjogren's syndrome Mother    Parkinson's disease Mother    Hypertension Father    Hyperlipidemia Father    Healthy Sister    Allergies  Allergen Reactions   Asa [Aspirin] Anaphylaxis  Crestor [Rosuvastatin Calcium] Other (See Comments)    Abnormal LFTs   Claritin [Loratadine] Other (See Comments)    Fainted twice    Invokana [Canagliflozin] Other (See Comments)    Genital yeast infections   Jardiance [Empagliflozin] Other (See Comments)    Genital yeast infection   Lipitor [Atorvastatin] Other (See Comments)    Muscle aches    Simvastatin Other (See Comments)    Muscle aches     Patient Care Team: Lorrene Reid, PA-C (Inactive) as PCP - Windle Guard, MD as Consulting Physician (Gastroenterology) Delrae Rend, MD as Consulting Physician (Endocrinology) Druscilla Brownie, MD as Consulting  Physician (Dermatology) Arvella Nigh, MD as Consulting Physician (Obstetrics and Gynecology) Vevelyn Royals, MD as Consulting Physician (Ophthalmology)   Outpatient Medications Prior to Visit  Medication Sig   bifidobacterium infantis (ALIGN) capsule Take 1 capsule by mouth 3 (three) times a week.   blood glucose meter kit and supplies Use to check blood glucose three to four times weekly as directed.   Blood Pressure Monitoring (OMRON 3 SERIES BP MONITOR) DEVI Use as directed   Cholecalciferol (VITAMIN D3 PO) Take 1 tablet by mouth daily.   clobetasol cream (TEMOVATE) 0.05 % Apply topically 2 times daily.   FLUoxetine (PROZAC) 40 MG capsule Take 1 capsule (40 mg total) by mouth daily.   fluticasone (FLONASE) 50 MCG/ACT nasal spray Place 1 spray into both nostrils daily as needed for allergies or rhinitis.   glipiZIDE (GLUCOTROL) 5 MG tablet Take 1 tablet (5 mg total) by mouth daily before breakfast.   Lancets (FREESTYLE) lancets USE TO CHECK BLOOD SUGAR 3 TO 4 TIMES A WEEK AS DIRECTED   lisinopril (ZESTRIL) 40 MG tablet Take 1 tablet (40 mg total) by mouth daily.   mupirocin ointment (BACTROBAN) 2 % Apply 1 application into the nose 2 (two) times daily.   Semaglutide, 2 MG/DOSE, 8 MG/3ML SOPN Inject 2 mg as directed once a week.   SYNTHROID 100 MCG tablet Take 1 tablet (100 mcg total) by mouth daily before breakfast.   valACYclovir (VALTREX) 500 MG tablet Take 1 tablet (500 mg total) by mouth 2 (two) times daily for 3 to 5 days as needed for flares.   [DISCONTINUED] fluconazole (DIFLUCAN) 150 MG tablet Take 1 tablet (150 mg total) by mouth daily.   [DISCONTINUED] hydrochlorothiazide (HYDRODIURIL) 25 MG tablet Take 1 tablet (25 mg total) by mouth daily.   [DISCONTINUED] liothyronine (CYTOMEL) 5 MCG tablet Take 2 tablets by mouth each morning and 1 tablet daily in the afternoon.   [DISCONTINUED] metFORMIN (GLUCOPHAGE) 1000 MG tablet Take 0.5 tablets (500 mg total) by mouth 2 (two) times  daily with a meal.   [DISCONTINUED] cephALEXin (KEFLEX) 500 MG capsule Take 1 capsule (500 mg total) by mouth 2 (two) times daily.   [DISCONTINUED] ezetimibe (ZETIA) 10 MG tablet TAKE 1 TABLET BY MOUTH ONCE DAILY   No facility-administered medications prior to visit.    Review of Systems  Constitutional:  Negative for chills, fever and malaise/fatigue.  HENT:  Positive for congestion (Seasonal allergies). Negative for hearing loss.        Positive for dry mouth  Eyes:  Negative for blurred vision and double vision.  Respiratory:  Negative for cough and shortness of breath.   Cardiovascular:  Negative for chest pain, palpitations and leg swelling.  Gastrointestinal:  Negative for abdominal pain, constipation, diarrhea and heartburn.  Genitourinary:  Negative for frequency and urgency.  Musculoskeletal:  Negative for myalgias and neck pain.  Neurological:  Negative for headaches.  Endo/Heme/Allergies:  Negative for polydipsia.  Psychiatric/Behavioral:  Negative for depression. The patient is not nervous/anxious.        Positive for grief, did not want to complete PHQ-9       Objective:    BP (!) 153/80 (BP Location: Left Arm, Patient Position: Sitting, Cuff Size: Normal)   Pulse 71   Resp 18   Ht 5' 4.96" (1.65 m)   Wt 163 lb (73.9 kg)   LMP  (LMP Unknown)   SpO2 97%   BMI 27.16 kg/m    Physical Exam Constitutional:      General: She is not in acute distress.    Appearance: Normal appearance.  HENT:     Head: Normocephalic and atraumatic.     Right Ear: Tympanic membrane and ear canal normal.     Left Ear: Tympanic membrane and ear canal normal.     Nose: Nose normal.     Mouth/Throat:     Mouth: Mucous membranes are moist.     Pharynx: No oropharyngeal exudate or posterior oropharyngeal erythema.  Eyes:     Extraocular Movements: Extraocular movements intact.     Pupils: Pupils are equal, round, and reactive to light.  Neck:     Thyroid: No thyromegaly or thyroid  tenderness.     Trachea: Trachea normal.  Cardiovascular:     Rate and Rhythm: Normal rate and regular rhythm.     Pulses:          Dorsalis pedis pulses are 2+ on the right side and 2+ on the left side.       Posterior tibial pulses are 2+ on the right side and 2+ on the left side.     Heart sounds: Murmur heard.     No friction rub. No gallop.  Pulmonary:     Effort: Pulmonary effort is normal.     Breath sounds: Normal breath sounds.  Abdominal:     General: Abdomen is flat. Bowel sounds are normal.  Musculoskeletal:        General: Normal range of motion.     Cervical back: Normal range of motion and neck supple. No tenderness.  Feet:     Right foot:     Protective Sensation: 10 sites tested.  10 sites sensed.     Skin integrity: Skin integrity normal.     Toenail Condition: Right toenails are normal.     Left foot:     Protective Sensation: 10 sites tested.  10 sites sensed.     Skin integrity: Skin integrity normal.     Toenail Condition: Left toenails are normal.  Lymphadenopathy:     Cervical: No cervical adenopathy.  Skin:    General: Skin is warm and dry.  Neurological:     Mental Status: She is alert and oriented to person, place, and time.  Psychiatric:        Mood and Affect: Mood normal.     Results for orders placed or performed in visit on 01/05/23  POCT UA - Microalbumin  Result Value Ref Range   Microalbumin Ur, POC 150 mg/L   Creatinine, POC 50 mg/dL   Albumin/Creatinine Ratio, Urine, POC >300      Assessment & Plan:    Routine Health Maintenance and Physical Exam  Immunization History  Administered Date(s) Administered   Influenza Split 08/18/2010, 07/17/2011, 07/16/2014, 08/02/2016   Influenza Whole 07/23/2017   Influenza, High Dose Seasonal PF 08/14/2022   Influenza-Unspecified  08/02/2016, 08/04/2019, 08/10/2021   Pneumococcal Conjugate-13 09/19/2017   Pneumococcal Polysaccharide-23 08/30/2010   Td 10/16/1998   Tdap 05/20/2014   Zoster  Recombinat (Shingrix) 04/12/2018, 08/29/2018   Zoster, Live 05/20/2014    Health Maintenance  Topic Date Due   Medicare Annual Wellness (AWV)  Never done   COVID-19 Vaccine (1) Never done   OPHTHALMOLOGY EXAM  Never done   DEXA SCAN  Never done   FOOT EXAM  05/18/2022   Pneumonia Vaccine 30+ Years old (3 of 3 - PPSV23 or PCV20) 09/19/2022   HEMOGLOBIN A1C  10/05/2022   Diabetic kidney evaluation - eGFR measurement  04/06/2023   MAMMOGRAM  08/06/2023   Diabetic kidney evaluation - Urine ACR  01/05/2024   DTaP/Tdap/Td (3 - Td or Tdap) 05/20/2024   COLONOSCOPY (Pts 45-15yrs Insurance coverage will need to be confirmed)  07/09/2031   INFLUENZA VACCINE  Completed   Hepatitis C Screening  Completed   Zoster Vaccines- Shingrix  Completed   HPV VACCINES  Aged Out    Discussed health benefits of physical activity, and encouraged her to engage in regular exercise appropriate for her age and condition.  Wellness examination -     CBC with Differential/Platelet; Future -     Comprehensive metabolic panel; Future -     Hemoglobin A1c; Future -     Lipid panel; Future -     TSH; Future -     T4, free; Future -     VITAMIN D 25 Hydroxy (Vit-D Deficiency, Fractures); Future  Type 2 diabetes mellitus with other specified complication, unspecified whether long term insulin use (HCC) Assessment & Plan: Last A1c 6.9, within goal.  Rechecking A1c today, collected urine microalbumin and performed foot exam.  Continue Ozempic 2 mg weekly.  Will adjust medication regimen as indicated by lab results.  Will continue to monitor.  Requesting results of diabetic eye exam.  Orders: -     Hemoglobin A1c; Future -     POCT UA - Microalbumin -     metFORMIN HCl; Take 0.5 tablets (500 mg total) by mouth 2 (two) times daily with a meal.  Dispense: 180 tablet; Refill: 1  Hypothyroidism, unspecified type Assessment & Plan: Last T4 within normal limits.  Repeat TSH and T4 today.  Will adjust medications as  indicated by lab results.  Orders: -     TSH; Future -     T4, free; Future -     Liothyronine Sodium; Take 2 tablets by mouth each morning and 1 tablet daily in the afternoon.  Dispense: 90 tablet; Refill: 2  Vitamin D deficiency Assessment & Plan: Rechecking vitamin D levels today, she is still taking a maintenance dose.  Orders: -     VITAMIN D 25 Hydroxy (Vit-D Deficiency, Fractures); Future  Hypertension associated with diabetes ([place])-  with superimposed white coat HTN Assessment & Plan: Patient does have some degree of whitecoat syndrome with BP elevated 161/99 initially, remained elevated 153/80 on repeat.  Patient states typically runs 130s/70-80s at home but can run higher.  Recommend increasing hydrochlorothiazide to 50 mg.  Patient will keep blood pressure log for 2 weeks and come back for blood pressure nurse visit at that time.  CMP repeated today, will continue to monitor.  Orders: -     CBC with Differential/Platelet; Future -     Comprehensive metabolic panel; Future -     hydroCHLOROthiazide; Take 1 tablet (50 mg total) by mouth daily.  Dispense: 90 tablet; Refill:  1  Hyperlipidemia associated with type 2 diabetes mellitus ([place]) Assessment & Plan: Repeating lipid panel today.  Continue Zetia 10 mg daily unless lab results warrant change in medication.  Orders: -     Lipid panel; Future -     Ezetimibe; TAKE 1 TABLET BY MOUTH ONCE DAILY  Dispense: 90 tablet; Refill: 1  Xerostomia -     ANA -     Sjogrens syndrome-A extractable nuclear antibody -     Sjogrens syndrome-B extractable nuclear antibody    Return in about 3 months (around 04/07/2023) for follow-up.     Velva Harman, PA

## 2023-01-05 ENCOUNTER — Other Ambulatory Visit: Payer: Self-pay

## 2023-01-05 ENCOUNTER — Ambulatory Visit (INDEPENDENT_AMBULATORY_CARE_PROVIDER_SITE_OTHER): Payer: Medicare Other | Admitting: Family Medicine

## 2023-01-05 ENCOUNTER — Encounter: Payer: Self-pay | Admitting: Family Medicine

## 2023-01-05 VITALS — BP 153/80 | HR 71 | Resp 18 | Ht 64.96 in | Wt 163.0 lb

## 2023-01-05 DIAGNOSIS — E039 Hypothyroidism, unspecified: Secondary | ICD-10-CM

## 2023-01-05 DIAGNOSIS — I152 Hypertension secondary to endocrine disorders: Secondary | ICD-10-CM

## 2023-01-05 DIAGNOSIS — Z0000 Encounter for general adult medical examination without abnormal findings: Secondary | ICD-10-CM

## 2023-01-05 DIAGNOSIS — E559 Vitamin D deficiency, unspecified: Secondary | ICD-10-CM | POA: Diagnosis not present

## 2023-01-05 DIAGNOSIS — E1169 Type 2 diabetes mellitus with other specified complication: Secondary | ICD-10-CM

## 2023-01-05 DIAGNOSIS — E1159 Type 2 diabetes mellitus with other circulatory complications: Secondary | ICD-10-CM

## 2023-01-05 DIAGNOSIS — K117 Disturbances of salivary secretion: Secondary | ICD-10-CM

## 2023-01-05 DIAGNOSIS — E785 Hyperlipidemia, unspecified: Secondary | ICD-10-CM

## 2023-01-05 LAB — POCT UA - MICROALBUMIN
Albumin/Creatinine Ratio, Urine, POC: >300
Creatinine, POC: 50 mg/dL
Microalbumin Ur, POC: 150 mg/L

## 2023-01-05 MED ORDER — EZETIMIBE 10 MG PO TABS
10.0000 mg | ORAL_TABLET | Freq: Every day | ORAL | 1 refills | Status: DC
  Filled 2023-01-05: qty 90, 90d supply, fill #0

## 2023-01-05 MED ORDER — HYDROCHLOROTHIAZIDE 50 MG PO TABS
50.0000 mg | ORAL_TABLET | Freq: Every day | ORAL | 1 refills | Status: DC
  Filled 2023-01-05: qty 90, 90d supply, fill #0
  Filled 2023-05-01: qty 90, 90d supply, fill #1

## 2023-01-05 MED ORDER — METFORMIN HCL 1000 MG PO TABS
500.0000 mg | ORAL_TABLET | Freq: Two times a day (BID) | ORAL | 1 refills | Status: DC
  Filled 2023-01-05: qty 90, 90d supply, fill #0
  Filled 2023-03-20: qty 90, 90d supply, fill #1

## 2023-01-05 MED ORDER — LIOTHYRONINE SODIUM 5 MCG PO TABS
ORAL_TABLET | ORAL | 2 refills | Status: DC
  Filled 2023-01-05 – 2023-01-09 (×2): qty 90, 30d supply, fill #0
  Filled 2023-02-13: qty 90, 30d supply, fill #1
  Filled 2023-03-11: qty 90, 30d supply, fill #2

## 2023-01-05 NOTE — Assessment & Plan Note (Signed)
Rechecking vitamin D levels today, she is still taking a maintenance dose.

## 2023-01-05 NOTE — Assessment & Plan Note (Signed)
Patient does have some degree of whitecoat syndrome with BP elevated 161/99 initially, remained elevated 153/80 on repeat.  Patient states typically runs 130s/70-80s at home but can run higher.  Recommend increasing hydrochlorothiazide to 50 mg.  Patient will keep blood pressure log for 2 weeks and come back for blood pressure nurse visit at that time.  CMP repeated today, will continue to monitor.

## 2023-01-05 NOTE — Assessment & Plan Note (Signed)
Repeating lipid panel today.  Continue Zetia 10 mg daily unless lab results warrant change in medication.

## 2023-01-05 NOTE — Assessment & Plan Note (Signed)
Last A1c 6.9, within goal.  Rechecking A1c today, collected urine microalbumin and performed foot exam.  Continue Ozempic 2 mg weekly.  Will adjust medication regimen as indicated by lab results.  Will continue to monitor.  Requesting results of diabetic eye exam.

## 2023-01-05 NOTE — Assessment & Plan Note (Signed)
Last T4 within normal limits.  Repeat TSH and T4 today.  Will adjust medications as indicated by lab results.

## 2023-01-05 NOTE — Patient Instructions (Addendum)
I will do some research and put the lab orders in for Sjogren's and any Parkinson's test that we can do.  You can stop by at your convenience to have them done, or do them the same day as your blood pressure check.  We will increase your hydrochlorothiazide, keep checking your blood pressure at home and keep track of what it is running the next couple weeks before your blood pressure check.  It is recommended for you to get the most recent pneumonia vaccine, which is called the PCV 20.  You can get that at any local pharmacy.  It was nice to meet you today!

## 2023-01-06 LAB — LIPID PANEL
Chol/HDL Ratio: 4.5 ratio — ABNORMAL HIGH (ref 0.0–4.4)
Cholesterol, Total: 223 mg/dL — ABNORMAL HIGH (ref 100–199)
HDL: 50 mg/dL (ref >39)
LDL Chol Calc (NIH): 133 mg/dL — ABNORMAL HIGH (ref 0–99)
Triglycerides: 223 mg/dL — ABNORMAL HIGH (ref 0–149)
VLDL Cholesterol Cal: 40 mg/dL (ref 5–40)

## 2023-01-06 LAB — COMPREHENSIVE METABOLIC PANEL
ALT: 34 IU/L — ABNORMAL HIGH (ref 0–32)
AST: 30 IU/L (ref 0–40)
Albumin/Globulin Ratio: 1.6 (ref 1.2–2.2)
Albumin: 4.1 g/dL (ref 3.9–4.9)
Alkaline Phosphatase: 123 IU/L — ABNORMAL HIGH (ref 44–121)
BUN/Creatinine Ratio: 13 (ref 12–28)
BUN: 10 mg/dL (ref 8–27)
Bilirubin Total: 0.5 mg/dL (ref 0.0–1.2)
CO2: 20 mmol/L (ref 20–29)
Calcium: 9.7 mg/dL (ref 8.7–10.3)
Chloride: 96 mmol/L (ref 96–106)
Creatinine, Ser: 0.8 mg/dL (ref 0.57–1.00)
Globulin, Total: 2.6 g/dL (ref 1.5–4.5)
Glucose: 281 mg/dL — ABNORMAL HIGH (ref 70–99)
Potassium: 4.2 mmol/L (ref 3.5–5.2)
Sodium: 134 mmol/L (ref 134–144)
Total Protein: 6.7 g/dL (ref 6.0–8.5)
eGFR: 80 mL/min/{1.73_m2} (ref >59)

## 2023-01-06 LAB — CBC WITH DIFFERENTIAL/PLATELET
Basophils Absolute: 0.1 10*3/uL (ref 0.0–0.2)
Basos: 1 %
EOS (ABSOLUTE): 0.5 10*3/uL — ABNORMAL HIGH (ref 0.0–0.4)
Eos: 6 %
Hematocrit: 40.6 % (ref 34.0–46.6)
Hemoglobin: 13.5 g/dL (ref 11.1–15.9)
Immature Grans (Abs): 0 10*3/uL (ref 0.0–0.1)
Immature Granulocytes: 0 %
Lymphocytes Absolute: 1.3 10*3/uL (ref 0.7–3.1)
Lymphs: 16 %
MCH: 28.5 pg (ref 26.6–33.0)
MCHC: 33.3 g/dL (ref 31.5–35.7)
MCV: 86 fL (ref 79–97)
Monocytes Absolute: 0.4 10*3/uL (ref 0.1–0.9)
Monocytes: 5 %
Neutrophils Absolute: 5.7 10*3/uL (ref 1.4–7.0)
Neutrophils: 72 %
Platelets: 323 10*3/uL (ref 150–450)
RBC: 4.73 x10E6/uL (ref 3.77–5.28)
RDW: 13.1 % (ref 11.7–15.4)
WBC: 7.9 10*3/uL (ref 3.4–10.8)

## 2023-01-06 LAB — TSH: TSH: 0.32 u[IU]/mL — ABNORMAL LOW (ref 0.450–4.500)

## 2023-01-06 LAB — T4, FREE: Free T4: 1.32 ng/dL (ref 0.82–1.77)

## 2023-01-06 LAB — HEMOGLOBIN A1C
Est. average glucose Bld gHb Est-mCnc: 203 mg/dL
Hgb A1c MFr Bld: 8.7 % — ABNORMAL HIGH (ref 4.8–5.6)

## 2023-01-06 LAB — VITAMIN D 25 HYDROXY (VIT D DEFICIENCY, FRACTURES): Vit D, 25-Hydroxy: 29.5 ng/mL — ABNORMAL LOW (ref 30.0–100.0)

## 2023-01-08 ENCOUNTER — Encounter: Payer: Self-pay | Admitting: Family Medicine

## 2023-01-09 ENCOUNTER — Other Ambulatory Visit (HOSPITAL_COMMUNITY): Payer: Self-pay

## 2023-01-09 ENCOUNTER — Encounter: Payer: Self-pay | Admitting: Hematology

## 2023-01-16 ENCOUNTER — Other Ambulatory Visit: Payer: Self-pay | Admitting: Family Medicine

## 2023-01-16 ENCOUNTER — Encounter: Payer: Self-pay | Admitting: Family Medicine

## 2023-01-16 ENCOUNTER — Encounter: Payer: Self-pay | Admitting: Hematology

## 2023-01-16 ENCOUNTER — Other Ambulatory Visit (HOSPITAL_COMMUNITY): Payer: Self-pay

## 2023-01-16 DIAGNOSIS — E559 Vitamin D deficiency, unspecified: Secondary | ICD-10-CM

## 2023-01-16 MED ORDER — VITAMIN D (ERGOCALCIFEROL) 1.25 MG (50000 UNIT) PO CAPS
50000.0000 [IU] | ORAL_CAPSULE | ORAL | 0 refills | Status: DC
  Filled 2023-01-16 – 2023-01-24 (×3): qty 5, 35d supply, fill #0

## 2023-01-17 ENCOUNTER — Encounter: Payer: Self-pay | Admitting: Hematology

## 2023-01-17 ENCOUNTER — Other Ambulatory Visit (HOSPITAL_COMMUNITY): Payer: Self-pay

## 2023-01-19 ENCOUNTER — Ambulatory Visit: Payer: Medicare Other

## 2023-01-23 ENCOUNTER — Encounter: Payer: Self-pay | Admitting: Family Medicine

## 2023-01-24 ENCOUNTER — Other Ambulatory Visit (HOSPITAL_COMMUNITY): Payer: Self-pay

## 2023-01-24 ENCOUNTER — Encounter: Payer: Self-pay | Admitting: Hematology

## 2023-01-24 ENCOUNTER — Ambulatory Visit (INDEPENDENT_AMBULATORY_CARE_PROVIDER_SITE_OTHER): Payer: Medicare Other | Admitting: Family Medicine

## 2023-01-24 VITALS — BP 123/76

## 2023-01-24 DIAGNOSIS — I10 Essential (primary) hypertension: Secondary | ICD-10-CM

## 2023-01-24 DIAGNOSIS — K117 Disturbances of salivary secretion: Secondary | ICD-10-CM | POA: Diagnosis not present

## 2023-01-24 NOTE — Progress Notes (Signed)
Pt denies CP, SOB, dizziness, or heart palpitations. taking meds as directed without problems. Denies med side effects. 5 min spent with pt. 

## 2023-01-25 LAB — SJOGREN'S SYNDROME ANTIBODS(SSA + SSB)
ENA SSA (RO) Ab: <0.2 AI (ref 0.0–0.9)
ENA SSB (LA) Ab: <0.2 AI (ref 0.0–0.9)

## 2023-01-25 LAB — ANA: Anti Nuclear Antibody (ANA): NEGATIVE

## 2023-02-10 ENCOUNTER — Other Ambulatory Visit (HOSPITAL_COMMUNITY): Payer: Self-pay

## 2023-02-14 ENCOUNTER — Other Ambulatory Visit (HOSPITAL_COMMUNITY): Payer: Self-pay

## 2023-02-15 ENCOUNTER — Other Ambulatory Visit (HOSPITAL_COMMUNITY): Payer: Self-pay

## 2023-02-17 ENCOUNTER — Other Ambulatory Visit (HOSPITAL_COMMUNITY): Payer: Self-pay

## 2023-03-08 ENCOUNTER — Other Ambulatory Visit: Payer: Self-pay

## 2023-03-08 ENCOUNTER — Other Ambulatory Visit: Payer: Self-pay | Admitting: Nurse Practitioner

## 2023-03-08 DIAGNOSIS — F4323 Adjustment disorder with mixed anxiety and depressed mood: Secondary | ICD-10-CM

## 2023-03-08 MED ORDER — FLUOXETINE HCL 40 MG PO CAPS
40.0000 mg | ORAL_CAPSULE | Freq: Every day | ORAL | 0 refills | Status: DC
Start: 2023-03-08 — End: 2023-04-09
  Filled 2023-03-08: qty 90, 90d supply, fill #0

## 2023-03-09 ENCOUNTER — Other Ambulatory Visit: Payer: Self-pay

## 2023-03-09 ENCOUNTER — Other Ambulatory Visit (HOSPITAL_COMMUNITY): Payer: Self-pay

## 2023-03-11 ENCOUNTER — Other Ambulatory Visit: Payer: Self-pay | Admitting: Nurse Practitioner

## 2023-03-11 DIAGNOSIS — E1169 Type 2 diabetes mellitus with other specified complication: Secondary | ICD-10-CM

## 2023-03-13 ENCOUNTER — Other Ambulatory Visit: Payer: Self-pay

## 2023-03-13 ENCOUNTER — Encounter: Payer: Self-pay | Admitting: Pharmacist

## 2023-03-13 MED ORDER — GLIPIZIDE 5 MG PO TABS
5.0000 mg | ORAL_TABLET | Freq: Every day | ORAL | 1 refills | Status: DC
Start: 2023-03-13 — End: 2023-04-09
  Filled 2023-03-13: qty 90, 90d supply, fill #0

## 2023-03-14 ENCOUNTER — Other Ambulatory Visit: Payer: Self-pay

## 2023-03-15 ENCOUNTER — Other Ambulatory Visit: Payer: Self-pay

## 2023-03-20 ENCOUNTER — Other Ambulatory Visit: Payer: Self-pay | Admitting: Nurse Practitioner

## 2023-03-20 ENCOUNTER — Other Ambulatory Visit (HOSPITAL_COMMUNITY): Payer: Self-pay

## 2023-03-20 MED ORDER — FREESTYLE LITE TEST VI STRP
ORAL_STRIP | 3 refills | Status: DC
  Filled 2023-03-20 – 2023-08-12 (×3): qty 100, 87d supply, fill #0

## 2023-03-21 ENCOUNTER — Other Ambulatory Visit: Payer: Self-pay

## 2023-03-23 ENCOUNTER — Other Ambulatory Visit: Payer: Self-pay

## 2023-03-24 ENCOUNTER — Other Ambulatory Visit (HOSPITAL_COMMUNITY): Payer: Self-pay

## 2023-03-26 ENCOUNTER — Other Ambulatory Visit (HOSPITAL_COMMUNITY): Payer: Self-pay

## 2023-03-27 ENCOUNTER — Other Ambulatory Visit (HOSPITAL_COMMUNITY): Payer: Self-pay

## 2023-04-05 ENCOUNTER — Other Ambulatory Visit: Payer: Self-pay

## 2023-04-05 ENCOUNTER — Other Ambulatory Visit: Payer: Self-pay | Admitting: Physician Assistant

## 2023-04-05 ENCOUNTER — Other Ambulatory Visit (HOSPITAL_COMMUNITY): Payer: Self-pay

## 2023-04-05 DIAGNOSIS — E1169 Type 2 diabetes mellitus with other specified complication: Secondary | ICD-10-CM

## 2023-04-05 MED ORDER — FREESTYLE LANCETS MISC
1 refills | Status: DC
Start: 2023-04-05 — End: 2023-11-06
  Filled 2023-04-05: qty 100, 25d supply, fill #0
  Filled 2023-08-12 – 2023-08-13 (×2): qty 100, 25d supply, fill #1

## 2023-04-06 ENCOUNTER — Encounter: Payer: Self-pay | Admitting: Family Medicine

## 2023-04-06 ENCOUNTER — Other Ambulatory Visit: Payer: Self-pay

## 2023-04-06 DIAGNOSIS — G720 Drug-induced myopathy: Secondary | ICD-10-CM | POA: Insufficient documentation

## 2023-04-09 ENCOUNTER — Ambulatory Visit (INDEPENDENT_AMBULATORY_CARE_PROVIDER_SITE_OTHER): Payer: Medicare Other | Admitting: Family Medicine

## 2023-04-09 ENCOUNTER — Encounter: Payer: Self-pay | Admitting: Family Medicine

## 2023-04-09 ENCOUNTER — Other Ambulatory Visit (HOSPITAL_COMMUNITY): Payer: Self-pay

## 2023-04-09 VITALS — BP 134/82 | HR 72 | Resp 18 | Ht 64.96 in | Wt 161.0 lb

## 2023-04-09 DIAGNOSIS — I10 Essential (primary) hypertension: Secondary | ICD-10-CM

## 2023-04-09 DIAGNOSIS — E1159 Type 2 diabetes mellitus with other circulatory complications: Secondary | ICD-10-CM | POA: Diagnosis not present

## 2023-04-09 DIAGNOSIS — E1169 Type 2 diabetes mellitus with other specified complication: Secondary | ICD-10-CM

## 2023-04-09 DIAGNOSIS — E785 Hyperlipidemia, unspecified: Secondary | ICD-10-CM | POA: Diagnosis not present

## 2023-04-09 DIAGNOSIS — I152 Hypertension secondary to endocrine disorders: Secondary | ICD-10-CM

## 2023-04-09 DIAGNOSIS — Z7984 Long term (current) use of oral hypoglycemic drugs: Secondary | ICD-10-CM

## 2023-04-09 DIAGNOSIS — E039 Hypothyroidism, unspecified: Secondary | ICD-10-CM

## 2023-04-09 DIAGNOSIS — K529 Noninfective gastroenteritis and colitis, unspecified: Secondary | ICD-10-CM | POA: Insufficient documentation

## 2023-04-09 DIAGNOSIS — F4323 Adjustment disorder with mixed anxiety and depressed mood: Secondary | ICD-10-CM | POA: Diagnosis not present

## 2023-04-09 MED ORDER — LIOTHYRONINE SODIUM 5 MCG PO TABS
ORAL_TABLET | ORAL | 2 refills | Status: DC
Start: 2023-04-09 — End: 2023-08-12
  Filled 2023-04-09 – 2023-05-17 (×4): qty 90, 30d supply, fill #0
  Filled 2023-06-27 (×2): qty 90, 30d supply, fill #1

## 2023-04-09 MED ORDER — LISINOPRIL 40 MG PO TABS
40.0000 mg | ORAL_TABLET | Freq: Every day | ORAL | 1 refills | Status: DC
Start: 2023-04-09 — End: 2023-11-22
  Filled 2023-04-09 – 2023-05-17 (×3): qty 90, 90d supply, fill #0
  Filled 2023-08-12 – 2023-08-13 (×2): qty 90, 90d supply, fill #1

## 2023-04-09 MED ORDER — SEMAGLUTIDE (2 MG/DOSE) 8 MG/3ML ~~LOC~~ SOPN
2.0000 mg | PEN_INJECTOR | SUBCUTANEOUS | 5 refills | Status: DC
Start: 2023-04-09 — End: 2023-10-02
  Filled 2023-04-09 – 2023-05-16 (×4): qty 3, 28d supply, fill #0
  Filled 2023-06-12: qty 3, 28d supply, fill #1
  Filled 2023-07-03: qty 3, 28d supply, fill #2
  Filled 2023-08-06: qty 3, 28d supply, fill #3
  Filled 2023-08-28: qty 3, 28d supply, fill #4

## 2023-04-09 MED ORDER — GLIPIZIDE 5 MG PO TABS
5.0000 mg | ORAL_TABLET | Freq: Every day | ORAL | 1 refills | Status: DC
Start: 2023-04-09 — End: 2023-12-18
  Filled 2023-04-09 – 2023-06-12 (×3): qty 90, 90d supply, fill #0
  Filled 2023-09-07: qty 90, 90d supply, fill #1

## 2023-04-09 MED ORDER — METFORMIN HCL 1000 MG PO TABS
500.0000 mg | ORAL_TABLET | Freq: Two times a day (BID) | ORAL | 1 refills | Status: DC
Start: 2023-04-09 — End: 2023-05-17
  Filled 2023-04-09: qty 180, 180d supply, fill #0

## 2023-04-09 MED ORDER — FLUOXETINE HCL 40 MG PO CAPS
40.0000 mg | ORAL_CAPSULE | Freq: Every day | ORAL | 0 refills | Status: DC
Start: 2023-04-09 — End: 2023-09-07
  Filled 2023-04-09 – 2023-06-12 (×3): qty 90, 90d supply, fill #0

## 2023-04-09 MED ORDER — SYNTHROID 100 MCG PO TABS
100.0000 ug | ORAL_TABLET | Freq: Every day | ORAL | 1 refills | Status: DC
Start: 2023-04-09 — End: 2023-04-20
  Filled 2023-04-09: qty 90, 90d supply, fill #0

## 2023-04-09 MED ORDER — EZETIMIBE 10 MG PO TABS
10.0000 mg | ORAL_TABLET | Freq: Every day | ORAL | 1 refills | Status: DC
Start: 2023-04-09 — End: 2023-11-22
  Filled 2023-04-09 – 2023-07-24 (×3): qty 90, 90d supply, fill #0

## 2023-04-09 NOTE — Progress Notes (Signed)
Established Patient Office Visit  Subjective   Patient ID: Robin Greene, female    DOB: [birth year]  Age: 67 y.o. MRN: [id number]  Chief Complaint  Patient presents with   Hypertension    HPI Robin Greene is a 67 y.o. female presenting today for follow up of hypertension, hyperlipidemia, diabetes, hypothyroidism.  She also has concerns of several symptoms that have started ever since her surgery in November 2022.  She has noticed that she frequently experiences abdominal bloating, gas, and has a lower appetite than usual.  She typically has breakfast and lunch but is not hungry at dinnertime and skips dinner.  When she does eat dinner, she will often get diarrhea that is accompanied by hot flashes and tachycardia.  These episodes typically occur 1-2 times a week.  She has not noticed that they are associated with any particular food. Hypertension: Patient here for follow-up of elevated blood pressure.  Pt denies chest pain, SOB, dizziness, edema, syncope, fatigue or heart palpitations. Taking hydrochlorothiazide and lisinopril, reports excellent compliance with treatment. Denies side effects. Hyperlipidemia: tolerating ezetimibe well with no myalgias or significant side effects.  The 10-year ASCVD risk score (Arnett DK, et al., 2019) is: 21.8% Diabetes: denies hypoglycemic events, wounds or sores that are not healing well, increased thirst or urination. Denies vision problems, eye exam scheduled for next month. Checking glucose at home, fasting ranges have been 180-220. Taking Ozempic, metformin, glipizide as prescribed without any side effects.  Hypothyroidism: Taking Synthroid 100 mcg and liothyronine 10 mcg regularly in the AM away from food and vitamins. Denies fatigue, weight changes, heat/cold intolerance, skin/hair changes, CVS symptoms.  ROS Negative unless otherwise noted in HPI   Objective:     BP 134/82   Pulse 72   Resp 18   Ht 5' 4.96" (1.65 m)   Wt 161 lb (73 kg)   LMP  (LMP  Unknown)   SpO2 100%   BMI 26.82 kg/m   Physical Exam Constitutional:      General: She is not in acute distress.    Appearance: Normal appearance.  HENT:     Head: Normocephalic and atraumatic.  Cardiovascular:     Rate and Rhythm: Normal rate and regular rhythm.     Heart sounds: No murmur heard.    No friction rub. No gallop.  Pulmonary:     Effort: Pulmonary effort is normal. No respiratory distress.     Breath sounds: No wheezing, rhonchi or rales.  Skin:    General: Skin is warm and dry.  Neurological:     Mental Status: She is alert and oriented to person, place, and time.      Assessment & Plan:  Hypertension associated with diabetes (HCC)-  with superimposed white coat HTN Assessment & Plan: BP goal <130/80.  Patient does have whitecoat hypertension, slightly above goal 134/82 in the office today but was at goal at blood pressure check/at home blood pressure log.  Continue hydrochlorothiazide 50 mg daily, lisinopril 40 mg daily.  Checking CMP for medication monitoring today.  Will continue to monitor.  Orders: -     CBC with Differential/Platelet; Future -     Comprehensive metabolic panel; Future -     Lisinopril; Take 1 tablet (40 mg total) by mouth daily.  Dispense: 90 tablet; Refill: 1  White coat syndrome with diagnosis of hypertension  Hyperlipidemia associated with type 2 diabetes mellitus (HCC) Assessment & Plan: Last lipid panel: LDL 133, HDL 50, triglycerides 223.  Repeating lipid panel today.  Continue Zetia 10 mg daily and low-fat/low-carb diet.  Will adjust medication regimen as indicated by labs.  Orders: -     CBC with Differential/Platelet; Future -     Comprehensive metabolic panel; Future -     Lipid panel; Future -     Ezetimibe; Take 1 tablet (10 mg total) by mouth daily.  Dispense: 90 tablet; Refill: 1  Type 2 diabetes mellitus with other specified complication, without long-term current use of insulin (HCC) Assessment & Plan: Last A1c  8.7, well above goal.  Rechecking A1c today.  Continue Ozempic 2 mg weekly, metformin 1000 mg daily with breakfast and 500 mg daily with dinner, glipizide 5 mg daily.  Will continue to monitor.  If still above goal, recommend referral to endocrinology.  Orders: -     CBC with Differential/Platelet; Future -     Comprehensive metabolic panel; Future -     Hemoglobin A1c; Future -     glipiZIDE; Take 1 tablet (5 mg total) by mouth daily before breakfast.  Dispense: 90 tablet; Refill: 1 -     metFORMIN HCl; Take 0.5 tablets (500 mg total) by mouth 2 (two) times daily with a meal.  Dispense: 180 tablet; Refill: 1 -     Semaglutide (2 MG/DOSE); Inject 2 mg as directed once a week.  Dispense: 3 mL; Refill: 5  Hypothyroidism, unspecified type Assessment & Plan: Repeating TSH, T4, and T3 today.  Patient endorses recent postprandial diarrhea, hot flash, tachycardia in addition to general bloating, gas, decrease in appetite.  Starting with lab workup.  Orders: -     TSH; Future -     T4, free; Future -     T3, free; Future -     Liothyronine Sodium; Take 2 tablets by mouth each morning and 1 tablet daily in the afternoon.  Dispense: 90 tablet; Refill: 2 -     Synthroid; Take 1 tablet (100 mcg total) by mouth daily before breakfast.  Dispense: 90 tablet; Refill: 1  Adjustment disorder with mixed anxiety and depressed mood -     FLUoxetine HCl; Take 1 capsule (40 mg total) by mouth daily. Need follow up appt for refills  Dispense: 90 capsule; Refill: 0  Postprandial diarrhea Assessment & Plan: Encourage patient to monitor symptoms and any associated foods or triggers.  Starting workup with general lab work, especially thyroid labs.  Given additional symptoms of bloating, decreased appetite/possible early satiety considered checking Ca 125, but patient is s/p bilateral oophorectomy.  If other lab work within normal limits, may consider 24-hour 5-HIAA and/or abdominal imaging.     Return in about 3  months (around 07/10/2023) for follow-up for HTN, HLD, DM, hypothyroidism, fasting blood work 1 week before.    Melida Quitter, PA

## 2023-04-09 NOTE — Assessment & Plan Note (Signed)
Encourage patient to monitor symptoms and any associated foods or triggers.  Starting workup with general lab work, especially thyroid labs.  Given additional symptoms of bloating, decreased appetite/possible early satiety considered checking Ca 125, but patient is s/p bilateral oophorectomy.  If other lab work within normal limits, may consider 24-hour 5-HIAA and/or abdominal imaging.

## 2023-04-09 NOTE — Assessment & Plan Note (Addendum)
BP goal <130/80.  Patient does have whitecoat hypertension, slightly above goal 134/82 in the office today but was at goal at blood pressure check/at home blood pressure log.  Continue hydrochlorothiazide 50 mg daily, lisinopril 40 mg daily.  Checking CMP for medication monitoring today.  Will continue to monitor.

## 2023-04-09 NOTE — Assessment & Plan Note (Signed)
Last A1c 8.7, well above goal.  Rechecking A1c today.  Continue Ozempic 2 mg weekly, metformin 1000 mg daily with breakfast and 500 mg daily with dinner, glipizide 5 mg daily.  Will continue to monitor.  If still above goal, recommend referral to endocrinology.

## 2023-04-09 NOTE — Assessment & Plan Note (Signed)
Last lipid panel: LDL 133, HDL 50, triglycerides 223.  Repeating lipid panel today.  Continue Zetia 10 mg daily and low-fat/low-carb diet.  Will adjust medication regimen as indicated by labs.

## 2023-04-09 NOTE — Assessment & Plan Note (Signed)
Repeating TSH, T4, and T3 today.  Patient endorses recent postprandial diarrhea, hot flash, tachycardia in addition to general bloating, gas, decrease in appetite.  Starting with lab workup.

## 2023-04-10 ENCOUNTER — Other Ambulatory Visit: Payer: Self-pay | Admitting: Family Medicine

## 2023-04-10 ENCOUNTER — Encounter: Payer: Self-pay | Admitting: Family Medicine

## 2023-04-10 DIAGNOSIS — K529 Noninfective gastroenteritis and colitis, unspecified: Secondary | ICD-10-CM

## 2023-04-10 DIAGNOSIS — R7401 Elevation of levels of liver transaminase levels: Secondary | ICD-10-CM

## 2023-04-10 LAB — COMPREHENSIVE METABOLIC PANEL
ALT: 33 IU/L — ABNORMAL HIGH (ref 0–32)
AST: 36 IU/L (ref 0–40)
Albumin: 4.5 g/dL (ref 3.9–4.9)
Alkaline Phosphatase: 86 IU/L (ref 44–121)
BUN/Creatinine Ratio: 18 (ref 12–28)
BUN: 13 mg/dL (ref 8–27)
Bilirubin Total: 0.7 mg/dL (ref 0.0–1.2)
CO2: 24 mmol/L (ref 20–29)
Calcium: 10.1 mg/dL (ref 8.7–10.3)
Chloride: 94 mmol/L — ABNORMAL LOW (ref 96–106)
Creatinine, Ser: 0.72 mg/dL (ref 0.57–1.00)
Globulin, Total: 2.6 g/dL (ref 1.5–4.5)
Glucose: 181 mg/dL — ABNORMAL HIGH (ref 70–99)
Potassium: 3.9 mmol/L (ref 3.5–5.2)
Sodium: 136 mmol/L (ref 134–144)
Total Protein: 7.1 g/dL (ref 6.0–8.5)
eGFR: 91 mL/min/{1.73_m2} (ref >59)

## 2023-04-10 LAB — CBC WITH DIFFERENTIAL/PLATELET
Basophils Absolute: 0.1 10*3/uL (ref 0.0–0.2)
Basos: 1 %
EOS (ABSOLUTE): 0.5 10*3/uL — ABNORMAL HIGH (ref 0.0–0.4)
Eos: 6 %
Hematocrit: 41.9 % (ref 34.0–46.6)
Hemoglobin: 14.1 g/dL (ref 11.1–15.9)
Immature Grans (Abs): 0 10*3/uL (ref 0.0–0.1)
Immature Granulocytes: 0 %
Lymphocytes Absolute: 1.4 10*3/uL (ref 0.7–3.1)
Lymphs: 17 %
MCH: 29.6 pg (ref 26.6–33.0)
MCHC: 33.7 g/dL (ref 31.5–35.7)
MCV: 88 fL (ref 79–97)
Monocytes Absolute: 0.5 10*3/uL (ref 0.1–0.9)
Monocytes: 6 %
Neutrophils Absolute: 5.9 10*3/uL (ref 1.4–7.0)
Neutrophils: 70 %
Platelets: 353 10*3/uL (ref 150–450)
RBC: 4.77 x10E6/uL (ref 3.77–5.28)
RDW: 13.1 % (ref 11.7–15.4)
WBC: 8.5 10*3/uL (ref 3.4–10.8)

## 2023-04-10 LAB — LIPID PANEL
Chol/HDL Ratio: 3.3 ratio (ref 0.0–4.4)
Cholesterol, Total: 182 mg/dL (ref 100–199)
HDL: 55 mg/dL (ref >39)
LDL Chol Calc (NIH): 98 mg/dL (ref 0–99)
Triglycerides: 171 mg/dL — ABNORMAL HIGH (ref 0–149)
VLDL Cholesterol Cal: 29 mg/dL (ref 5–40)

## 2023-04-10 LAB — HEMOGLOBIN A1C
Est. average glucose Bld gHb Est-mCnc: 206 mg/dL
Hgb A1c MFr Bld: 8.8 % — ABNORMAL HIGH (ref 4.8–5.6)

## 2023-04-10 LAB — T4, FREE: Free T4: 1.3 ng/dL (ref 0.82–1.77)

## 2023-04-10 LAB — T3, FREE: T3, Free: 4.1 pg/mL (ref 2.0–4.4)

## 2023-04-10 LAB — TSH: TSH: 0.23 u[IU]/mL — ABNORMAL LOW (ref 0.450–4.500)

## 2023-04-11 ENCOUNTER — Encounter: Payer: Self-pay | Admitting: Family Medicine

## 2023-04-12 ENCOUNTER — Other Ambulatory Visit (HOSPITAL_COMMUNITY): Payer: Self-pay

## 2023-04-12 ENCOUNTER — Encounter: Payer: Self-pay | Admitting: Hematology

## 2023-04-13 ENCOUNTER — Other Ambulatory Visit: Payer: Self-pay

## 2023-04-17 ENCOUNTER — Ambulatory Visit
Admission: RE | Admit: 2023-04-17 | Discharge: 2023-04-17 | Disposition: A | Discharge status: Home / Self Care | Payer: Medicare Other | Source: Ambulatory Visit | Attending: Family Medicine | Admitting: Family Medicine

## 2023-04-17 DIAGNOSIS — R945 Abnormal results of liver function studies: Secondary | ICD-10-CM | POA: Diagnosis not present

## 2023-04-17 DIAGNOSIS — R7401 Elevation of levels of liver transaminase levels: Secondary | ICD-10-CM

## 2023-04-17 DIAGNOSIS — R932 Abnormal findings on diagnostic imaging of liver and biliary tract: Secondary | ICD-10-CM | POA: Diagnosis not present

## 2023-04-17 DIAGNOSIS — K529 Noninfective gastroenteritis and colitis, unspecified: Secondary | ICD-10-CM

## 2023-04-18 ENCOUNTER — Encounter: Payer: Self-pay | Admitting: Family Medicine

## 2023-04-18 DIAGNOSIS — E039 Hypothyroidism, unspecified: Secondary | ICD-10-CM

## 2023-04-20 ENCOUNTER — Other Ambulatory Visit (HOSPITAL_COMMUNITY): Payer: Self-pay

## 2023-04-20 MED ORDER — LEVOTHYROXINE SODIUM 88 MCG PO TABS
88.0000 ug | ORAL_TABLET | Freq: Every day | ORAL | 2 refills | Status: DC
Start: 2023-04-20 — End: 2023-05-11
  Filled 2023-04-20 – 2023-05-11 (×2): qty 30, 30d supply, fill #0

## 2023-04-20 NOTE — Addendum Note (Signed)
Addended by: Saralyn Pilar on: 04/20/2023 10:48 AM   Modules accepted: Orders

## 2023-04-24 ENCOUNTER — Other Ambulatory Visit (HOSPITAL_COMMUNITY): Payer: Self-pay

## 2023-04-25 ENCOUNTER — Other Ambulatory Visit (HOSPITAL_COMMUNITY): Payer: Self-pay

## 2023-04-30 ENCOUNTER — Other Ambulatory Visit (HOSPITAL_COMMUNITY): Payer: Self-pay

## 2023-05-01 ENCOUNTER — Other Ambulatory Visit (HOSPITAL_COMMUNITY): Payer: Self-pay

## 2023-05-02 ENCOUNTER — Other Ambulatory Visit (HOSPITAL_COMMUNITY): Payer: Self-pay

## 2023-05-02 DIAGNOSIS — D2271 Melanocytic nevi of right lower limb, including hip: Secondary | ICD-10-CM | POA: Diagnosis not present

## 2023-05-02 DIAGNOSIS — L448 Other specified papulosquamous disorders: Secondary | ICD-10-CM | POA: Diagnosis not present

## 2023-05-02 DIAGNOSIS — D485 Neoplasm of uncertain behavior of skin: Secondary | ICD-10-CM | POA: Diagnosis not present

## 2023-05-02 DIAGNOSIS — L821 Other seborrheic keratosis: Secondary | ICD-10-CM | POA: Diagnosis not present

## 2023-05-02 DIAGNOSIS — L570 Actinic keratosis: Secondary | ICD-10-CM | POA: Diagnosis not present

## 2023-05-11 ENCOUNTER — Other Ambulatory Visit: Payer: Self-pay

## 2023-05-11 ENCOUNTER — Other Ambulatory Visit (HOSPITAL_COMMUNITY): Payer: Self-pay

## 2023-05-11 MED ORDER — LEVOTHYROXINE SODIUM 88 MCG PO TABS
88.0000 ug | ORAL_TABLET | Freq: Every day | ORAL | 2 refills | Status: DC
Start: 2023-05-11 — End: 2023-08-17
  Filled 2023-05-11 – 2023-05-17 (×2): qty 30, 30d supply, fill #0
  Filled 2023-06-12: qty 30, 30d supply, fill #1
  Filled 2023-07-24: qty 30, 30d supply, fill #2

## 2023-05-11 NOTE — Addendum Note (Signed)
Addended by: Saralyn Pilar on: 05/11/2023 11:50 AM   Modules accepted: Orders

## 2023-05-14 ENCOUNTER — Encounter: Payer: Self-pay | Admitting: Pharmacist

## 2023-05-14 ENCOUNTER — Other Ambulatory Visit: Payer: Self-pay

## 2023-05-15 ENCOUNTER — Other Ambulatory Visit (HOSPITAL_COMMUNITY): Payer: Self-pay

## 2023-05-16 ENCOUNTER — Other Ambulatory Visit (HOSPITAL_COMMUNITY): Payer: Self-pay

## 2023-05-17 ENCOUNTER — Other Ambulatory Visit (HOSPITAL_COMMUNITY): Payer: Self-pay

## 2023-05-17 ENCOUNTER — Other Ambulatory Visit: Payer: Self-pay | Admitting: Family Medicine

## 2023-05-17 ENCOUNTER — Other Ambulatory Visit: Payer: Self-pay

## 2023-05-17 DIAGNOSIS — E1169 Type 2 diabetes mellitus with other specified complication: Secondary | ICD-10-CM

## 2023-05-18 ENCOUNTER — Other Ambulatory Visit (HOSPITAL_COMMUNITY): Payer: Self-pay

## 2023-05-18 ENCOUNTER — Other Ambulatory Visit: Payer: Self-pay

## 2023-05-18 MED ORDER — METFORMIN HCL 1000 MG PO TABS
1000.0000 mg | ORAL_TABLET | Freq: Two times a day (BID) | ORAL | 1 refills | Status: DC
Start: 2023-05-18 — End: 2023-12-18
  Filled 2023-05-18 – 2023-05-25 (×2): qty 180, 90d supply, fill #0
  Filled 2023-09-07: qty 180, 90d supply, fill #1

## 2023-05-25 ENCOUNTER — Other Ambulatory Visit (HOSPITAL_COMMUNITY): Payer: Self-pay

## 2023-06-07 ENCOUNTER — Other Ambulatory Visit: Payer: Self-pay

## 2023-06-07 ENCOUNTER — Other Ambulatory Visit (HOSPITAL_COMMUNITY): Payer: Self-pay

## 2023-06-08 ENCOUNTER — Encounter: Payer: Self-pay | Admitting: Pharmacist

## 2023-06-08 ENCOUNTER — Other Ambulatory Visit: Payer: Self-pay

## 2023-06-12 ENCOUNTER — Other Ambulatory Visit: Payer: Self-pay

## 2023-06-12 ENCOUNTER — Other Ambulatory Visit (HOSPITAL_COMMUNITY): Payer: Self-pay

## 2023-06-14 DIAGNOSIS — M25462 Effusion, left knee: Secondary | ICD-10-CM | POA: Diagnosis not present

## 2023-06-27 ENCOUNTER — Other Ambulatory Visit (HOSPITAL_COMMUNITY): Payer: Self-pay

## 2023-06-27 DIAGNOSIS — E119 Type 2 diabetes mellitus without complications: Secondary | ICD-10-CM | POA: Diagnosis not present

## 2023-06-27 LAB — HM DIABETES EYE EXAM

## 2023-07-03 ENCOUNTER — Other Ambulatory Visit (HOSPITAL_COMMUNITY): Payer: Self-pay

## 2023-07-04 ENCOUNTER — Other Ambulatory Visit (HOSPITAL_COMMUNITY): Payer: Self-pay

## 2023-07-05 ENCOUNTER — Other Ambulatory Visit (HOSPITAL_COMMUNITY): Payer: Self-pay

## 2023-07-20 ENCOUNTER — Other Ambulatory Visit (HOSPITAL_COMMUNITY): Payer: Self-pay

## 2023-07-24 ENCOUNTER — Other Ambulatory Visit (HOSPITAL_COMMUNITY): Payer: Self-pay

## 2023-08-06 ENCOUNTER — Other Ambulatory Visit (HOSPITAL_COMMUNITY): Payer: Self-pay

## 2023-08-07 ENCOUNTER — Other Ambulatory Visit (HOSPITAL_COMMUNITY): Payer: Self-pay

## 2023-08-12 ENCOUNTER — Other Ambulatory Visit: Payer: Self-pay | Admitting: Family Medicine

## 2023-08-12 DIAGNOSIS — E039 Hypothyroidism, unspecified: Secondary | ICD-10-CM

## 2023-08-12 DIAGNOSIS — E1159 Type 2 diabetes mellitus with other circulatory complications: Secondary | ICD-10-CM

## 2023-08-13 ENCOUNTER — Encounter: Payer: Self-pay | Admitting: Family Medicine

## 2023-08-13 ENCOUNTER — Ambulatory Visit (INDEPENDENT_AMBULATORY_CARE_PROVIDER_SITE_OTHER): Payer: Medicare Other | Admitting: Family Medicine

## 2023-08-13 ENCOUNTER — Other Ambulatory Visit: Payer: Self-pay

## 2023-08-13 ENCOUNTER — Other Ambulatory Visit (HOSPITAL_COMMUNITY): Payer: Self-pay

## 2023-08-13 ENCOUNTER — Encounter: Payer: Self-pay | Admitting: Hematology

## 2023-08-13 VITALS — BP 149/84 | HR 72 | Temp 98.3°F | Ht 64.96 in | Wt 165.4 lb

## 2023-08-13 DIAGNOSIS — J22 Unspecified acute lower respiratory infection: Secondary | ICD-10-CM | POA: Insufficient documentation

## 2023-08-13 MED ORDER — AMOXICILLIN-POT CLAVULANATE 875-125 MG PO TABS
1.0000 | ORAL_TABLET | Freq: Two times a day (BID) | ORAL | 0 refills | Status: AC
  Filled 2023-08-13: qty 10, 5d supply, fill #0

## 2023-08-13 MED ORDER — HYDROCHLOROTHIAZIDE 50 MG PO TABS
50.0000 mg | ORAL_TABLET | Freq: Every day | ORAL | 1 refills | Status: DC
Start: 2023-08-13 — End: 2024-02-21
  Filled 2023-08-13: qty 90, 90d supply, fill #0
  Filled 2023-11-22: qty 90, 90d supply, fill #1

## 2023-08-13 MED ORDER — LIOTHYRONINE SODIUM 5 MCG PO TABS
ORAL_TABLET | ORAL | 2 refills | Status: DC
  Filled 2023-08-13: qty 90, 30d supply, fill #0
  Filled 2023-09-07: qty 90, 30d supply, fill #1
  Filled 2023-10-26 – 2023-10-29 (×4): qty 90, 30d supply, fill #2

## 2023-08-13 MED ORDER — BENZONATATE 100 MG PO CAPS
100.0000 mg | ORAL_CAPSULE | Freq: Two times a day (BID) | ORAL | 0 refills | Status: DC | PRN
  Filled 2023-08-13: qty 20, 10d supply, fill #0

## 2023-08-13 MED ORDER — AZITHROMYCIN 250 MG PO TABS
ORAL_TABLET | ORAL | 0 refills | Status: AC
  Filled 2023-08-13: qty 6, 5d supply, fill #0

## 2023-08-13 NOTE — Patient Instructions (Signed)
It was nice to see you today,  We addressed the following topics today: -I have sent in a prescription for 5 days of Augmentin and azithromycin.  Take the Augmentin twice a day and the azithromycin once daily - I have also sent in prescription for Tessalon Perles.  Use these as needed - He can also use nasal saline spray for nasal congestion and Tylenol 1000 mg every 8 hours as needed.  Have a great day,  Frederic Jericho, MD

## 2023-08-13 NOTE — Assessment & Plan Note (Signed)
Uncertain if her initial symptoms were allergy related and then she developed an infection or if her initial symptoms were related to an infection.  Potentially with a respiratory infection for the past week.  Did not test for flu as she is out of the window for treatment.  Favoring viral with her nasal congestion and rhinorrhea, itchy watery eyes.  Concern for potential progression of that to a bacterial lower respiratory infection, therefore prescribed 5 days of antibiotics due to her age and history of asthma.  Also prescribed Tessalon Perles and recommended nasal saline spray over-the-counter for nasal congestion.

## 2023-08-13 NOTE — Progress Notes (Signed)
Acute Office Visit  Subjective:     Patient ID: ANILU COUNCILMAN, female    DOB: [birth year], 67 y.o.   MRN: [id number]  Chief Complaint  Patient presents with   Cough    HPI Patient is in today for URI symptoms with cough.  Patient states her symptoms started last Monday with what she assumed were allergy symptoms including itchy eyes and ears, stuffy nose.  These symptoms did not resolve like they typically do with her allergy treatment.  On Friday she felt like it had "gone into her chest" and she was having cough productive of white sputum.  Some wheezing.  No fever or chills.  No nausea vomiting diarrhea.  Patient has asthma for which she uses albuterol sparingly.  Only uses it a few times a year.  She has taken Coricidin HBP and her allergy medicine.  ROS      Objective:    BP (!) 149/84   Pulse 72   Temp 98.3 F (36.8 C)   Ht 5' 4.96" (1.65 m)   Wt 165 lb 6.4 oz (75 kg)   LMP  (LMP Unknown)   SpO2 98%   BMI 27.56 kg/m    Physical Exam General: Alert, oriented HEENT: Moist oral mucosa, no oropharyngeal erythema. CV: Regular rate and rhythm Pulmonary: No wheezing or rhonchi.  Coughing.  No respiratory stress or tachypnea  No results found for any visits on 08/13/23.      Assessment & Plan:   Acute respiratory infection Assessment & Plan: Uncertain if her initial symptoms were allergy related and then she developed an infection or if her initial symptoms were related to an infection.  Potentially with a respiratory infection for the past week.  Did not test for flu as she is out of the window for treatment.  Favoring viral with her nasal congestion and rhinorrhea, itchy watery eyes.  Concern for potential progression of that to a bacterial lower respiratory infection, therefore prescribed 5 days of antibiotics due to her age and history of asthma.  Also prescribed Tessalon Perles and recommended nasal saline spray over-the-counter for nasal congestion.   Other  orders -     Benzonatate; Take 1 capsule (100 mg total) by mouth 2 (two) times daily as needed for cough.  Dispense: 20 capsule; Refill: 0 -     Amoxicillin-Pot Clavulanate; Take 1 tablet by mouth 2 (two) times daily for 5 days.  Dispense: 10 tablet; Refill: 0 -     Azithromycin; Take 2 tablets on day 1, then 1 tablet daily on days 2 through 5  Dispense: 6 tablet; Refill: 0     No follow-ups on file.  Sandre Kitty, MD

## 2023-08-15 ENCOUNTER — Other Ambulatory Visit: Payer: Self-pay | Admitting: Family Medicine

## 2023-08-15 ENCOUNTER — Encounter: Payer: Self-pay | Admitting: Family Medicine

## 2023-08-15 MED ORDER — FLUCONAZOLE 150 MG PO TABS
150.0000 mg | ORAL_TABLET | Freq: Once | ORAL | 1 refills | Status: AC
  Filled 2023-08-15 – 2023-08-16 (×2): qty 1, 1d supply, fill #0
  Filled 2023-08-17 – 2023-09-28 (×3): qty 1, 1d supply, fill #1

## 2023-08-16 ENCOUNTER — Other Ambulatory Visit: Payer: Self-pay

## 2023-08-16 ENCOUNTER — Other Ambulatory Visit (HOSPITAL_COMMUNITY): Payer: Self-pay

## 2023-08-17 ENCOUNTER — Other Ambulatory Visit (HOSPITAL_COMMUNITY): Payer: Self-pay

## 2023-08-17 ENCOUNTER — Other Ambulatory Visit: Payer: Self-pay | Admitting: Family Medicine

## 2023-08-17 DIAGNOSIS — E039 Hypothyroidism, unspecified: Secondary | ICD-10-CM

## 2023-08-17 NOTE — Progress Notes (Signed)
Pharmacy Quality Measure Review  This patient is appearing on report for being at risk of failing the measure for Statin Use in Persons with Diabetes (SUPD) medications this calendar year. Documented statin intolerance (myopathy and abnormal LFTs).   Prior trials of:  -Rosuvastatin -Atorvastatin -Simvastatin   Currently on:  -Ezetimibe 10 mg daily   LDL decreased but still  > 70 despite Zetia therapy.   The 10-year ASCVD risk score (Arnett DK, et al., 2019) is: 26.4%   Values used to calculate the score:     Age: 67 years     Sex: Female     Is Non-Hispanic African American: No     Diabetic: Yes     Tobacco smoker: No     Systolic Blood Pressure: 149 mmHg     Is BP treated: Yes     HDL Cholesterol: 55 mg/dL     Total Cholesterol: 182 mg/dL   Consider adding PCSK-9 inhibitor for additional lipid lowering.   Sofie Rower, PharmD [facility] PGY-1

## 2023-08-20 ENCOUNTER — Other Ambulatory Visit (HOSPITAL_COMMUNITY): Payer: Self-pay

## 2023-08-20 MED ORDER — LEVOTHYROXINE SODIUM 88 MCG PO TABS
88.0000 ug | ORAL_TABLET | Freq: Every day | ORAL | 0 refills | Status: DC
  Filled 2023-08-20: qty 30, 30d supply, fill #0

## 2023-08-22 ENCOUNTER — Other Ambulatory Visit: Payer: Self-pay

## 2023-08-23 ENCOUNTER — Ambulatory Visit (INDEPENDENT_AMBULATORY_CARE_PROVIDER_SITE_OTHER): Payer: Medicare Other

## 2023-08-23 DIAGNOSIS — Z23 Encounter for immunization: Secondary | ICD-10-CM

## 2023-08-23 NOTE — Progress Notes (Signed)
Patient is her for Flu vaccination

## 2023-08-28 ENCOUNTER — Other Ambulatory Visit (HOSPITAL_COMMUNITY): Payer: Self-pay

## 2023-08-29 ENCOUNTER — Other Ambulatory Visit: Payer: Self-pay

## 2023-09-05 DIAGNOSIS — F324 Major depressive disorder, single episode, in partial remission: Secondary | ICD-10-CM | POA: Diagnosis not present

## 2023-09-07 ENCOUNTER — Other Ambulatory Visit: Payer: Self-pay | Admitting: Family Medicine

## 2023-09-07 ENCOUNTER — Other Ambulatory Visit (HOSPITAL_COMMUNITY): Payer: Self-pay

## 2023-09-07 ENCOUNTER — Other Ambulatory Visit: Payer: Self-pay

## 2023-09-07 DIAGNOSIS — F4323 Adjustment disorder with mixed anxiety and depressed mood: Secondary | ICD-10-CM

## 2023-09-10 ENCOUNTER — Other Ambulatory Visit: Payer: Self-pay

## 2023-09-10 MED ORDER — FLUOXETINE HCL 40 MG PO CAPS
40.0000 mg | ORAL_CAPSULE | Freq: Every day | ORAL | 0 refills | Status: DC
  Filled 2023-09-10: qty 90, 90d supply, fill #0

## 2023-09-18 ENCOUNTER — Other Ambulatory Visit: Payer: Self-pay | Admitting: Family Medicine

## 2023-09-18 ENCOUNTER — Other Ambulatory Visit: Payer: Self-pay

## 2023-09-18 ENCOUNTER — Other Ambulatory Visit (HOSPITAL_COMMUNITY): Payer: Self-pay

## 2023-09-18 DIAGNOSIS — E039 Hypothyroidism, unspecified: Secondary | ICD-10-CM

## 2023-09-18 DIAGNOSIS — B3731 Acute candidiasis of vulva and vagina: Secondary | ICD-10-CM

## 2023-09-18 MED ORDER — LEVOTHYROXINE SODIUM 88 MCG PO TABS
88.0000 ug | ORAL_TABLET | Freq: Every day | ORAL | 0 refills | Status: DC
  Filled 2023-09-18: qty 30, 30d supply, fill #0

## 2023-09-23 ENCOUNTER — Telehealth: Payer: Medicare Other | Admitting: Family Medicine

## 2023-09-23 DIAGNOSIS — J40 Bronchitis, not specified as acute or chronic: Secondary | ICD-10-CM | POA: Diagnosis not present

## 2023-09-23 MED ORDER — AZITHROMYCIN 250 MG PO TABS: ORAL_TABLET | ORAL | 0 refills | Status: DC

## 2023-09-23 MED ORDER — BENZONATATE 100 MG PO CAPS: 100.0000 mg | ORAL_CAPSULE | Freq: Two times a day (BID) | ORAL | 0 refills | Status: DC | PRN

## 2023-09-23 MED ORDER — BENZONATATE 200 MG PO CAPS
200.0000 mg | ORAL_CAPSULE | Freq: Two times a day (BID) | ORAL | 0 refills | Status: DC | PRN
Start: 2023-09-23 — End: 2023-09-27

## 2023-09-23 NOTE — Progress Notes (Signed)

## 2023-09-27 ENCOUNTER — Encounter: Payer: Self-pay | Admitting: Family Medicine

## 2023-09-27 ENCOUNTER — Ambulatory Visit (INDEPENDENT_AMBULATORY_CARE_PROVIDER_SITE_OTHER): Payer: Medicare Other | Admitting: Family Medicine

## 2023-09-27 VITALS — BP 134/80 | HR 67 | Temp 97.4°F | Resp 18 | Ht 64.96 in | Wt 159.0 lb

## 2023-09-27 DIAGNOSIS — R058 Other specified cough: Secondary | ICD-10-CM | POA: Diagnosis not present

## 2023-09-27 DIAGNOSIS — J22 Unspecified acute lower respiratory infection: Secondary | ICD-10-CM

## 2023-09-27 MED ORDER — ALBUTEROL SULFATE HFA 108 (90 BASE) MCG/ACT IN AERS: 2.0000 | INHALATION_SPRAY | Freq: Four times a day (QID) | RESPIRATORY_TRACT | 0 refills | Status: DC | PRN

## 2023-09-27 NOTE — Progress Notes (Signed)
Acute Office Visit  Subjective:     Patient ID: Robin Greene, female    DOB: 1955-11-23, 67 y.o.   MRN: [id number]  Chief Complaint  Patient presents with   Cough    3 weeks. Yellowish - white mucus    HPI Patient is in today for ongoing cough and congestion.  Initially presented on 08/13/2023 and saw Dr. Constance Goltz complaining of URI symptoms with cough.  At that time, prescribed 5 days of Augmentin and azithromycin, Tessalon Perles.  She had an e-visit on 09/23/2023 and was prescribed azithromycin and Tessalon Perles again for 2 weeks of productive cough with green mucus.  She has a headache if she does not take Mucinex and continues to experience productive cough even after the course of antibiotics.  She has also tried Flonase, Sudafed, and Tylenol with minimal improvement.  She states that the antibiotics prescribed by Dr. Constance Goltz did result in significant improvement of symptoms but she is not sure if it fully resolved.  Tessalon Perles decrease coughing at night which allows her to sleep, and completing second course of antibiotics prescribed during virtual visit change sputum color from green to yellow.  She endorses continued clear rhinorrhea, productive cough, and occasional chest tightness.  She has also had a couple of episodes of waking up sweating at night but no measurable fever.  ROS See HPI     Objective:    BP 134/80 (BP Location: Left Arm, Patient Position: Sitting, Cuff Size: Normal)   Pulse 67   Temp (!) 97.4 F (36.3 C) (Oral)   Resp 18   Ht 5' 4.96" (1.65 m)   Wt 159 lb (72.1 kg)   LMP  (LMP Unknown)   SpO2 97%   BMI 26.49 kg/m   Physical Exam Constitutional:      General: She is not in acute distress.    Appearance: Normal appearance. She is not ill-appearing.  HENT:     Head: Normocephalic and atraumatic.     Right Ear: Tympanic membrane, ear canal and external ear normal.     Left Ear: Tympanic membrane, ear canal and external ear normal.     Nose: Nose  normal. No congestion or rhinorrhea.     Mouth/Throat:     Mouth: Mucous membranes are moist.     Pharynx: Oropharynx is clear. No oropharyngeal exudate or posterior oropharyngeal erythema.  Eyes:     General:        Right eye: No discharge.        Left eye: No discharge.     Conjunctiva/sclera: Conjunctivae normal.     Pupils: Pupils are equal, round, and reactive to light.  Cardiovascular:     Rate and Rhythm: Normal rate and regular rhythm.     Heart sounds: No murmur heard.    No friction rub. No gallop.  Pulmonary:     Effort: Pulmonary effort is normal. No respiratory distress.     Breath sounds: Normal breath sounds. No wheezing, rhonchi or rales.  Skin:    General: Skin is warm and dry.  Neurological:     Mental Status: She is alert and oriented to person, place, and time.       Assessment & Plan:  Productive cough -     DG Chest 2 View  Acute respiratory infection -     Albuterol Sulfate HFA; Inhale 2 puffs into the lungs every 6 (six) hours as needed for wheezing.  Dispense: 2 each; Refill: 0  No  adventitious breath sounds on exam including wheezes, rales, or rhonchi.  Ordering chest x-ray to rule out pneumonia since imaging has not been done since symptoms started.  In the meantime, continue symptomatic management with Tessalon Perles, regular Mucinex, and sending albuterol inhaler for shortness of breath if needed.  If chest x-ray positive for pneumonia, consider intensifying antibiotic therapy to respiratory fluoroquinolone.  If negative for pneumonia, most likely diagnosis bronchitis and will continue with symptomatic management and close follow-up.  Return if symptoms worsen or fail to improve.  Melida Quitter, PA

## 2023-09-27 NOTE — Patient Instructions (Signed)
Continue with drinking a ton of water, using Mucinex and Tessalon Perles.  You can also continue with Tylenol for any headache or fever.  Once we have the results of the chest x-ray, that we will give some clear guidance as to what else we can do moving forward.

## 2023-09-28 ENCOUNTER — Ambulatory Visit
Admission: RE | Admit: 2023-09-28 | Discharge: 2023-09-28 | Disposition: A | Discharge status: Home / Self Care | Payer: Medicare Other | Source: Ambulatory Visit | Attending: Family Medicine

## 2023-09-28 ENCOUNTER — Other Ambulatory Visit (HOSPITAL_COMMUNITY): Payer: Self-pay

## 2023-09-28 ENCOUNTER — Encounter: Payer: Self-pay | Admitting: Family Medicine

## 2023-09-28 DIAGNOSIS — R059 Cough, unspecified: Secondary | ICD-10-CM | POA: Diagnosis not present

## 2023-10-01 ENCOUNTER — Encounter: Payer: Self-pay | Admitting: Family Medicine

## 2023-10-01 MED ORDER — GUAIFENESIN 200 MG PO TABS: 200.0000 mg | ORAL_TABLET | ORAL | 0 refills | Status: DC

## 2023-10-02 ENCOUNTER — Other Ambulatory Visit: Payer: Self-pay | Admitting: Family Medicine

## 2023-10-02 ENCOUNTER — Other Ambulatory Visit: Payer: Self-pay

## 2023-10-02 ENCOUNTER — Other Ambulatory Visit (HOSPITAL_COMMUNITY): Payer: Self-pay

## 2023-10-02 DIAGNOSIS — E1169 Type 2 diabetes mellitus with other specified complication: Secondary | ICD-10-CM

## 2023-10-02 MED ORDER — OZEMPIC (2 MG/DOSE) 8 MG/3ML ~~LOC~~ SOPN
2.0000 mg | PEN_INJECTOR | SUBCUTANEOUS | 5 refills | Status: DC
  Filled 2023-10-02 – 2023-10-03 (×2): qty 3, 28d supply, fill #0
  Filled 2023-10-29: qty 3, 28d supply, fill #1
  Filled 2023-11-02 – 2023-11-29 (×2): qty 3, 28d supply, fill #2
  Filled 2023-12-26: qty 3, 28d supply, fill #3
  Filled 2024-01-28: qty 3, 28d supply, fill #4
  Filled 2024-02-21: qty 3, 28d supply, fill #5

## 2023-10-03 ENCOUNTER — Other Ambulatory Visit (HOSPITAL_COMMUNITY): Payer: Self-pay

## 2023-10-03 NOTE — Progress Notes (Signed)
Established Patient Office Visit  Subjective   Patient ID: CYNTIA BESSON, female    DOB: 28-Jun-1956  Age: 67 y.o. MRN: [id number]  Chief Complaint  Patient presents with   Diabetes   Hypertension    HPI TARRIA ASKEY is a 67 y.o. female presenting today for follow up of hypertension, hyperlipidemia, diabetes, hypothyroidism.  She is also requesting a refill of clobetasol for lichen sclerosus.  She does note that she has been sick for the past few weeks and has not been as consistent with her medication routine as she typically is. Hypertension: Patient here for follow-up of elevated blood pressure.  Pt denies chest pain, SOB, dizziness, edema, syncope, fatigue or heart palpitations. Taking hydrochlorothiazide and lisinopril, reports excellent compliance with treatment. Denies side effects. Hyperlipidemia: tolerating ezetimibe well with no myalgias or significant side effects.  The 10-year ASCVD risk score (Arnett DK, et al., 2019) is: 22.8% Diabetes: denies hypoglycemic events, wounds or sores that are not healing well, increased thirst or urination. Denies vision problems, eye exam at [place] in September 2024. Taking Ozempic, metformin, glipizide as prescribed without any side effects.  At last appointment, metformin was changed to 1000 mg twice a day due to elevated A1c. Hypothyroidism: Taking levothyroxine 88 mcg and liothyronine 5 mcg regularly in the AM away from food and vitamins. Denies fatigue, weight changes, heat/cold intolerance, skin/hair changes, bowel changes, CVS symptoms.   Outpatient Medications Prior to Visit  Medication Sig   albuterol (VENTOLIN HFA) 108 (90 Base) MCG/ACT inhaler Inhale 2 puffs into the lungs every 6 (six) hours as needed for wheezing.   blood glucose meter kit and supplies Use to check blood glucose three to four times weekly as directed.   Blood Pressure Monitoring (OMRON 3 SERIES BP MONITOR) DEVI Use as directed   Cholecalciferol (VITAMIN D3 PO) Take 1  tablet by mouth daily.   ezetimibe (ZETIA) 10 MG tablet Take 1 tablet (10 mg total) by mouth daily.   FLUoxetine (PROZAC) 40 MG capsule Take 1 capsule (40 mg total) by mouth daily. Need follow up appt for refills   fluticasone (FLONASE) 50 MCG/ACT nasal spray Place 1 spray into both nostrils daily as needed for allergies or rhinitis.   glipiZIDE (GLUCOTROL) 5 MG tablet Take 1 tablet (5 mg total) by mouth daily before breakfast.   glucose blood (FREESTYLE LITE) test strip Use three to four times weekly as directed.   guaiFENesin 200 MG tablet Take 1 tablet (200 mg total) by mouth every 4 (four) hours.   hydrochlorothiazide (HYDRODIURIL) 50 MG tablet Take 1 tablet (50 mg total) by mouth daily.   Lancets (FREESTYLE) lancets USE TO CHECK BLOOD SUGAR 3 TO 4 TIMES A WEEK AS DIRECTED   levothyroxine (SYNTHROID) 88 MCG tablet Take 1 tablet (88 mcg total) by mouth daily before breakfast.   liothyronine (CYTOMEL) 5 MCG tablet Take 2 tablets by mouth each morning and 1 tablet daily in the afternoon.   lisinopril (ZESTRIL) 40 MG tablet Take 1 tablet (40 mg total) by mouth daily.   metFORMIN (GLUCOPHAGE) 1000 MG tablet Take 1 tablet (1,000 mg total) by mouth 2 (two) times daily with a meal.   mupirocin ointment (BACTROBAN) 2 % Apply 1 application into the nose 2 (two) times daily.   Semaglutide, 2 MG/DOSE, (OZEMPIC, 2 MG/DOSE,) 8 MG/3ML SOPN Inject 2 mg as directed once a week.   valACYclovir (VALTREX) 500 MG tablet Take 1 tablet (500 mg total) by mouth 2 (two) times daily  for 3 to 5 days as needed for flares.   Vitamin D, Ergocalciferol, (DRISDOL) 1.25 MG (50000 UNIT) CAPS capsule Take 1 capsule (50,000 Units total) by mouth every 7 (seven) days.   No facility-administered medications prior to visit.    ROS Negative unless otherwise noted in HPI   Objective:     BP 137/78 (BP Location: Left Arm, Patient Position: Sitting, Cuff Size: Normal)   Pulse 72   Resp 18   Ht 5' 4.96" (1.65 m)   Wt 163 lb  (73.9 kg)   LMP  (LMP Unknown)   SpO2 98%   BMI 27.16 kg/m   Physical Exam Constitutional:      General: She is not in acute distress.    Appearance: Normal appearance.  HENT:     Head: Normocephalic and atraumatic.  Cardiovascular:     Rate and Rhythm: Normal rate and regular rhythm.     Heart sounds: No murmur heard.    No friction rub. No gallop.  Pulmonary:     Effort: Pulmonary effort is normal. No respiratory distress.     Breath sounds: No wheezing, rhonchi or rales.  Skin:    General: Skin is warm and dry.  Neurological:     Mental Status: She is alert and oriented to person, place, and time.      Assessment & Plan:  Hypertension associated with diabetes (HCC) Assessment & Plan: BP goal <130/80. Continue hydrochlorothiazide 50 mg daily, lisinopril 40 mg daily.  Blood pressure close stable in spite of being sick and history of whitecoat hypertension in the office, continue ambulatory blood pressure monitoring.  Checking CMP for medication monitoring today.  Will continue to monitor.  Orders: -     CBC with Differential/Platelet; Future -     Comprehensive metabolic panel; Future  Hyperlipidemia associated with type 2 diabetes mellitus (HCC) Assessment & Plan: Last lipid panel: LDL 98, HDL 55, triglycerides 171.  LDL above goal of 70, triglycerides remain elevated.  Repeating lipid panel today.  Continue Zetia 10 mg daily and low-fat/low-carb diet.  Will adjust medication regimen as indicated by labs.  Orders: -     CBC with Differential/Platelet; Future -     Comprehensive metabolic panel; Future -     Lipid panel; Future  Type 2 diabetes mellitus with other specified complication, unspecified whether long term insulin use (HCC) Assessment & Plan: Last A1c 8.8, rechecking today.  Continue Ozempic 2 mg weekly, metformin 1000 mg twice daily, if A1c remains elevated increase glipizide to 10 mg daily before breakfast, if needed can further increase to 10 mg twice  daily before meals.  Will continue to monitor.  Recommend referral to endocrinology if patient remains above goal even with maximum doses of Ozempic, metformin, and glipizide.  Orders: -     CBC with Differential/Platelet; Future -     Comprehensive metabolic panel; Future -     Hemoglobin A1c; Future  Hypothyroidism, unspecified type Assessment & Plan: Repeating TSH, T4, and T3 today.  Will adjust medication depending on lab results, otherwise continue levothyroxine 88 mcg daily and liothyronine 10 mcg every morning and 5 mcg every evening.  Orders: -     TSH; Future -     T4, free; Future -     T3, free; Future  Lichen sclerosus of vulva -     Clobetasol Propionate; Apply topically 2 (two) times daily. Maximum 2 weeks in a row.  Dispense: 45 g; Refill: 3  Patient was contacted  by the breast center recently to schedule mammogram.  Discussed recommendation for pneumonia vaccine, will wait for now given that patient is still experiencing nasal congestion today.  Return in about 3 months (around 01/03/2024) for follow-up for HTN, HLD, DM, POC A1C at visit.    Melida Quitter, PA

## 2023-10-05 ENCOUNTER — Other Ambulatory Visit: Payer: Self-pay

## 2023-10-05 ENCOUNTER — Ambulatory Visit (INDEPENDENT_AMBULATORY_CARE_PROVIDER_SITE_OTHER): Payer: Medicare Other | Admitting: Family Medicine

## 2023-10-05 ENCOUNTER — Encounter: Payer: Self-pay | Admitting: Family Medicine

## 2023-10-05 VITALS — BP 137/78 | HR 72 | Resp 18 | Ht 64.96 in | Wt 163.0 lb

## 2023-10-05 DIAGNOSIS — E039 Hypothyroidism, unspecified: Secondary | ICD-10-CM | POA: Diagnosis not present

## 2023-10-05 DIAGNOSIS — E785 Hyperlipidemia, unspecified: Secondary | ICD-10-CM | POA: Diagnosis not present

## 2023-10-05 DIAGNOSIS — I152 Hypertension secondary to endocrine disorders: Secondary | ICD-10-CM | POA: Diagnosis not present

## 2023-10-05 DIAGNOSIS — E1169 Type 2 diabetes mellitus with other specified complication: Secondary | ICD-10-CM

## 2023-10-05 DIAGNOSIS — E1159 Type 2 diabetes mellitus with other circulatory complications: Secondary | ICD-10-CM

## 2023-10-05 DIAGNOSIS — Z7984 Long term (current) use of oral hypoglycemic drugs: Secondary | ICD-10-CM

## 2023-10-05 DIAGNOSIS — N904 Leukoplakia of vulva: Secondary | ICD-10-CM

## 2023-10-05 MED ORDER — CLOBETASOL PROPIONATE 0.05 % EX CREA
TOPICAL_CREAM | Freq: Two times a day (BID) | CUTANEOUS | 3 refills | Status: AC
  Filled 2023-10-05: qty 45, 30d supply, fill #0
  Filled 2023-11-22: qty 45, 30d supply, fill #1
  Filled 2024-03-11: qty 45, 30d supply, fill #2
  Filled 2024-08-14: qty 45, 30d supply, fill #3

## 2023-10-05 NOTE — Assessment & Plan Note (Signed)
Last A1c 8.8, rechecking today.  Continue Ozempic 2 mg weekly, metformin 1000 mg twice daily, if A1c remains elevated increase glipizide to 10 mg daily before breakfast, if needed can further increase to 10 mg twice daily before meals.  Will continue to monitor.  Recommend referral to endocrinology if patient remains above goal even with maximum doses of Ozempic, metformin, and glipizide.

## 2023-10-05 NOTE — Patient Instructions (Signed)
Continue checking your blood pressure particularly after you feel better!  Please let me know if it remains elevated at that time or returns closer to your baseline once you are no longer sick.  Once her lab results are back, I will send refills of all of your medications to the pharmacy at the same time.

## 2023-10-05 NOTE — Assessment & Plan Note (Signed)
Last lipid panel: LDL 98, HDL 55, triglycerides 171.  LDL above goal of 70, triglycerides remain elevated.  Repeating lipid panel today.  Continue Zetia 10 mg daily and low-fat/low-carb diet.  Will adjust medication regimen as indicated by labs.

## 2023-10-05 NOTE — Assessment & Plan Note (Addendum)
BP goal <130/80. Continue hydrochlorothiazide 50 mg daily, lisinopril 40 mg daily.  Blood pressure close stable in spite of being sick and history of whitecoat hypertension in the office, continue ambulatory blood pressure monitoring.  Checking CMP for medication monitoring today.  Will continue to monitor.

## 2023-10-05 NOTE — Assessment & Plan Note (Signed)
Repeating TSH, T4, and T3 today.  Will adjust medication depending on lab results, otherwise continue levothyroxine 88 mcg daily and liothyronine 10 mcg every morning and 5 mcg every evening.

## 2023-10-06 LAB — LIPID PANEL
Chol/HDL Ratio: 3.7 {ratio} (ref 0.0–4.4)
Cholesterol, Total: 206 mg/dL — ABNORMAL HIGH (ref 100–199)
HDL: 55 mg/dL (ref >39)
LDL Chol Calc (NIH): 121 mg/dL — ABNORMAL HIGH (ref 0–99)
Triglycerides: 172 mg/dL — ABNORMAL HIGH (ref 0–149)
VLDL Cholesterol Cal: 30 mg/dL (ref 5–40)

## 2023-10-06 LAB — T4, FREE: Free T4: 0.86 ng/dL (ref 0.82–1.77)

## 2023-10-06 LAB — CBC WITH DIFFERENTIAL/PLATELET
Basophils Absolute: 0.1 10*3/uL (ref 0.0–0.2)
Basos: 1 %
EOS (ABSOLUTE): 0.4 10*3/uL (ref 0.0–0.4)
Eos: 5 %
Hematocrit: 40.2 % (ref 34.0–46.6)
Hemoglobin: 13 g/dL (ref 11.1–15.9)
Immature Grans (Abs): 0.1 10*3/uL (ref 0.0–0.1)
Immature Granulocytes: 1 %
Lymphocytes Absolute: 1.5 10*3/uL (ref 0.7–3.1)
Lymphs: 19 %
MCH: 28.4 pg (ref 26.6–33.0)
MCHC: 32.3 g/dL (ref 31.5–35.7)
MCV: 88 fL (ref 79–97)
Monocytes Absolute: 0.4 10*3/uL (ref 0.1–0.9)
Monocytes: 5 %
Neutrophils Absolute: 5.4 10*3/uL (ref 1.4–7.0)
Neutrophils: 69 %
Platelets: 410 10*3/uL (ref 150–450)
RBC: 4.57 x10E6/uL (ref 3.77–5.28)
RDW: 12.6 % (ref 11.7–15.4)
WBC: 7.8 10*3/uL (ref 3.4–10.8)

## 2023-10-06 LAB — COMPREHENSIVE METABOLIC PANEL
ALT: 28 [IU]/L (ref 0–32)
AST: 33 [IU]/L (ref 0–40)
Albumin: 4.3 g/dL (ref 3.9–4.9)
Alkaline Phosphatase: 107 [IU]/L (ref 44–121)
BUN/Creatinine Ratio: 13 (ref 12–28)
BUN: 9 mg/dL (ref 8–27)
Bilirubin Total: 0.3 mg/dL (ref 0.0–1.2)
CO2: 25 mmol/L (ref 20–29)
Calcium: 9.7 mg/dL (ref 8.7–10.3)
Chloride: 90 mmol/L — ABNORMAL LOW (ref 96–106)
Creatinine, Ser: 0.71 mg/dL (ref 0.57–1.00)
Globulin, Total: 2.7 g/dL (ref 1.5–4.5)
Glucose: 271 mg/dL — ABNORMAL HIGH (ref 70–99)
Potassium: 3.7 mmol/L (ref 3.5–5.2)
Sodium: 132 mmol/L — ABNORMAL LOW (ref 134–144)
Total Protein: 7 g/dL (ref 6.0–8.5)
eGFR: 92 mL/min/{1.73_m2} (ref >59)

## 2023-10-06 LAB — HEMOGLOBIN A1C
Est. average glucose Bld gHb Est-mCnc: 258 mg/dL
Hgb A1c MFr Bld: 10.6 % — ABNORMAL HIGH (ref 4.8–5.6)

## 2023-10-06 LAB — TSH: TSH: 9.95 u[IU]/mL — ABNORMAL HIGH (ref 0.450–4.500)

## 2023-10-06 LAB — T3, FREE: T3, Free: 2.7 pg/mL (ref 2.0–4.4)

## 2023-10-11 ENCOUNTER — Encounter: Payer: Self-pay | Admitting: Family Medicine

## 2023-10-12 ENCOUNTER — Other Ambulatory Visit: Payer: Self-pay | Admitting: Family Medicine

## 2023-10-12 DIAGNOSIS — E1169 Type 2 diabetes mellitus with other specified complication: Secondary | ICD-10-CM

## 2023-10-12 DIAGNOSIS — E039 Hypothyroidism, unspecified: Secondary | ICD-10-CM

## 2023-10-16 ENCOUNTER — Other Ambulatory Visit (HOSPITAL_COMMUNITY): Payer: Self-pay

## 2023-10-26 ENCOUNTER — Encounter: Payer: Self-pay | Admitting: Hematology

## 2023-10-26 ENCOUNTER — Other Ambulatory Visit (HOSPITAL_COMMUNITY): Payer: Self-pay

## 2023-10-29 ENCOUNTER — Other Ambulatory Visit (HOSPITAL_BASED_OUTPATIENT_CLINIC_OR_DEPARTMENT_OTHER): Payer: Self-pay

## 2023-10-29 ENCOUNTER — Encounter: Payer: Self-pay | Admitting: Hematology

## 2023-10-29 ENCOUNTER — Other Ambulatory Visit (HOSPITAL_COMMUNITY): Payer: Self-pay

## 2023-10-29 ENCOUNTER — Other Ambulatory Visit: Payer: Self-pay

## 2023-10-30 ENCOUNTER — Other Ambulatory Visit (HOSPITAL_COMMUNITY): Payer: Self-pay

## 2023-11-02 ENCOUNTER — Other Ambulatory Visit (HOSPITAL_COMMUNITY): Payer: Self-pay

## 2023-11-04 ENCOUNTER — Other Ambulatory Visit: Payer: Self-pay | Admitting: Family Medicine

## 2023-11-04 DIAGNOSIS — E039 Hypothyroidism, unspecified: Secondary | ICD-10-CM

## 2023-11-05 ENCOUNTER — Other Ambulatory Visit (HOSPITAL_COMMUNITY): Payer: Self-pay

## 2023-11-05 ENCOUNTER — Encounter: Payer: Self-pay | Admitting: Family Medicine

## 2023-11-05 ENCOUNTER — Other Ambulatory Visit: Payer: Self-pay

## 2023-11-05 DIAGNOSIS — E1169 Type 2 diabetes mellitus with other specified complication: Secondary | ICD-10-CM

## 2023-11-05 MED ORDER — FREESTYLE LITE TEST VI STRP
ORAL_STRIP | 12 refills | Status: DC
  Filled 2023-11-05: qty 100, 90d supply, fill #0

## 2023-11-05 MED ORDER — LEVOTHYROXINE SODIUM 88 MCG PO TABS
88.0000 ug | ORAL_TABLET | Freq: Every day | ORAL | 0 refills | Status: DC
  Filled 2023-11-05: qty 90, 90d supply, fill #0

## 2023-11-06 ENCOUNTER — Other Ambulatory Visit: Payer: Self-pay

## 2023-11-06 ENCOUNTER — Other Ambulatory Visit (HOSPITAL_COMMUNITY): Payer: Self-pay

## 2023-11-06 ENCOUNTER — Other Ambulatory Visit: Payer: Self-pay | Admitting: Family Medicine

## 2023-11-06 DIAGNOSIS — E1169 Type 2 diabetes mellitus with other specified complication: Secondary | ICD-10-CM

## 2023-11-06 MED ORDER — BLOOD GLUCOSE TEST VI STRP
1.0000 | ORAL_STRIP | Freq: Three times a day (TID) | 0 refills | Status: AC
  Filled 2023-11-06: qty 100, 34d supply, fill #0

## 2023-11-06 MED ORDER — ONETOUCH DELICA LANCETS 33G MISC
1.0000 | Freq: Three times a day (TID) | 0 refills | Status: AC
  Filled 2023-11-06: qty 100, 30d supply, fill #0
  Filled 2023-11-06: qty 100, 33d supply, fill #0

## 2023-11-06 MED ORDER — ONETOUCH DELICA PLUS LANCING MISC
1.0000 | Freq: Three times a day (TID) | 0 refills | Status: AC
  Filled 2023-11-06 – 2023-11-22 (×2): qty 1, 30d supply, fill #0

## 2023-11-06 MED ORDER — BLOOD GLUCOSE MONITOR SYSTEM W/DEVICE KIT
1.0000 | PACK | Freq: Three times a day (TID) | 0 refills | Status: AC
  Filled 2023-11-06: qty 1, 30d supply, fill #0

## 2023-11-06 MED ORDER — LANCETS MISC. MISC
1.0000 | Freq: Three times a day (TID) | 0 refills | Status: DC
  Filled 2023-11-06: qty 100, 30d supply, fill #0

## 2023-11-17 ENCOUNTER — Other Ambulatory Visit: Payer: Self-pay | Admitting: Family Medicine

## 2023-11-17 DIAGNOSIS — J22 Unspecified acute lower respiratory infection: Secondary | ICD-10-CM

## 2023-11-22 ENCOUNTER — Other Ambulatory Visit (HOSPITAL_COMMUNITY): Payer: Self-pay

## 2023-11-22 ENCOUNTER — Other Ambulatory Visit: Payer: Self-pay | Admitting: Family Medicine

## 2023-11-22 ENCOUNTER — Encounter: Payer: Self-pay | Admitting: Family Medicine

## 2023-11-22 ENCOUNTER — Other Ambulatory Visit: Payer: Self-pay | Admitting: Nurse Practitioner

## 2023-11-22 ENCOUNTER — Other Ambulatory Visit: Payer: Self-pay

## 2023-11-22 DIAGNOSIS — J3481 Nasal mucositis (ulcerative): Secondary | ICD-10-CM

## 2023-11-22 DIAGNOSIS — E039 Hypothyroidism, unspecified: Secondary | ICD-10-CM

## 2023-11-22 DIAGNOSIS — E1169 Type 2 diabetes mellitus with other specified complication: Secondary | ICD-10-CM

## 2023-11-22 DIAGNOSIS — I152 Hypertension secondary to endocrine disorders: Secondary | ICD-10-CM

## 2023-11-22 MED ORDER — LISINOPRIL 40 MG PO TABS
40.0000 mg | ORAL_TABLET | Freq: Every day | ORAL | 1 refills | Status: DC
  Filled 2023-11-22 – 2023-11-23 (×3): qty 90, 90d supply, fill #0
  Filled 2024-02-21: qty 90, 90d supply, fill #1

## 2023-11-22 MED ORDER — LIOTHYRONINE SODIUM 5 MCG PO TABS
ORAL_TABLET | ORAL | 2 refills | Status: DC
  Filled 2023-11-22 – 2023-11-23 (×2): qty 90, 30d supply, fill #0
  Filled 2023-12-23: qty 90, 30d supply, fill #1
  Filled 2024-02-04: qty 90, 30d supply, fill #2

## 2023-11-22 MED ORDER — EZETIMIBE 10 MG PO TABS
10.0000 mg | ORAL_TABLET | Freq: Every day | ORAL | 1 refills | Status: DC
  Filled 2023-11-22 – 2023-11-23 (×2): qty 90, 90d supply, fill #0
  Filled 2024-02-21: qty 90, 90d supply, fill #1

## 2023-11-23 ENCOUNTER — Other Ambulatory Visit (HOSPITAL_COMMUNITY): Payer: Self-pay

## 2023-11-23 ENCOUNTER — Other Ambulatory Visit: Payer: Self-pay

## 2023-11-29 ENCOUNTER — Encounter: Payer: Self-pay | Admitting: Family Medicine

## 2023-12-18 ENCOUNTER — Other Ambulatory Visit: Payer: Self-pay | Admitting: Family Medicine

## 2023-12-18 ENCOUNTER — Other Ambulatory Visit (HOSPITAL_COMMUNITY): Payer: Self-pay

## 2023-12-18 DIAGNOSIS — E1169 Type 2 diabetes mellitus with other specified complication: Secondary | ICD-10-CM

## 2023-12-18 DIAGNOSIS — F4323 Adjustment disorder with mixed anxiety and depressed mood: Secondary | ICD-10-CM

## 2023-12-18 MED ORDER — GLIPIZIDE 5 MG PO TABS
5.0000 mg | ORAL_TABLET | Freq: Every day | ORAL | 1 refills | Status: DC
  Filled 2023-12-18: qty 90, 90d supply, fill #0
  Filled 2024-03-17: qty 90, 90d supply, fill #1

## 2023-12-18 MED ORDER — FLUOXETINE HCL 40 MG PO CAPS
40.0000 mg | ORAL_CAPSULE | Freq: Every day | ORAL | 0 refills | Status: DC
  Filled 2023-12-18: qty 90, 90d supply, fill #0

## 2023-12-18 MED ORDER — METFORMIN HCL 1000 MG PO TABS
1000.0000 mg | ORAL_TABLET | Freq: Two times a day (BID) | ORAL | 1 refills | Status: DC
  Filled 2023-12-18: qty 180, 90d supply, fill #0
  Filled 2024-03-17: qty 180, 90d supply, fill #1

## 2023-12-25 ENCOUNTER — Other Ambulatory Visit (HOSPITAL_COMMUNITY): Payer: Self-pay

## 2023-12-26 ENCOUNTER — Other Ambulatory Visit (HOSPITAL_COMMUNITY): Payer: Self-pay

## 2023-12-28 ENCOUNTER — Other Ambulatory Visit (HOSPITAL_COMMUNITY): Payer: Self-pay

## 2023-12-31 ENCOUNTER — Encounter: Payer: Self-pay | Admitting: Family Medicine

## 2023-12-31 ENCOUNTER — Other Ambulatory Visit: Payer: Self-pay

## 2023-12-31 ENCOUNTER — Telehealth: Payer: Self-pay | Admitting: *Deleted

## 2023-12-31 ENCOUNTER — Other Ambulatory Visit (HOSPITAL_COMMUNITY): Payer: Self-pay

## 2023-12-31 ENCOUNTER — Encounter: Payer: Self-pay | Admitting: Hematology

## 2023-12-31 NOTE — Telephone Encounter (Signed)
 Copied from CRM [id number]. Topic: Clinical - Prescription Issue >> Dec 28, 2023  2:05 PM Marlow Baars wrote: Reason for CRM: The patient called in stating her Semaglutide, 2 MG/DOSE, (OZEMPIC, 2 MG/DOSE,) 8 MG/3ML SOPN requires a prior authorization through her [employer]. She states he next shot is due on Sunday but sometimes she waits until Monday. Please assist as soon as possible

## 2024-01-03 ENCOUNTER — Other Ambulatory Visit

## 2024-01-03 ENCOUNTER — Encounter: Payer: Self-pay | Admitting: Hematology

## 2024-01-03 DIAGNOSIS — E1169 Type 2 diabetes mellitus with other specified complication: Secondary | ICD-10-CM

## 2024-01-03 DIAGNOSIS — E039 Hypothyroidism, unspecified: Secondary | ICD-10-CM | POA: Diagnosis not present

## 2024-01-03 DIAGNOSIS — E785 Hyperlipidemia, unspecified: Secondary | ICD-10-CM | POA: Diagnosis not present

## 2024-01-04 ENCOUNTER — Ambulatory Visit: Payer: Medicare Other | Admitting: Family Medicine

## 2024-01-04 LAB — LIPID PANEL
Chol/HDL Ratio: 3.2 ratio (ref 0.0–4.4)
Cholesterol, Total: 159 mg/dL (ref 100–199)
HDL: 50 mg/dL (ref >39)
LDL Chol Calc (NIH): 80 mg/dL (ref 0–99)
Triglycerides: 170 mg/dL — ABNORMAL HIGH (ref 0–149)
VLDL Cholesterol Cal: 29 mg/dL (ref 5–40)

## 2024-01-04 LAB — HEMOGLOBIN A1C
Est. average glucose Bld gHb Est-mCnc: 177 mg/dL
Hgb A1c MFr Bld: 7.8 % — ABNORMAL HIGH (ref 4.8–5.6)

## 2024-01-04 LAB — TSH: TSH: 0.261 u[IU]/mL — ABNORMAL LOW (ref 0.450–4.500)

## 2024-01-09 ENCOUNTER — Other Ambulatory Visit (HOSPITAL_COMMUNITY): Payer: Self-pay

## 2024-01-09 ENCOUNTER — Ambulatory Visit (INDEPENDENT_AMBULATORY_CARE_PROVIDER_SITE_OTHER): Admitting: Family Medicine

## 2024-01-09 VITALS — BP 119/68 | HR 80 | Ht 64.96 in | Wt 159.0 lb

## 2024-01-09 DIAGNOSIS — E1159 Type 2 diabetes mellitus with other circulatory complications: Secondary | ICD-10-CM | POA: Diagnosis not present

## 2024-01-09 DIAGNOSIS — R198 Other specified symptoms and signs involving the digestive system and abdomen: Secondary | ICD-10-CM | POA: Diagnosis not present

## 2024-01-09 DIAGNOSIS — E785 Hyperlipidemia, unspecified: Secondary | ICD-10-CM | POA: Diagnosis not present

## 2024-01-09 DIAGNOSIS — Z780 Asymptomatic menopausal state: Secondary | ICD-10-CM

## 2024-01-09 DIAGNOSIS — I152 Hypertension secondary to endocrine disorders: Secondary | ICD-10-CM | POA: Diagnosis not present

## 2024-01-09 DIAGNOSIS — E1169 Type 2 diabetes mellitus with other specified complication: Secondary | ICD-10-CM | POA: Diagnosis not present

## 2024-01-09 DIAGNOSIS — Z7984 Long term (current) use of oral hypoglycemic drugs: Secondary | ICD-10-CM | POA: Diagnosis not present

## 2024-01-09 DIAGNOSIS — Z7985 Long-term (current) use of injectable non-insulin antidiabetic drugs: Secondary | ICD-10-CM

## 2024-01-09 MED ORDER — ESTROGENS CONJUGATED 0.625 MG/GM VA CREA
1.0000 | TOPICAL_CREAM | Freq: Every day | VAGINAL | 12 refills | Status: AC
  Filled 2024-01-09: qty 30, 30d supply, fill #0

## 2024-01-09 NOTE — Assessment & Plan Note (Signed)
 Prescribing vaginal estrogen.

## 2024-01-09 NOTE — Assessment & Plan Note (Addendum)
 Blood pressure at goal.  Continue lisinopril

## 2024-01-09 NOTE — Progress Notes (Signed)
 See I do not good  Established Patient Office Visit  Subjective   Patient ID: Robin Greene, female    DOB: 05-28-1956  Age: 68 y.o. MRN: [id number]  Chief Complaint  Patient presents with   Diabetes   Hypertension   Hyperlipidemia    HPI Patient was having difficulty with taking her medicine late last year due to being ill for several weeks and some stressful events going on in her life.  She has had to move in with her father to help take care of him.  Has since been more compliant with her medications and her lab work has improved.  Cholesterol improved significantly as did her A1c.  Patient is taking Ozempic.  We discussed the patient's TSH levels.  She is agreeable to holding the medication for 1 day/week and rechecking in 3 months.  Patient feels like she is having issues with diarrhea/fecal urgency.  Usually needs to go to the bathroom 30 minutes after eating a meal.  She has stopped eating fatty/greasy foods but still continues to have symptoms.  Symptoms initially started after her [employer] fundoplication surgery.  Patient still has gallbladder. .   The 10-year ASCVD risk score (Arnett DK, et al., 2019) is: 17.3%  Health Maintenance Due  Topic Date Due   Medicare Annual Wellness (AWV)  Never done   Pneumonia Vaccine 37+ Years old (3 of 3 - PPSV23 or PCV20) 04/24/2019   DEXA SCAN  07/22/2021   COVID-19 Vaccine (1 - 2024-25 season) Never done   MAMMOGRAM  08/06/2023   Diabetic kidney evaluation - Urine ACR  01/05/2024   FOOT EXAM  01/05/2024      Objective:     BP 119/68   Pulse 80   Ht 5' 4.96" (1.65 m)   Wt 159 lb (72.1 kg)   LMP  (LMP Unknown)   SpO2 98%   BMI 26.49 kg/m    Physical Exam General: Alert, oriented CV: Regular rhythm Pulmonary: Lungs clear bilaterally Psych: Pleasant affect   No results found for any visits on 01/09/24.      Assessment & Plan:   Type 2 diabetes mellitus with other specified complication, unspecified whether long term  insulin use (HCC) Assessment & Plan: Continue Ozempic and metformin.  UACR today.  Orders: -     Microalbumin / creatinine urine ratio  Hyperlipidemia associated with type 2 diabetes mellitus (HCC) Assessment & Plan: Lipid profile much better now that she has resumed taking her medications regularly.  Continue Zetia.   Hypertension associated with diabetes (HCC) Assessment & Plan: Blood pressure at goal.  Continue lisinopril.   Postmenopausal Assessment & Plan: Prescribing vaginal estrogen.   Irregular bowel habits Assessment & Plan: Postprandial fecal urgency and episodic diarrhea which patient first noticed after [employer] fundoplication.  Will get TTG, fecal calprotectin, GI pathogen panel.  If negative consider referral to gastroenterology.  Orders: -     Celiac Disease Comprehensive Panel with Reflexes; Future -     Calprotectin, Fecal; Future -     GI Profile, Stool, PCR; Future  Other orders -     Estrogens Conjugated; Place 1 Applicatorful vaginally daily.  Dispense: 42.5 g; Refill: 12     Return in about 3 months (around 04/10/2024) for Thyroid, GI.    Sandre Kitty, MD

## 2024-01-09 NOTE — Assessment & Plan Note (Signed)
 Postprandial fecal urgency and episodic diarrhea which patient first noticed after [employer] fundoplication.  Will get TTG, fecal calprotectin, GI pathogen panel.  If negative consider referral to gastroenterology.

## 2024-01-09 NOTE — Assessment & Plan Note (Signed)
 Continue Ozempic and metformin.  UACR today.

## 2024-01-09 NOTE — Assessment & Plan Note (Signed)
 Lipid profile much better now that she has resumed taking her medications regularly.  Continue Zetia.

## 2024-01-09 NOTE — Patient Instructions (Addendum)
 It was nice to see you today,  We addressed the following topics today: -I will send in the vaginal estrogen cream.  Use as directed - I would like you to hold to the 88 mcg levothyroxine dose on 1 day/week.  In 3 months we will recheck it - I would like you to schedule a lab visit which you can get tested for gluten intolerance.  You can also bring back the stool sample at that time so we can test that   Have a great day,  Frederic Jericho, MD

## 2024-01-10 ENCOUNTER — Encounter: Payer: Self-pay | Admitting: Family Medicine

## 2024-01-10 LAB — MICROALBUMIN / CREATININE URINE RATIO
Creatinine, Urine: 112.2 mg/dL
Microalb/Creat Ratio: 8 mg/g{creat} (ref 0–29)
Microalbumin, Urine: 8.5 ug/mL

## 2024-01-16 ENCOUNTER — Other Ambulatory Visit: Payer: Self-pay | Admitting: *Deleted

## 2024-01-16 ENCOUNTER — Other Ambulatory Visit

## 2024-01-16 ENCOUNTER — Other Ambulatory Visit (HOSPITAL_COMMUNITY): Payer: Self-pay

## 2024-01-16 DIAGNOSIS — R198 Other specified symptoms and signs involving the digestive system and abdomen: Secondary | ICD-10-CM | POA: Diagnosis not present

## 2024-01-20 LAB — GI PROFILE, STOOL, PCR

## 2024-01-20 LAB — CELIAC DISEASE COMPREHENSIVE PANEL WITH REFLEXES
IgA/Immunoglobulin A, Serum: 162 mg/dL (ref 87–352)
Transglutaminase IgA: <2 U/mL (ref 0–3)

## 2024-01-20 LAB — CALPROTECTIN, FECAL: Calprotectin, Fecal: 97 ug/g (ref 0–120)

## 2024-01-28 ENCOUNTER — Other Ambulatory Visit (HOSPITAL_COMMUNITY): Payer: Self-pay

## 2024-01-31 ENCOUNTER — Ambulatory Visit: Payer: Medicare Other

## 2024-02-04 ENCOUNTER — Other Ambulatory Visit (HOSPITAL_COMMUNITY): Payer: Self-pay

## 2024-02-04 ENCOUNTER — Other Ambulatory Visit: Payer: Self-pay | Admitting: Family Medicine

## 2024-02-04 DIAGNOSIS — E039 Hypothyroidism, unspecified: Secondary | ICD-10-CM

## 2024-02-05 ENCOUNTER — Other Ambulatory Visit (HOSPITAL_COMMUNITY): Payer: Self-pay

## 2024-02-05 DIAGNOSIS — E1169 Type 2 diabetes mellitus with other specified complication: Secondary | ICD-10-CM | POA: Diagnosis not present

## 2024-02-05 DIAGNOSIS — E039 Hypothyroidism, unspecified: Secondary | ICD-10-CM | POA: Diagnosis not present

## 2024-02-05 DIAGNOSIS — F324 Major depressive disorder, single episode, in partial remission: Secondary | ICD-10-CM | POA: Diagnosis not present

## 2024-02-05 DIAGNOSIS — E785 Hyperlipidemia, unspecified: Secondary | ICD-10-CM | POA: Diagnosis not present

## 2024-02-05 DIAGNOSIS — E1122 Type 2 diabetes mellitus with diabetic chronic kidney disease: Secondary | ICD-10-CM | POA: Diagnosis not present

## 2024-02-05 DIAGNOSIS — I129 Hypertensive chronic kidney disease with stage 1 through stage 4 chronic kidney disease, or unspecified chronic kidney disease: Secondary | ICD-10-CM | POA: Diagnosis not present

## 2024-02-05 DIAGNOSIS — E663 Overweight: Secondary | ICD-10-CM | POA: Diagnosis not present

## 2024-02-05 DIAGNOSIS — N189 Chronic kidney disease, unspecified: Secondary | ICD-10-CM | POA: Diagnosis not present

## 2024-02-05 MED ORDER — LEVOTHYROXINE SODIUM 88 MCG PO TABS
ORAL_TABLET | ORAL | 1 refills | Status: DC
Start: 2024-02-05 — End: 2024-07-14
  Filled 2024-02-05: qty 90, 100d supply, fill #0
  Filled 2024-05-14: qty 90, 100d supply, fill #1

## 2024-02-12 ENCOUNTER — Encounter: Payer: Self-pay | Admitting: Hematology

## 2024-02-12 ENCOUNTER — Other Ambulatory Visit (HOSPITAL_COMMUNITY): Payer: Self-pay

## 2024-02-12 DIAGNOSIS — M791 Myalgia, unspecified site: Secondary | ICD-10-CM | POA: Diagnosis not present

## 2024-02-12 DIAGNOSIS — M542 Cervicalgia: Secondary | ICD-10-CM | POA: Diagnosis not present

## 2024-02-12 DIAGNOSIS — M25512 Pain in left shoulder: Secondary | ICD-10-CM | POA: Diagnosis not present

## 2024-02-12 MED ORDER — CYCLOBENZAPRINE HCL 10 MG PO TABS
ORAL_TABLET | ORAL | 0 refills | Status: DC
Start: 2024-02-12 — End: 2024-07-14
  Filled 2024-02-12: qty 14, 5d supply, fill #0

## 2024-02-12 MED ORDER — PREDNISONE 20 MG PO TABS
ORAL_TABLET | ORAL | 0 refills | Status: DC
Start: 2024-02-12 — End: 2024-07-14
  Filled 2024-02-12: qty 12, 6d supply, fill #0

## 2024-02-13 ENCOUNTER — Other Ambulatory Visit (HOSPITAL_COMMUNITY): Payer: Self-pay

## 2024-02-21 ENCOUNTER — Other Ambulatory Visit (HOSPITAL_COMMUNITY): Payer: Self-pay

## 2024-02-21 ENCOUNTER — Other Ambulatory Visit: Payer: Self-pay | Admitting: Family Medicine

## 2024-02-21 ENCOUNTER — Encounter: Payer: Self-pay | Admitting: Family Medicine

## 2024-02-21 DIAGNOSIS — E1159 Type 2 diabetes mellitus with other circulatory complications: Secondary | ICD-10-CM

## 2024-02-21 MED ORDER — LANCETS MISC
3 refills | Status: AC
  Filled 2024-02-21: qty 100, 90d supply, fill #0

## 2024-02-21 MED ORDER — HYDROCHLOROTHIAZIDE 50 MG PO TABS
50.0000 mg | ORAL_TABLET | Freq: Every day | ORAL | 1 refills | Status: DC
  Filled 2024-02-21: qty 90, 90d supply, fill #0
  Filled 2024-05-20: qty 90, 90d supply, fill #1

## 2024-02-28 ENCOUNTER — Other Ambulatory Visit (HOSPITAL_COMMUNITY): Payer: Self-pay

## 2024-02-28 DIAGNOSIS — M546 Pain in thoracic spine: Secondary | ICD-10-CM | POA: Diagnosis not present

## 2024-02-28 DIAGNOSIS — M4722 Other spondylosis with radiculopathy, cervical region: Secondary | ICD-10-CM | POA: Diagnosis not present

## 2024-02-28 DIAGNOSIS — M791 Myalgia, unspecified site: Secondary | ICD-10-CM | POA: Diagnosis not present

## 2024-02-28 DIAGNOSIS — M542 Cervicalgia: Secondary | ICD-10-CM | POA: Diagnosis not present

## 2024-02-28 MED ORDER — GABAPENTIN 100 MG PO CAPS
100.0000 mg | ORAL_CAPSULE | Freq: Every day | ORAL | 0 refills | Status: DC
Start: 2024-02-28 — End: 2024-05-07
  Filled 2024-02-28: qty 30, 30d supply, fill #0

## 2024-02-29 ENCOUNTER — Other Ambulatory Visit: Payer: Self-pay | Admitting: Orthopedic Surgery

## 2024-02-29 DIAGNOSIS — M546 Pain in thoracic spine: Secondary | ICD-10-CM

## 2024-02-29 DIAGNOSIS — M545 Low back pain, unspecified: Secondary | ICD-10-CM

## 2024-03-03 ENCOUNTER — Other Ambulatory Visit (HOSPITAL_COMMUNITY): Payer: Self-pay

## 2024-03-11 ENCOUNTER — Other Ambulatory Visit: Payer: Self-pay | Admitting: Family Medicine

## 2024-03-11 ENCOUNTER — Other Ambulatory Visit (HOSPITAL_COMMUNITY): Payer: Self-pay

## 2024-03-11 ENCOUNTER — Other Ambulatory Visit: Payer: Self-pay

## 2024-03-11 DIAGNOSIS — F4323 Adjustment disorder with mixed anxiety and depressed mood: Secondary | ICD-10-CM

## 2024-03-11 DIAGNOSIS — E039 Hypothyroidism, unspecified: Secondary | ICD-10-CM

## 2024-03-11 MED ORDER — LIOTHYRONINE SODIUM 5 MCG PO TABS
ORAL_TABLET | ORAL | 2 refills | Status: DC
  Filled 2024-03-11: qty 90, 30d supply, fill #0
  Filled 2024-04-09: qty 90, 30d supply, fill #1
  Filled 2024-05-07: qty 90, 30d supply, fill #2

## 2024-03-11 MED ORDER — FLUOXETINE HCL 40 MG PO CAPS
40.0000 mg | ORAL_CAPSULE | Freq: Every day | ORAL | 0 refills | Status: DC
  Filled 2024-03-11: qty 90, 90d supply, fill #0

## 2024-03-12 ENCOUNTER — Other Ambulatory Visit: Payer: Self-pay

## 2024-03-14 ENCOUNTER — Ambulatory Visit
Admission: RE | Admit: 2024-03-14 | Discharge: 2024-03-14 | Disposition: A | Discharge status: Home / Self Care | Source: Ambulatory Visit | Attending: Orthopedic Surgery | Admitting: Orthopedic Surgery

## 2024-03-14 DIAGNOSIS — M5126 Other intervertebral disc displacement, lumbar region: Secondary | ICD-10-CM | POA: Diagnosis not present

## 2024-03-14 DIAGNOSIS — M546 Pain in thoracic spine: Secondary | ICD-10-CM

## 2024-03-14 DIAGNOSIS — M545 Low back pain, unspecified: Secondary | ICD-10-CM

## 2024-03-20 ENCOUNTER — Other Ambulatory Visit (HOSPITAL_COMMUNITY): Payer: Self-pay

## 2024-03-27 ENCOUNTER — Other Ambulatory Visit: Payer: Self-pay | Admitting: Family Medicine

## 2024-03-27 ENCOUNTER — Other Ambulatory Visit (HOSPITAL_COMMUNITY): Payer: Self-pay

## 2024-03-27 DIAGNOSIS — E1169 Type 2 diabetes mellitus with other specified complication: Secondary | ICD-10-CM

## 2024-03-27 MED ORDER — OZEMPIC (2 MG/DOSE) 8 MG/3ML ~~LOC~~ SOPN
2.0000 mg | PEN_INJECTOR | SUBCUTANEOUS | 5 refills | Status: DC
  Filled 2024-03-27: qty 3, 28d supply, fill #0
  Filled 2024-04-21 – 2024-05-06 (×2): qty 3, 28d supply, fill #1
  Filled 2024-06-02: qty 3, 28d supply, fill #2
  Filled 2024-06-25: qty 3, 28d supply, fill #3
  Filled 2024-07-27: qty 3, 28d supply, fill #4
  Filled 2024-08-25: qty 3, 28d supply, fill #5

## 2024-03-31 ENCOUNTER — Other Ambulatory Visit: Payer: Self-pay | Admitting: *Deleted

## 2024-03-31 DIAGNOSIS — E1169 Type 2 diabetes mellitus with other specified complication: Secondary | ICD-10-CM

## 2024-03-31 DIAGNOSIS — E039 Hypothyroidism, unspecified: Secondary | ICD-10-CM

## 2024-04-03 ENCOUNTER — Other Ambulatory Visit

## 2024-04-03 DIAGNOSIS — E039 Hypothyroidism, unspecified: Secondary | ICD-10-CM

## 2024-04-03 DIAGNOSIS — E1169 Type 2 diabetes mellitus with other specified complication: Secondary | ICD-10-CM

## 2024-04-10 ENCOUNTER — Ambulatory Visit: Admitting: Family Medicine

## 2024-04-30 ENCOUNTER — Other Ambulatory Visit (HOSPITAL_COMMUNITY): Payer: Self-pay

## 2024-05-06 ENCOUNTER — Other Ambulatory Visit (HOSPITAL_COMMUNITY): Payer: Self-pay

## 2024-05-07 ENCOUNTER — Other Ambulatory Visit (HOSPITAL_COMMUNITY): Payer: Self-pay

## 2024-05-07 ENCOUNTER — Other Ambulatory Visit: Payer: Self-pay

## 2024-05-07 MED ORDER — GABAPENTIN 100 MG PO CAPS
100.0000 mg | ORAL_CAPSULE | Freq: Every day | ORAL | 0 refills | Status: AC
  Filled 2024-05-07: qty 30, 30d supply, fill #0

## 2024-05-14 ENCOUNTER — Other Ambulatory Visit (HOSPITAL_COMMUNITY): Payer: Self-pay

## 2024-05-20 ENCOUNTER — Other Ambulatory Visit: Payer: Self-pay

## 2024-05-20 ENCOUNTER — Other Ambulatory Visit: Payer: Self-pay | Admitting: Family Medicine

## 2024-05-20 DIAGNOSIS — E1159 Type 2 diabetes mellitus with other circulatory complications: Secondary | ICD-10-CM

## 2024-05-20 DIAGNOSIS — E1169 Type 2 diabetes mellitus with other specified complication: Secondary | ICD-10-CM

## 2024-05-27 NOTE — Telephone Encounter (Signed)
 LVM for pt to call office to see about scheduling an appt.  Please assist her in getting this scheduled if she calls back.

## 2024-05-30 ENCOUNTER — Telehealth: Payer: Self-pay

## 2024-05-30 ENCOUNTER — Other Ambulatory Visit (HOSPITAL_COMMUNITY): Payer: Self-pay

## 2024-05-30 MED ORDER — LISINOPRIL 40 MG PO TABS
40.0000 mg | ORAL_TABLET | Freq: Every day | ORAL | 0 refills | Status: DC
  Filled 2024-05-30: qty 30, 30d supply, fill #0

## 2024-05-30 MED ORDER — EZETIMIBE 10 MG PO TABS
10.0000 mg | ORAL_TABLET | Freq: Every day | ORAL | 0 refills | Status: DC
  Filled 2024-05-30: qty 30, 30d supply, fill #0

## 2024-05-30 NOTE — Telephone Encounter (Signed)
 LVM for a return call to see if now is a good time to reschedule labs and appt with provider that was cx in June.

## 2024-06-02 ENCOUNTER — Other Ambulatory Visit (HOSPITAL_COMMUNITY): Payer: Self-pay

## 2024-06-09 ENCOUNTER — Other Ambulatory Visit: Payer: Self-pay | Admitting: Family Medicine

## 2024-06-09 ENCOUNTER — Encounter (HOSPITAL_COMMUNITY): Payer: Self-pay

## 2024-06-09 ENCOUNTER — Other Ambulatory Visit (HOSPITAL_COMMUNITY): Payer: Self-pay

## 2024-06-09 DIAGNOSIS — E1169 Type 2 diabetes mellitus with other specified complication: Secondary | ICD-10-CM

## 2024-06-09 DIAGNOSIS — F4323 Adjustment disorder with mixed anxiety and depressed mood: Secondary | ICD-10-CM

## 2024-06-09 DIAGNOSIS — E039 Hypothyroidism, unspecified: Secondary | ICD-10-CM

## 2024-06-10 ENCOUNTER — Other Ambulatory Visit: Payer: Self-pay

## 2024-06-10 ENCOUNTER — Other Ambulatory Visit (HOSPITAL_COMMUNITY): Payer: Self-pay

## 2024-06-10 DIAGNOSIS — F4323 Adjustment disorder with mixed anxiety and depressed mood: Secondary | ICD-10-CM

## 2024-06-10 DIAGNOSIS — E039 Hypothyroidism, unspecified: Secondary | ICD-10-CM

## 2024-06-10 MED ORDER — FLUOXETINE HCL 40 MG PO CAPS
40.0000 mg | ORAL_CAPSULE | Freq: Every day | ORAL | 0 refills | Status: DC
  Filled 2024-06-10: qty 90, 90d supply, fill #0

## 2024-06-10 MED ORDER — LIOTHYRONINE SODIUM 5 MCG PO TABS
ORAL_TABLET | ORAL | 2 refills | Status: DC
  Filled 2024-06-10 (×2): qty 90, 30d supply, fill #0
  Filled 2024-07-17: qty 90, 30d supply, fill #1
  Filled 2024-08-14: qty 90, 30d supply, fill #2

## 2024-06-10 MED ORDER — METFORMIN HCL 1000 MG PO TABS
1000.0000 mg | ORAL_TABLET | Freq: Two times a day (BID) | ORAL | 1 refills | Status: AC
  Filled 2024-06-10: qty 180, 90d supply, fill #0
  Filled 2024-09-22: qty 180, 90d supply, fill #1

## 2024-06-10 MED ORDER — GLIPIZIDE 5 MG PO TABS
5.0000 mg | ORAL_TABLET | Freq: Every day | ORAL | 1 refills | Status: DC
  Filled 2024-06-10: qty 90, 90d supply, fill #0

## 2024-06-10 NOTE — Telephone Encounter (Unsigned)
 Copied from CRM #[id number]. Topic: Clinical - Medication Refill >> Jun 10, 2024  9:34 AM Fonda T wrote: Medication: liothyronine  (CYTOMEL ) 5 MCG tablet  FLUoxetine  (PROZAC ) 40 MG capsule    Has the patient contacted their pharmacy? Yes, advise to contact office   This is the patient's preferred pharmacy:  [facility] [address] [place] [address] Phone: [phone] Fax: [phone]    Is this the correct pharmacy for this prescription? Yes If no, delete pharmacy and type the correct one.   Has the prescription been filled recently? Yes  Is the patient out of the medication? Yes  Has the patient been seen for an appointment in the last year OR does the patient have an upcoming appointment? Yes, next appointment scheduled on 07/14/24  Can we respond through MyChart? Yes  Agent: Please be advised that Rx refills may take up to 3 business days. We ask that you follow-up with your pharmacy.

## 2024-06-20 ENCOUNTER — Other Ambulatory Visit: Payer: Self-pay | Admitting: Family Medicine

## 2024-06-20 ENCOUNTER — Other Ambulatory Visit (HOSPITAL_COMMUNITY): Payer: Self-pay

## 2024-06-20 DIAGNOSIS — E1159 Type 2 diabetes mellitus with other circulatory complications: Secondary | ICD-10-CM

## 2024-06-23 ENCOUNTER — Other Ambulatory Visit (HOSPITAL_COMMUNITY): Payer: Self-pay

## 2024-06-23 MED ORDER — LISINOPRIL 40 MG PO TABS
40.0000 mg | ORAL_TABLET | Freq: Every day | ORAL | 0 refills | Status: DC
  Filled 2024-06-23: qty 30, 30d supply, fill #0

## 2024-06-25 ENCOUNTER — Other Ambulatory Visit (HOSPITAL_COMMUNITY): Payer: Self-pay

## 2024-06-25 ENCOUNTER — Other Ambulatory Visit: Payer: Self-pay | Admitting: Family Medicine

## 2024-06-25 ENCOUNTER — Other Ambulatory Visit: Payer: Self-pay

## 2024-06-25 DIAGNOSIS — E1169 Type 2 diabetes mellitus with other specified complication: Secondary | ICD-10-CM

## 2024-06-25 MED ORDER — EZETIMIBE 10 MG PO TABS
10.0000 mg | ORAL_TABLET | Freq: Every day | ORAL | 3 refills | Status: AC
  Filled 2024-06-25: qty 90, 90d supply, fill #0
  Filled 2024-10-20: qty 90, 90d supply, fill #1

## 2024-07-10 ENCOUNTER — Encounter: Payer: Self-pay | Admitting: Hematology

## 2024-07-10 ENCOUNTER — Other Ambulatory Visit

## 2024-07-10 DIAGNOSIS — E039 Hypothyroidism, unspecified: Secondary | ICD-10-CM | POA: Diagnosis not present

## 2024-07-10 DIAGNOSIS — E1169 Type 2 diabetes mellitus with other specified complication: Secondary | ICD-10-CM | POA: Diagnosis not present

## 2024-07-11 ENCOUNTER — Ambulatory Visit: Payer: Self-pay | Admitting: Family Medicine

## 2024-07-11 LAB — HEMOGLOBIN A1C
Est. average glucose Bld gHb Est-mCnc: 171 mg/dL
Hgb A1c MFr Bld: 7.6 % — ABNORMAL HIGH (ref 4.8–5.6)

## 2024-07-11 LAB — TSH: TSH: 0.274 u[IU]/mL — ABNORMAL LOW (ref 0.450–4.500)

## 2024-07-14 ENCOUNTER — Ambulatory Visit

## 2024-07-14 ENCOUNTER — Other Ambulatory Visit (HOSPITAL_COMMUNITY): Payer: Self-pay

## 2024-07-14 VITALS — BP 151/81 | HR 72 | Temp 97.5°F | Ht 65.0 in | Wt 159.0 lb

## 2024-07-14 DIAGNOSIS — E1159 Type 2 diabetes mellitus with other circulatory complications: Secondary | ICD-10-CM | POA: Diagnosis not present

## 2024-07-14 DIAGNOSIS — Z780 Asymptomatic menopausal state: Secondary | ICD-10-CM

## 2024-07-14 DIAGNOSIS — I152 Hypertension secondary to endocrine disorders: Secondary | ICD-10-CM

## 2024-07-14 DIAGNOSIS — Z7984 Long term (current) use of oral hypoglycemic drugs: Secondary | ICD-10-CM | POA: Diagnosis not present

## 2024-07-14 DIAGNOSIS — E039 Hypothyroidism, unspecified: Secondary | ICD-10-CM | POA: Diagnosis not present

## 2024-07-14 DIAGNOSIS — Z23 Encounter for immunization: Secondary | ICD-10-CM

## 2024-07-14 DIAGNOSIS — Z1231 Encounter for screening mammogram for malignant neoplasm of breast: Secondary | ICD-10-CM | POA: Diagnosis not present

## 2024-07-14 DIAGNOSIS — E785 Hyperlipidemia, unspecified: Secondary | ICD-10-CM

## 2024-07-14 DIAGNOSIS — E1169 Type 2 diabetes mellitus with other specified complication: Secondary | ICD-10-CM | POA: Diagnosis not present

## 2024-07-14 MED ORDER — SYNTHROID 75 MCG PO TABS
75.0000 ug | ORAL_TABLET | Freq: Every day | ORAL | 1 refills | Status: AC
  Filled 2024-07-14: qty 90, 90d supply, fill #0
  Filled 2024-10-20: qty 90, 90d supply, fill #1

## 2024-07-14 MED ORDER — HYDROCHLOROTHIAZIDE 50 MG PO TABS
50.0000 mg | ORAL_TABLET | Freq: Every day | ORAL | 1 refills | Status: AC
  Filled 2024-07-14 – 2024-08-25 (×2): qty 90, 90d supply, fill #0

## 2024-07-14 MED ORDER — GLIPIZIDE 5 MG PO TABS
5.0000 mg | ORAL_TABLET | Freq: Two times a day (BID) | ORAL | 1 refills | Status: AC
  Filled 2024-07-14 – 2024-08-25 (×3): qty 90, 45d supply, fill #0
  Filled 2024-10-20: qty 90, 45d supply, fill #1

## 2024-07-14 MED ORDER — LISINOPRIL 40 MG PO TABS
40.0000 mg | ORAL_TABLET | Freq: Every day | ORAL | 1 refills | Status: AC
  Filled 2024-07-14 – 2024-08-14 (×2): qty 90, 90d supply, fill #0
  Filled 2024-11-17: qty 90, 90d supply, fill #1

## 2024-07-14 NOTE — Assessment & Plan Note (Signed)
 A1c 7.6, above goal of <7. Continue Ozempic  2 mg weekly, Metformin  1000 mg BID. Will increase Glipizide  from 5 mg every day to 5 mg BID. Will plan to recheck A1c in 3 months.

## 2024-07-14 NOTE — Assessment & Plan Note (Signed)
 TSH at 0.2, unchanged. Symptoms include fatigue. - Decrease levothyroxine  to 75 mcg daily. - Continue Cytomel  as prescribed. - Recheck thyroid  function tests in 12 weeks. - Send levothyroxine  prescription as brand name Synthroid .

## 2024-07-14 NOTE — Progress Notes (Signed)
 Established Patient Office Visit  Subjective   Patient ID: Robin Greene, female    DOB: May 19, 1956  Age: 68 y.o. MRN: [id number]  Chief Complaint  Patient presents with   Medical Management of Chronic Issues    HPI History of Present Illness   Robin Greene is a 68 year old female with type 2 diabetes and thyroid  dysfunction who presents for follow-up to check A1c, thyroid  function, and medication refills.  Glycemic control - Type 2 diabetes mellitus with A1c improved from 10.6 to 7.6 over the past six months - Current medications: Ozempic  2 mg weekly, metformin  2000 mg daily, glipizide  once daily - No episodes of hypoglycemia - Weight loss of 30 pounds with subsequent regain of 10 pounds  Gastrointestinal symptoms - Chronic diarrhea began after a vacation two years ago, associated with a hemoglobin drop to 8 requiring infusion - Symptoms improved with dietary changes and probiotics - Suspects metformin  may have contributed to diarrhea, but symptoms have improved over the last month  Thyroid  dysfunction and associated symptoms - Variable thyroid  function with TSH nearly 10 in December, currently 0.2 - Current medications: Synthroid  88 mcg daily, Cytomel  (two in the morning, one at night) - Fatigue requiring naps between 4 and 5 PM - Significant improvement in cognitive function with Cytomel   Hypertension - Currently taking lisinopril  40 mg daily and hydrochlorothiazide  50 mg  - Elevated blood pressure possibly related to caregiving responsibilities for her 17 year old father  Hyperlipidemia - Currently taking ezetimibe  for cholesterol management  Osteopenia - History of osteopenia, last bone density scan in 2019 - Due for repeat bone density scan  Preventive care - Due for mammogram - Recent eye exam in September 2024          ROS Per HPI.    Objective:     BP (!) 151/81   Pulse 72   Temp (!) 97.5 F (36.4 C) (Oral)   Ht 5' 5 (1.651 m)   Wt 159 lb 0.6 oz  (72.1 kg)   LMP  (LMP Unknown)   SpO2 99%   BMI 26.47 kg/m    Physical Exam Constitutional:      General: She is not in acute distress.    Appearance: Normal appearance.  Cardiovascular:     Rate and Rhythm: Normal rate and regular rhythm.     Heart sounds: Normal heart sounds. No murmur heard.    No friction rub. No gallop.  Pulmonary:     Effort: Pulmonary effort is normal. No respiratory distress.     Breath sounds: Normal breath sounds.  Musculoskeletal:        General: No swelling.  Skin:    General: Skin is warm and dry.  Neurological:     General: No focal deficit present.     Mental Status: She is alert.  Psychiatric:        Mood and Affect: Mood normal.        Behavior: Behavior normal.        Thought Content: Thought content normal.      No results found for any visits on 07/14/24.  Last metabolic panel Lab Results  Component Value Date   GLUCOSE 271 (H) 10/05/2023   NA 132 (L) 10/05/2023   K 3.7 10/05/2023   CL 90 (L) 10/05/2023   CO2 25 10/05/2023   BUN 9 10/05/2023   CREATININE 0.71 10/05/2023   EGFR 92 10/05/2023   CALCIUM 9.7 10/05/2023   PHOS 4.1 04/04/2018  PROT 7.0 10/05/2023   ALBUMIN 4.3 10/05/2023   LABGLOB 2.7 10/05/2023   AGRATIO 1.6 01/05/2023   BILITOT 0.3 10/05/2023   ALKPHOS 107 10/05/2023   AST 33 10/05/2023   ALT 28 10/05/2023   ANIONGAP 13 11/18/2021   Last lipids Lab Results  Component Value Date   CHOL 159 01/03/2024   HDL 50 01/03/2024   LDLCALC 80 01/03/2024   TRIG 170 (H) 01/03/2024   CHOLHDL 3.2 01/03/2024   Last hemoglobin A1c Lab Results  Component Value Date   HGBA1C 7.6 (H) 07/10/2024   Last thyroid  functions Lab Results  Component Value Date   TSH 0.274 (L) 07/10/2024   T3TOTAL 192 (H) 06/30/2021      The 10-year ASCVD risk score (Arnett DK, et al., 2019) is: 29.2%    Assessment & Plan:   Hyperlipidemia associated with type 2 diabetes mellitus (HCC) Assessment & Plan: Updating lipid panel  with labs. The 10-year ASCVD risk score (Arnett DK, et al., 2019) is: 29.2% Continue Zetia  10 mg daily.   Type 2 diabetes mellitus with other specified complication, without long-term current use of insulin  (HCC) Assessment & Plan: A1c 7.6, above goal of <7. Continue Ozempic  2 mg weekly, Metformin  1000 mg BID. Will increase Glipizide  from 5 mg every day to 5 mg BID. Will plan to recheck A1c in 3 months.   Orders: -     glipiZIDE ; Take 1 tablet (5 mg total) by mouth 2 (two) times daily before a meal.  Dispense: 90 tablet; Refill: 1 -     Hemoglobin A1c; Future -     Lipid panel; Future -     Comprehensive metabolic panel with GFR; Future  Hypertension associated with diabetes (HCC)-  with superimposed white coat HTN Assessment & Plan: BP Goal <130/80. Bp above goal in office today and on recheck. Attributed to recent stress. Advised her to continue checking regularly at home and notify of readings persistently above goal. Continue Lisinopril  40 mg and hydrochlorothiazide  50 mg daily. If this proves ineffective, consider adding amlodipine 5 mg at next visit.  Orders: -     Lisinopril ; Take 1 tablet (40 mg total) by mouth daily.  Dispense: 90 tablet; Refill: 1 -     hydroCHLOROthiazide ; Take 1 tablet (50 mg total) by mouth daily.  Dispense: 90 tablet; Refill: 1 -     TSH; Future -     CBC with Differential/Platelet; Future  Screening mammogram for breast cancer -     3D Screening Mammogram, Left and Right; Future  Postmenopausal estrogen deficiency -     DG Bone Density; Future -     VITAMIN D  25 Hydroxy (Vit-D Deficiency, Fractures); Future  Encounter for vaccination -     Flu vaccine HIGH DOSE PF(Fluzone Trivalent)  Hypothyroidism, unspecified type Assessment & Plan: TSH at 0.2, unchanged. Symptoms include fatigue. - Decrease levothyroxine  to 75 mcg daily. - Continue Cytomel  as prescribed. - Recheck thyroid  function tests in 12 weeks. - Send levothyroxine  prescription as brand  name Synthroid .   Other orders -     Synthroid ; Take 1 tablet (75 mcg total) by mouth daily before breakfast.  Dispense: 90 tablet; Refill: 1    Return in about 6 months (around 01/11/2025) for HTN, HLD, DM.    Robin JULIANNA Sacks, PA-C

## 2024-07-14 NOTE — Assessment & Plan Note (Signed)
 BP Goal <130/80. Bp above goal in office today and on recheck. Attributed to recent stress. Advised her to continue checking regularly at home and notify of readings persistently above goal. Continue Lisinopril  40 mg and hydrochlorothiazide  50 mg daily. If this proves ineffective, consider adding amlodipine 5 mg at next visit.

## 2024-07-14 NOTE — Assessment & Plan Note (Signed)
 Updating lipid panel with labs. The 10-year ASCVD risk score (Arnett DK, et al., 2019) is: 29.2% Continue Zetia  10 mg daily.

## 2024-07-14 NOTE — Patient Instructions (Signed)
 VISIT SUMMARY: Today, you came in for a follow-up visit to check your A1c, thyroid  function, and to get medication refills. We discussed your diabetes management, thyroid  function, blood pressure, gastrointestinal symptoms, and preventive care.  YOUR PLAN: -TYPE 2 DIABETES MELLITUS: Type 2 diabetes is a condition where your body does not use insulin  properly, leading to high blood sugar levels. Your A1c has improved to 7.6 from 10.6, which is great progress. Continue taking Ozempic  2 mg weekly and metformin  2000 mg daily. Increase glipizide  to twice daily. Monitor your blood glucose and report any low blood sugar episodes. We will recheck your A1c in 12 weeks.  -IATROGENIC HYPERTHYROIDISM: Iatrogenic hyperthyroidism is an overactive thyroid  caused by too much thyroid  medication. Your TSH level is currently 0.2. We will decrease your levothyroxine  to 75 mcg daily but continue Cytomel  as prescribed. We will recheck your thyroid  function tests in 12 weeks. Your levothyroxine  prescription will be sent as the brand name Synthroid .  -HYPERTENSION: Hypertension is high blood pressure. Continue taking lisinopril  40 mg daily and hydrochlorothiazide  as prescribed. Your lisinopril  prescription will be sent for 90 days with refills.  -DIARRHEA LIKELY SECONDARY TO METFORMIN : Your intermittent diarrhea may be caused by metformin . Since your symptoms have improved with dietary changes and probiotics, we will monitor for recurrence. If diarrhea returns, we may consider switching you to extended-release metformin .  -GENERAL HEALTH MAINTENANCE: You are due for a mammogram and a repeat bone density scan. Your recent eye exam was in September 2024.  INSTRUCTIONS: Please follow up in 12 weeks for rechecking your A1c and thyroid  function tests. Continue monitoring your blood glucose and report any episodes of low blood sugar. If diarrhea recurs, let us  know so we can consider switching you to extended-release  metformin .  If you have any problems before your next visit feel free to message me via MyChart (minor issues or questions) or call the office, otherwise you may reach out to schedule an office visit.  Thank you! Saddie Sacks, PA-C

## 2024-08-05 ENCOUNTER — Encounter: Payer: Self-pay | Admitting: Hematology

## 2024-08-05 ENCOUNTER — Ambulatory Visit
Admission: RE | Admit: 2024-08-05 | Discharge: 2024-08-05 | Disposition: A | Discharge status: Home / Self Care | Source: Ambulatory Visit

## 2024-08-05 DIAGNOSIS — Z1231 Encounter for screening mammogram for malignant neoplasm of breast: Secondary | ICD-10-CM

## 2024-08-08 ENCOUNTER — Ambulatory Visit: Payer: Self-pay

## 2024-08-11 ENCOUNTER — Other Ambulatory Visit: Payer: Self-pay

## 2024-08-11 DIAGNOSIS — R928 Other abnormal and inconclusive findings on diagnostic imaging of breast: Secondary | ICD-10-CM

## 2024-08-14 ENCOUNTER — Other Ambulatory Visit (HOSPITAL_COMMUNITY): Payer: Self-pay

## 2024-08-15 ENCOUNTER — Other Ambulatory Visit (HOSPITAL_COMMUNITY): Payer: Self-pay

## 2024-08-15 ENCOUNTER — Other Ambulatory Visit: Payer: Self-pay

## 2024-08-22 ENCOUNTER — Other Ambulatory Visit: Payer: Self-pay

## 2024-08-22 ENCOUNTER — Ambulatory Visit
Admission: RE | Admit: 2024-08-22 | Discharge: 2024-08-22 | Disposition: A | Discharge status: Home / Self Care | Source: Ambulatory Visit

## 2024-08-22 DIAGNOSIS — R921 Mammographic calcification found on diagnostic imaging of breast: Secondary | ICD-10-CM | POA: Diagnosis not present

## 2024-08-22 DIAGNOSIS — R928 Other abnormal and inconclusive findings on diagnostic imaging of breast: Secondary | ICD-10-CM

## 2024-08-25 ENCOUNTER — Other Ambulatory Visit (HOSPITAL_COMMUNITY): Payer: Self-pay

## 2024-08-25 ENCOUNTER — Other Ambulatory Visit: Payer: Self-pay

## 2024-08-27 ENCOUNTER — Ambulatory Visit
Admission: RE | Admit: 2024-08-27 | Discharge: 2024-08-27 | Disposition: A | Discharge status: Home / Self Care | Source: Ambulatory Visit

## 2024-08-27 DIAGNOSIS — R921 Mammographic calcification found on diagnostic imaging of breast: Secondary | ICD-10-CM | POA: Diagnosis not present

## 2024-08-27 HISTORY — PX: BREAST BIOPSY: SHX20

## 2024-08-28 ENCOUNTER — Ambulatory Visit

## 2024-08-28 LAB — SURGICAL PATHOLOGY

## 2024-08-30 ENCOUNTER — Other Ambulatory Visit (HOSPITAL_COMMUNITY): Payer: Self-pay

## 2024-09-18 ENCOUNTER — Other Ambulatory Visit: Payer: Self-pay

## 2024-09-18 ENCOUNTER — Other Ambulatory Visit (HOSPITAL_COMMUNITY): Payer: Self-pay

## 2024-09-18 DIAGNOSIS — F4323 Adjustment disorder with mixed anxiety and depressed mood: Secondary | ICD-10-CM

## 2024-09-18 DIAGNOSIS — E039 Hypothyroidism, unspecified: Secondary | ICD-10-CM

## 2024-09-18 MED ORDER — LIOTHYRONINE SODIUM 5 MCG PO TABS
ORAL_TABLET | ORAL | 2 refills | Status: AC
  Filled 2024-09-18: qty 90, 30d supply, fill #0
  Filled 2024-10-20: qty 90, 30d supply, fill #1
  Filled 2024-11-17: qty 90, 30d supply, fill #2

## 2024-09-18 MED ORDER — FLUOXETINE HCL 40 MG PO CAPS
40.0000 mg | ORAL_CAPSULE | Freq: Every day | ORAL | 0 refills | Status: AC
  Filled 2024-09-18: qty 90, 90d supply, fill #0

## 2024-09-22 ENCOUNTER — Other Ambulatory Visit (HOSPITAL_COMMUNITY): Payer: Self-pay

## 2024-09-22 ENCOUNTER — Other Ambulatory Visit: Payer: Self-pay | Admitting: Family Medicine

## 2024-09-22 ENCOUNTER — Other Ambulatory Visit: Payer: Self-pay

## 2024-09-22 DIAGNOSIS — E1169 Type 2 diabetes mellitus with other specified complication: Secondary | ICD-10-CM

## 2024-09-22 MED ORDER — OZEMPIC (2 MG/DOSE) 8 MG/3ML ~~LOC~~ SOPN
2.0000 mg | PEN_INJECTOR | SUBCUTANEOUS | 5 refills | Status: AC
  Filled 2024-09-22: qty 3, 28d supply, fill #0
  Filled 2024-10-20: qty 3, 28d supply, fill #1
  Filled 2024-11-17: qty 3, 28d supply, fill #2

## 2024-10-06 ENCOUNTER — Other Ambulatory Visit

## 2024-10-07 LAB — LIPID PANEL
Chol/HDL Ratio: 3.2 ratio (ref 0.0–4.4)
Cholesterol, Total: 171 mg/dL (ref 100–199)
HDL: 54 mg/dL
LDL Chol Calc (NIH): 87 mg/dL (ref 0–99)
Triglycerides: 174 mg/dL — ABNORMAL HIGH (ref 0–149)
VLDL Cholesterol Cal: 30 mg/dL (ref 5–40)

## 2024-10-07 LAB — CBC WITH DIFFERENTIAL/PLATELET
Basophils Absolute: 0.1 x10E3/uL (ref 0.0–0.2)
Basos: 1 %
EOS (ABSOLUTE): 0.6 x10E3/uL — ABNORMAL HIGH (ref 0.0–0.4)
Eos: 11 %
Hematocrit: 35.4 % (ref 34.0–46.6)
Hemoglobin: 11.5 g/dL (ref 11.1–15.9)
Immature Grans (Abs): 0 x10E3/uL (ref 0.0–0.1)
Immature Granulocytes: 0 %
Lymphocytes Absolute: 1.4 x10E3/uL (ref 0.7–3.1)
Lymphs: 26 %
MCH: 29 pg (ref 26.6–33.0)
MCHC: 32.5 g/dL (ref 31.5–35.7)
MCV: 89 fL (ref 79–97)
Monocytes Absolute: 0.4 x10E3/uL (ref 0.1–0.9)
Monocytes: 8 %
Neutrophils Absolute: 2.8 x10E3/uL (ref 1.4–7.0)
Neutrophils: 54 %
Platelets: 343 x10E3/uL (ref 150–450)
RBC: 3.96 x10E6/uL (ref 3.77–5.28)
RDW: 13.4 % (ref 11.7–15.4)
WBC: 5.3 x10E3/uL (ref 3.4–10.8)

## 2024-10-07 LAB — HEMOGLOBIN A1C
Est. average glucose Bld gHb Est-mCnc: 154 mg/dL
Hgb A1c MFr Bld: 7 % — ABNORMAL HIGH (ref 4.8–5.6)

## 2024-10-07 LAB — COMPREHENSIVE METABOLIC PANEL WITH GFR
ALT: 39 IU/L — ABNORMAL HIGH (ref 0–32)
AST: 37 IU/L (ref 0–40)
Albumin: 4.2 g/dL (ref 3.9–4.9)
Alkaline Phosphatase: 75 IU/L (ref 49–135)
BUN/Creatinine Ratio: 15 (ref 12–28)
BUN: 14 mg/dL (ref 8–27)
Bilirubin Total: 0.4 mg/dL (ref 0.0–1.2)
CO2: 27 mmol/L (ref 20–29)
Calcium: 10.2 mg/dL (ref 8.7–10.3)
Chloride: 97 mmol/L (ref 96–106)
Creatinine, Ser: 0.92 mg/dL (ref 0.57–1.00)
Globulin, Total: 2.1 g/dL (ref 1.5–4.5)
Glucose: 126 mg/dL — ABNORMAL HIGH (ref 70–99)
Potassium: 4.1 mmol/L (ref 3.5–5.2)
Sodium: 138 mmol/L (ref 134–144)
Total Protein: 6.3 g/dL (ref 6.0–8.5)
eGFR: 67 mL/min/1.73

## 2024-10-07 LAB — VITAMIN D 25 HYDROXY (VIT D DEFICIENCY, FRACTURES): Vit D, 25-Hydroxy: 37.4 ng/mL (ref 30.0–100.0)

## 2024-10-07 LAB — TSH: TSH: 0.443 u[IU]/mL — ABNORMAL LOW (ref 0.450–4.500)

## 2024-10-21 ENCOUNTER — Other Ambulatory Visit (HOSPITAL_COMMUNITY): Payer: Self-pay

## 2024-10-21 ENCOUNTER — Other Ambulatory Visit: Payer: Self-pay

## 2024-10-23 ENCOUNTER — Ambulatory Visit

## 2024-10-23 VITALS — Ht 65.0 in | Wt 149.0 lb

## 2024-10-23 DIAGNOSIS — Z0000 Encounter for general adult medical examination without abnormal findings: Secondary | ICD-10-CM | POA: Diagnosis not present

## 2024-10-23 DIAGNOSIS — Z780 Asymptomatic menopausal state: Secondary | ICD-10-CM | POA: Diagnosis not present

## 2024-10-23 NOTE — Patient Instructions (Addendum)
 Ms. Spies,  Thank you for taking the time for your Medicare Wellness Visit. I appreciate your continued commitment to your health goals. Please review the care plan we discussed, and feel free to reach out if I can assist you further.  Please note that Annual Wellness Visits do not include a physical exam. Some assessments may be limited, especially if the visit was conducted virtually. If needed, we may recommend an in-person follow-up with your provider.  Ongoing Care Seeing your primary care provider every 3 to 6 months helps us  monitor your health and provide consistent, personalized care.   Referrals If a referral was made during today's visit and you haven't received any updates within two weeks, please contact the referred provider directly to check on the status.  Recommended Screenings:  Health Maintenance  Topic Date Due   Osteoporosis screening with Bone Density Scan  07/22/2021   Pneumococcal Vaccine for age over 51 (3 of 3 - PCV20 or PCV21) 09/19/2022   Complete foot exam   01/05/2024   DTaP/Tdap/Td vaccine (3 - Td or Tdap) 05/20/2024   COVID-19 Vaccine (1 - 2025-26 season) Never done   Yearly kidney health urinalysis for diabetes  01/08/2025   Hemoglobin A1C  04/06/2025   Eye exam for diabetics  07/07/2025   Yearly kidney function blood test for diabetes  10/06/2025   Medicare Annual Wellness Visit  10/23/2025   Breast Cancer Screening  08/22/2026   Colon Cancer Screening  07/09/2031   Flu Shot  Completed   Hepatitis C Screening  Completed   Zoster (Shingles) Vaccine  Completed   Meningitis B Vaccine  Aged Out       10/23/2024    3:30 PM  Advanced Directives  Does Patient Have a Medical Advance Directive? No  Would patient like information on creating a medical advance directive? No - Patient declined    Vision: Annual vision screenings are recommended for early detection of glaucoma, cataracts, and diabetic retinopathy. These exams can also reveal signs of  chronic conditions such as diabetes and high blood pressure.  Dental: Annual dental screenings help detect early signs of oral cancer, gum disease, and other conditions linked to overall health, including heart disease and diabetes.

## 2024-10-23 NOTE — Progress Notes (Signed)
 "  Chief Complaint  Patient presents with   Medicare Wellness     Subjective:   Robin Greene is a 69 y.o. female who presents for a Medicare Annual Wellness Visit.  Visit info / Clinical Intake: Medicare Wellness Visit Type:: Initial Annual Wellness Visit Persons participating in visit and providing information:: patient Medicare Wellness Visit Mode:: Telephone If telephone:: video declined Since this visit was completed virtually, some vitals may be partially provided or unavailable. Missing vitals are due to the limitations of the virtual format.: Documented vitals are patient reported If Telephone or Video please confirm:: I connected with patient using audio/video enable telemedicine. I verified patient identity with two identifiers, discussed telehealth limitations, and patient agreed to proceed. Patient Location:: Home Provider Location:: Office Interpreter Needed?: No Pre-visit prep was completed: yes AWV questionnaire completed by patient prior to visit?: no Living arrangements:: lives with spouse/significant other Patient's Overall Health Status Rating: good Typical amount of pain: none Does pain affect daily life?: no Are you currently prescribed opioids?: no  Dietary Habits and Nutritional Risks How many meals a day?: 2 Eats fruit and vegetables daily?: yes Most meals are obtained by: preparing own meals; eating out In the last 2 weeks, have you had any of the following?: none Diabetic:: (!) yes Any non-healing wounds?: no How often do you check your BS?: 1; as needed Would you like to be referred to a Nutritionist or for Diabetic Management? : no  Functional Status Activities of Daily Living (to include ambulation/medication): Independent Ambulation: Independent with device- listed below Home Assistive Devices/Equipment: Eyeglasses Medication Administration: Independent Home Management (perform basic housework or laundry): Independent Manage your own finances?:  yes Primary transportation is: driving Concerns about vision?: no *vision screening is required for WTM* Concerns about hearing?: no  Fall Screening Falls in the past year?: 0 Number of falls in past year: 0 Was there an injury with Fall?: 0 Fall Risk Category Calculator: 0 Patient Fall Risk Level: Low Fall Risk  Fall Risk Patient at Risk for Falls Due to: No Fall Risks Fall risk Follow up: Falls evaluation completed; Falls prevention discussed  Home and Transportation Safety: All rugs have non-skid backing?: N/A, no rugs All stairs or steps have railings?: yes Grab bars in the bathtub or shower?: (!) no Have non-skid surface in bathtub or shower?: yes Good home lighting?: yes Regular seat belt use?: yes Hospital stays in the last year:: no  Cognitive Assessment Difficulty concentrating, remembering, or making decisions? : no Will 6CIT or Mini Cog be Completed: yes What year is it?: 0 points What month is it?: 0 points Give patient an address phrase to remember (5 components): [address] [place], [place] About what time is it?: 0 points Count backwards from 20 to 1: 0 points Say the months of the year in reverse: 0 points Repeat the address phrase from earlier: 0 points 6 CIT Score: 0 points  Advance Directives (For Healthcare) Does Patient Have a Medical Advance Directive?: No Would patient like information on creating a medical advance directive?: No - Patient declined  Reviewed/Updated  Reviewed/Updated: Reviewed All (Medical, Surgical, Family, Medications, Allergies, Care Teams, Patient Goals)    Allergies (verified) Asa [aspirin], Crestor [rosuvastatin calcium], Claritin [loratadine], Invokana [canagliflozin], Jardiance [empagliflozin], Lipitor [atorvastatin], and Simvastatin   Current Medications (verified) Outpatient Encounter Medications as of 10/23/2024  Medication Sig   albuterol  (VENTOLIN  HFA) 108 (90 Base) MCG/ACT inhaler INHALE 2 PUFFS INTO THE LUNGS  EVERY 6 HOURS AS NEEDED FOR WHEEZE  Blood Glucose Monitoring Suppl (BLOOD GLUCOSE MONITOR SYSTEM) w/Device KIT Use to check blood sugar in the morning, at noon, and at bedtime.   Blood Pressure Monitoring (OMRON 3 SERIES BP MONITOR) DEVI Use as directed   clobetasol  cream (TEMOVATE ) 0.05 % Apply topically 2 (two) times daily. Maximum 2 weeks in a row.   ezetimibe  (ZETIA ) 10 MG tablet Take 1 tablet (10 mg total) by mouth daily.   FLUoxetine  (PROZAC ) 40 MG capsule Take 1 capsule (40 mg total) by mouth daily. Need follow up appt for refills   fluticasone (FLONASE) 50 MCG/ACT nasal spray Place 1 spray into both nostrils daily as needed for allergies or rhinitis.   gabapentin  (NEURONTIN ) 100 MG capsule Take 1 capsule (100 mg total) by mouth at bedtime.   glipiZIDE  (GLUCOTROL ) 5 MG tablet Take 1 tablet (5 mg total) by mouth 2 (two) times daily before a meal.   Glucose Blood (BLOOD GLUCOSE TEST STRIPS) STRP Use to check blood sugar in the morning, at noon, and at bedtime.   hydrochlorothiazide  (HYDRODIURIL ) 50 MG tablet Take 1 tablet (50 mg total) by mouth daily.   Lancets MISC Use to check blood glucose daily   liothyronine  (CYTOMEL ) 5 MCG tablet Take 2 tablets by mouth each morning and 1 tablet daily in the afternoon.   lisinopril  (ZESTRIL ) 40 MG tablet Take 1 tablet (40 mg total) by mouth daily.   metFORMIN  (GLUCOPHAGE ) 1000 MG tablet Take 1 tablet (1,000 mg total) by mouth 2 (two) times daily with a meal.   Semaglutide , 2 MG/DOSE, (OZEMPIC , 2 MG/DOSE,) 8 MG/3ML SOPN Inject 2 mg as directed once a week.   SYNTHROID  75 MCG tablet Take 1 tablet (75 mcg total) by mouth daily before breakfast.   valACYclovir  (VALTREX ) 500 MG tablet Take 1 tablet (500 mg total) by mouth 2 (two) times daily for 3 to 5 days as needed for flares.   conjugated estrogens  (PREMARIN ) vaginal cream Place 1 Applicatorful vaginally daily.   No facility-administered encounter medications on file as of 10/23/2024.    History: Past  Medical History:  Diagnosis Date   Anemia    Arthritis    Complication of anesthesia    Depression 2022   Diabetes mellitus    dx 2010   GERD (gastroesophageal reflux disease)    occasional   will take OTC   History of hiatal hernia 2022   HSV-1 infection    Hypertension    Hypothyroidism 1990   PONV (postoperative nausea and vomiting)    Vitamin D  deficiency    Past Surgical History:  Procedure Laterality Date   ABDOMINAL HYSTERECTOMY     BREAST BIOPSY Left 08/27/2024   MM LT BREAST BX W LOC DEV 1ST LESION IMAGE BX SPEC STEREO GUIDE 08/27/2024 [facility]   EYE SURGERY     INSERTION OF MESH N/A 04/30/2017   Procedure: INSERTION OF MESH;  Surgeon: Lily Boas, MD;  Location: [facility] OR;  Service: General;  Laterality: N/A;   INSERTION OF MESH N/A 09/13/2021   Procedure: INSERTION OF MESH;  Surgeon: Rubin Calamity, MD;  Location: [facility];  Service: General;  Laterality: N/A;   OVARY SURGERY     REFRACTIVE SURGERY     UMBILICAL HERNIA REPAIR N/A 04/30/2017   Procedure: UMBILICAL HERNIA REPAIR WITH MESH;  Surgeon: Lily Boas, MD;  Location: [facility] OR;  Service: General;  Laterality: N/A;   XI ROBOTIC ASSISTED HIATAL HERNIA REPAIR N/A 09/13/2021   Procedure: XI ROBOTIC ASSISTED HIATAL HERNIA REPAIR WITH FUNDOLPLICATION  WITH MESH;  Surgeon: Rubin Calamity, MD;  Location: [facility];  Service: General;  Laterality: N/A;   Family History  Problem Relation Age of Onset   Sjogren's syndrome Mother    Parkinson's disease Mother    Hypertension Father    Hyperlipidemia Father    Healthy Sister    Social History   Occupational History   Not on file  Tobacco Use   Smoking status: Never    Passive exposure: Never   Smokeless tobacco: Never  Vaping Use   Vaping status: Never Used  Substance and Sexual Activity   Alcohol use: Yes    Alcohol/week: 1.0 standard drink of alcohol    Types: 1 Glasses of wine per week    Comment: occasionally, a glass of wine a couple of times a  month   Drug use: No   Sexual activity: Yes    Birth control/protection: Surgical   Tobacco Counseling Counseling given: No  SDOH Screenings   Food Insecurity: No Food Insecurity (10/23/2024)  Housing: Unknown (10/23/2024)  Transportation Needs: No Transportation Needs (10/23/2024)  Utilities: Not At Risk (10/23/2024)  Alcohol Screen: Low Risk (08/24/2024)  Depression (PHQ2-9): Low Risk (10/23/2024)  Financial Resource Strain: Low Risk (08/24/2024)  Physical Activity: Inactive (10/23/2024)  Social Connections: Moderately Integrated (10/23/2024)  Stress: Stress Concern Present (10/23/2024)  Tobacco Use: Low Risk (10/23/2024)  Health Literacy: Adequate Health Literacy (10/23/2024)   See flowsheets for full screening details  Depression Screen PHQ 2 & 9 Depression Scale- Over the past 2 weeks, how often have you been bothered by any of the following problems? Little interest or pleasure in doing things: 0 Feeling down, depressed, or hopeless (PHQ Adolescent also includes...irritable): 0 PHQ-2 Total Score: 0 Trouble falling or staying asleep, or sleeping too much: 0 Feeling tired or having little energy: 0 Poor appetite or overeating (PHQ Adolescent also includes...weight loss): 0 Feeling bad about yourself - or that you are a failure or have let yourself or your family down: 0 Trouble concentrating on things, such as reading the newspaper or watching television (PHQ Adolescent also includes...like school work): 0 Moving or speaking so slowly that other people could have noticed. Or the opposite - being so fidgety or restless that you have been moving around a lot more than usual: 0 Thoughts that you would be better off dead, or of hurting yourself in some way: 0 PHQ-9 Total Score: 0 If you checked off any problems, how difficult have these problems made it for you to do your work, take care of things at home, or get along with other people?: Not difficult at all  Depression Treatment Depression  Interventions/Treatment : EYV7-0 Score <4 Follow-up Not Indicated     Goals Addressed               This Visit's Progress     Patient Stated (pt-stated)        Patient stated she plans to exercise but is now caring for Father. Continues to check B and reduce sugar intake.             Objective:    Today's Vitals   10/23/24 1528  Weight: 149 lb (67.6 kg)  Height: 5' 5 (1.651 m)   Body mass index is 24.79 kg/m.  Hearing/Vision screen Hearing Screening - Comments:: Denies hearing difficulties   Vision Screening - Comments:: Wears rx glasses - up to date with routine eye exams with [facility] Immunizations and Health Maintenance Health Maintenance  Topic  Date Due   Bone Density Scan  07/22/2021   Pneumococcal Vaccine: 50+ Years (3 of 3 - PCV20 or PCV21) 09/19/2022   FOOT EXAM  01/05/2024   DTaP/Tdap/Td (3 - Td or Tdap) 05/20/2024   COVID-19 Vaccine (1 - 2025-26 season) Never done   Diabetic kidney evaluation - Urine ACR  01/08/2025   HEMOGLOBIN A1C  04/06/2025   OPHTHALMOLOGY EXAM  07/07/2025   Diabetic kidney evaluation - eGFR measurement  10/06/2025   Medicare Annual Wellness (AWV)  10/23/2025   Mammogram  08/22/2026   Colonoscopy  07/09/2031   Influenza Vaccine  Completed   Hepatitis C Screening  Completed   Zoster Vaccines- Shingrix   Completed   Meningococcal B Vaccine  Aged Out        Assessment/Plan:  This is a routine wellness examination for Robin Greene.  DEXA Scan: ordered today  Patient Care Team: Gayle Saddie JULIANNA DEVONNA as PCP - General (Physician Assistant) Donnald Charleston, MD as Consulting Physician (Gastroenterology) Faythe Purchase, MD as Consulting Physician (Endocrinology) Ivin Kocher, MD as Consulting Physician (Dermatology) Leva Rush, MD as Consulting Physician (Obstetrics and Gynecology) [place], Heather, OD [facility])  I have personally reviewed and noted the following in the patients chart:   Medical and social history Use of  alcohol, tobacco or illicit drugs  Current medications and supplements including opioid prescriptions. Functional ability and status Nutritional status Physical activity Advanced directives List of other physicians Hospitalizations, surgeries, and ER visits in previous 12 months Vitals Screenings to include cognitive, depression, and falls Referrals and appointments  Orders Placed This Encounter  Procedures   DG Bone Density    Standing Status:   Future    Expiration Date:   10/23/2025    Reason for Exam (SYMPTOM  OR DIAGNOSIS REQUIRED):   post menopausal estrogen deficient    Preferred imaging location?:   [address]   In addition, I have reviewed and discussed with patient certain preventive protocols, quality metrics, and best practice recommendations. A written personalized care plan for preventive services as well as general preventive health recommendations were provided to patient.   Robin Greene, CMA   10/23/2024   Return in 1 year (on 10/23/2025).  After Visit Summary: (MyChart) Due to this being a telephonic visit, the after visit summary with patients personalized plan was offered to patient via MyChart   Nurse Notes: scheduled 2027 AWV appt "

## 2025-01-09 ENCOUNTER — Ambulatory Visit

## 2025-01-12 ENCOUNTER — Ambulatory Visit

## 2025-11-05 ENCOUNTER — Ambulatory Visit
